# Patient Record
Sex: Female | Born: 1935
Health system: Southern US, Community
[De-identification: ages and names within clinical notes are randomized; demographics above are authoritative.]

## PROBLEM LIST (undated history)

## (undated) DIAGNOSIS — R5381 Other malaise: Secondary | ICD-10-CM

## (undated) DIAGNOSIS — M25562 Pain in left knee: Secondary | ICD-10-CM

## (undated) DIAGNOSIS — N393 Stress incontinence (female) (male): Secondary | ICD-10-CM

## (undated) DIAGNOSIS — I998 Other disorder of circulatory system: Secondary | ICD-10-CM

## (undated) DIAGNOSIS — S91009A Unspecified open wound, unspecified ankle, initial encounter: Secondary | ICD-10-CM

## (undated) DIAGNOSIS — H3530 Unspecified macular degeneration: Secondary | ICD-10-CM

## (undated) DIAGNOSIS — L821 Other seborrheic keratosis: Secondary | ICD-10-CM

## (undated) DIAGNOSIS — R5383 Other fatigue: Secondary | ICD-10-CM

## (undated) DIAGNOSIS — S2249XA Multiple fractures of ribs, unspecified side, initial encounter for closed fracture: Secondary | ICD-10-CM

## (undated) DIAGNOSIS — M25511 Pain in right shoulder: Secondary | ICD-10-CM

## (undated) DIAGNOSIS — S81009A Unspecified open wound, unspecified knee, initial encounter: Secondary | ICD-10-CM

## (undated) DIAGNOSIS — C50919 Malignant neoplasm of unspecified site of unspecified female breast: Secondary | ICD-10-CM

## (undated) DIAGNOSIS — R269 Unspecified abnormalities of gait and mobility: Secondary | ICD-10-CM

## (undated) DIAGNOSIS — S2242XA Multiple fractures of ribs, left side, initial encounter for closed fracture: Secondary | ICD-10-CM

## (undated) DIAGNOSIS — I8390 Asymptomatic varicose veins of unspecified lower extremity: Secondary | ICD-10-CM

## (undated) DIAGNOSIS — S4991XA Unspecified injury of right shoulder and upper arm, initial encounter: Secondary | ICD-10-CM

## (undated) DIAGNOSIS — M25559 Pain in unspecified hip: Secondary | ICD-10-CM

## (undated) DIAGNOSIS — S81809A Unspecified open wound, unspecified lower leg, initial encounter: Secondary | ICD-10-CM

## (undated) DIAGNOSIS — R739 Hyperglycemia, unspecified: Secondary | ICD-10-CM

## (undated) DIAGNOSIS — W19XXXA Unspecified fall, initial encounter: Secondary | ICD-10-CM

## (undated) DIAGNOSIS — D493 Neoplasm of unspecified behavior of breast: Secondary | ICD-10-CM

## (undated) DIAGNOSIS — E039 Hypothyroidism, unspecified: Secondary | ICD-10-CM

## (undated) DIAGNOSIS — E785 Hyperlipidemia, unspecified: Secondary | ICD-10-CM

## (undated) DIAGNOSIS — M2550 Pain in unspecified joint: Secondary | ICD-10-CM

## (undated) DIAGNOSIS — I10 Essential (primary) hypertension: Secondary | ICD-10-CM

## (undated) DIAGNOSIS — B029 Zoster without complications: Secondary | ICD-10-CM

## (undated) DIAGNOSIS — I80299 Phlebitis and thrombophlebitis of other deep vessels of unspecified lower extremity: Secondary | ICD-10-CM

## (undated) DIAGNOSIS — S42209A Unspecified fracture of upper end of unspecified humerus, initial encounter for closed fracture: Secondary | ICD-10-CM

## (undated) DIAGNOSIS — M79606 Pain in leg, unspecified: Secondary | ICD-10-CM

## (undated) HISTORY — DX: Zoster without complications: B02.9

## (undated) HISTORY — DX: Multiple fractures of ribs, left side, initial encounter for closed fracture: S22.42XA

## (undated) HISTORY — DX: Multiple fractures of ribs, unspecified side, initial encounter for closed fracture: S22.49XA

## (undated) HISTORY — DX: Hyperglycemia, unspecified: R73.9

## (undated) HISTORY — DX: Unspecified injury of right shoulder and upper arm, initial encounter: S49.91XA

## (undated) HISTORY — DX: Asymptomatic varicose veins of unspecified lower extremity: I83.90

## (undated) HISTORY — DX: Other seborrheic keratosis: L82.1

## (undated) HISTORY — DX: Unspecified fracture of upper end of unspecified humerus, initial encounter for closed fracture: S42.209A

## (undated) HISTORY — DX: Pain in right shoulder: M25.511

## (undated) HISTORY — DX: Other malaise: R53.81

## (undated) HISTORY — DX: Phlebitis and thrombophlebitis of other deep vessels of unspecified lower extremity: I80.299

## (undated) HISTORY — DX: Other disorder of circulatory system: I99.8

## (undated) HISTORY — DX: Unspecified open wound, unspecified knee, initial encounter: S81.009A

## (undated) HISTORY — DX: Essential (primary) hypertension: I10

## (undated) HISTORY — DX: Pain in leg, unspecified: M79.606

## (undated) HISTORY — DX: Unspecified fall, initial encounter: W19.XXXA

## (undated) HISTORY — DX: Malignant neoplasm of unspecified site of unspecified female breast: C50.919

## (undated) HISTORY — DX: Pain in unspecified joint: M25.50

## (undated) HISTORY — DX: Unspecified abnormalities of gait and mobility: R26.9

## (undated) HISTORY — DX: Hyperlipidemia, unspecified: E78.5

## (undated) HISTORY — PX: OTHER SURGICAL HISTORY: SHX169

## (undated) HISTORY — DX: Unspecified macular degeneration: H35.30

## (undated) HISTORY — DX: Pain in unspecified hip: M25.559

## (undated) HISTORY — DX: Neoplasm of unspecified behavior of breast: D49.3

## (undated) HISTORY — DX: Stress incontinence (female) (male): N39.3

## (undated) HISTORY — DX: Unspecified open wound, unspecified ankle, initial encounter: S91.009A

## (undated) HISTORY — DX: Unspecified open wound, unspecified lower leg, initial encounter: S81.809A

## (undated) HISTORY — DX: Hypothyroidism, unspecified: E03.9

## (undated) HISTORY — DX: Pain in left knee: M25.562

## (undated) HISTORY — DX: Other fatigue: R53.83

---
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
## 1965-06-10 HISTORY — PX: ABDOMINAL HYSTERECTOMY: SHX81

## 1965-06-10 HISTORY — PX: VESICOVAGINAL FISTULA CLOSURE W/ TAH: SUR271

## 1987-06-11 HISTORY — PX: OTHER SURGICAL HISTORY: SHX169

## 1997-06-10 HISTORY — PX: OTHER SURGICAL HISTORY: SHX169

## 1999-05-14 ENCOUNTER — Encounter: Admission: RE | Admit: 1999-05-14 | Discharge: 1999-05-14 | Payer: Self-pay | Admitting: Internal Medicine

## 1999-05-14 ENCOUNTER — Encounter: Payer: Self-pay | Admitting: Internal Medicine

## 2000-04-14 ENCOUNTER — Encounter: Payer: Self-pay | Admitting: Internal Medicine

## 2000-04-14 ENCOUNTER — Ambulatory Visit (HOSPITAL_COMMUNITY): Admission: RE | Admit: 2000-04-14 | Discharge: 2000-04-14 | Payer: Self-pay | Admitting: Internal Medicine

## 2001-04-20 ENCOUNTER — Ambulatory Visit (HOSPITAL_COMMUNITY): Admission: RE | Admit: 2001-04-20 | Discharge: 2001-04-20 | Payer: Self-pay | Admitting: Internal Medicine

## 2001-04-20 ENCOUNTER — Encounter: Payer: Self-pay | Admitting: Internal Medicine

## 2002-03-31 DIAGNOSIS — B029 Zoster without complications: Secondary | ICD-10-CM

## 2002-03-31 HISTORY — DX: Zoster without complications: B02.9

## 2002-04-14 ENCOUNTER — Encounter: Payer: Self-pay | Admitting: Internal Medicine

## 2002-04-14 ENCOUNTER — Encounter: Admission: RE | Admit: 2002-04-14 | Discharge: 2002-04-14 | Payer: Self-pay | Admitting: Internal Medicine

## 2003-05-23 ENCOUNTER — Ambulatory Visit (HOSPITAL_COMMUNITY): Admission: RE | Admit: 2003-05-23 | Discharge: 2003-05-23 | Payer: Self-pay | Admitting: Internal Medicine

## 2004-02-10 DIAGNOSIS — N393 Stress incontinence (female) (male): Secondary | ICD-10-CM

## 2004-02-10 HISTORY — DX: Stress incontinence (female) (male): N39.3

## 2004-04-10 HISTORY — PX: CATARACT EXTRACTION: SUR2

## 2004-05-29 ENCOUNTER — Ambulatory Visit (HOSPITAL_COMMUNITY): Admission: RE | Admit: 2004-05-29 | Discharge: 2004-05-29 | Payer: Self-pay | Admitting: Internal Medicine

## 2005-01-13 DIAGNOSIS — S42209A Unspecified fracture of upper end of unspecified humerus, initial encounter for closed fracture: Secondary | ICD-10-CM

## 2005-01-13 HISTORY — DX: Unspecified fracture of upper end of unspecified humerus, initial encounter for closed fracture: S42.209A

## 2005-05-06 ENCOUNTER — Encounter: Admission: RE | Admit: 2005-05-06 | Discharge: 2005-05-06 | Payer: Self-pay | Admitting: Internal Medicine

## 2005-07-26 ENCOUNTER — Ambulatory Visit (HOSPITAL_COMMUNITY): Admission: RE | Admit: 2005-07-26 | Discharge: 2005-07-26 | Payer: Self-pay | Admitting: Internal Medicine

## 2006-08-05 ENCOUNTER — Ambulatory Visit: Payer: Self-pay | Admitting: Gastroenterology

## 2006-08-13 ENCOUNTER — Ambulatory Visit (HOSPITAL_COMMUNITY): Admission: RE | Admit: 2006-08-13 | Discharge: 2006-08-13 | Payer: Self-pay | Admitting: Internal Medicine

## 2006-09-23 ENCOUNTER — Ambulatory Visit: Payer: Self-pay | Admitting: Gastroenterology

## 2006-10-06 ENCOUNTER — Ambulatory Visit: Payer: Self-pay | Admitting: Gastroenterology

## 2006-10-06 ENCOUNTER — Encounter (INDEPENDENT_AMBULATORY_CARE_PROVIDER_SITE_OTHER): Payer: Self-pay | Admitting: *Deleted

## 2006-11-11 ENCOUNTER — Ambulatory Visit: Payer: Self-pay | Admitting: Gastroenterology

## 2007-09-11 ENCOUNTER — Ambulatory Visit (HOSPITAL_COMMUNITY): Admission: RE | Admit: 2007-09-11 | Discharge: 2007-09-11 | Payer: Self-pay | Admitting: Internal Medicine

## 2007-10-31 DIAGNOSIS — D126 Benign neoplasm of colon, unspecified: Secondary | ICD-10-CM

## 2007-10-31 DIAGNOSIS — R413 Other amnesia: Secondary | ICD-10-CM | POA: Insufficient documentation

## 2007-10-31 DIAGNOSIS — M810 Age-related osteoporosis without current pathological fracture: Secondary | ICD-10-CM | POA: Insufficient documentation

## 2007-10-31 DIAGNOSIS — I10 Essential (primary) hypertension: Secondary | ICD-10-CM | POA: Insufficient documentation

## 2007-10-31 DIAGNOSIS — K5730 Diverticulosis of large intestine without perforation or abscess without bleeding: Secondary | ICD-10-CM | POA: Insufficient documentation

## 2007-10-31 DIAGNOSIS — C50919 Malignant neoplasm of unspecified site of unspecified female breast: Secondary | ICD-10-CM

## 2008-10-10 ENCOUNTER — Ambulatory Visit (HOSPITAL_COMMUNITY): Admission: RE | Admit: 2008-10-10 | Discharge: 2008-10-10 | Payer: Self-pay | Admitting: Internal Medicine

## 2009-03-04 HISTORY — PX: MOLE REMOVAL: SHX2046

## 2009-10-08 DIAGNOSIS — M25511 Pain in right shoulder: Secondary | ICD-10-CM

## 2009-10-08 HISTORY — DX: Pain in right shoulder: M25.511

## 2009-11-27 ENCOUNTER — Ambulatory Visit (HOSPITAL_COMMUNITY): Admission: RE | Admit: 2009-11-27 | Discharge: 2009-11-27 | Payer: Self-pay | Admitting: Internal Medicine

## 2009-12-08 DIAGNOSIS — S4991XA Unspecified injury of right shoulder and upper arm, initial encounter: Secondary | ICD-10-CM

## 2009-12-08 HISTORY — DX: Unspecified injury of right shoulder and upper arm, initial encounter: S49.91XA

## 2010-10-23 NOTE — Assessment & Plan Note (Signed)
 HEALTHCARE                         GASTROENTEROLOGY OFFICE NOTE   NAME:Garrett, Tara HOWALD                      MRN:          [id number]  DATE:11/11/2006                            DOB:          Aug 07, 1922    Tara Garrett was found to have microscopic-lymphocytic colitis at the time  of her colonoscopy.  She has had a remarkable response to Entocort 9 mg  a day and is completely asymptomatic at this time.  She had been out of  Entocort for the last few days and is having no problems.  I have  advised her to stop her Entocort and let us see how she does  symptomatically but there is a good chance she may relapse and need to  be on a lower dose of Entocort.  She has also been taking Align as a  probiotic which she can use as needed.  I told her to avoid NSAIDs and  other anti-inflammatories.  She is on a variety of other medications  listed and reviewed in her chart which I have okayed for her to take at  this time including aspirin 325 mg three times a week.     Vania Rea. Jarold Motto, MD, Caleen Essex, FAGA  Electronically Signed    DRP/MedQ  DD: 11/11/2006  DT: 11/11/2006  Job #: [id number]   cc:   Lenon Curt. Chilton Si, M.D.

## 2010-10-26 NOTE — Assessment & Plan Note (Signed)
[facility]                         GASTROENTEROLOGY OFFICE NOTE   NAME:Garrett, Tara KOZICKI                      MRN:          [id number]  DATE:08/05/2006                            DOB:          10-13-1922    [name] is really having no GI complaints except for an occasional  loose stool every 2 to 3 days.  She is traveling extensively, and had an  upper respiratory infection earlier this month, which resolved on its  own.  She is followed by Dr. Frederik Pear, and had recent evaluation  including CBC and metabolic profile, which were normal.  She denies any  rectal bleeding or upper GI hepatobiliary complaints.  She does have a  past history of colonic polyposis with her last colonoscopy in 2000.  She also has extensive left colon diverticulosis.   She is awake, alert, in no acute distress.  She weighs 125 pounds.  Blood pressure is 120/72.  Pulse is 60 and  regular.  CHEST:  Was clear.  I could not appreciate murmurs, gallops or rubs.  She was in a regular rhythm.  There is hepatosplenomegaly, abdominal  masses, or tenderness.  RECTAL:  Exam was deferred.   ASSESSMENT:  1. Diverticulosis with slight bowel irregularity, probably related to      dysmotility of her sigmoid colon.  2. Need for followup colonoscopy.  3. History of osteoporosis, on Fosamax.  4. Patient takes aspirin therapy for prophylaxis, but has had no      cardiovascular or neurologic issues that I am aware.   RECOMMENDATIONS:  1. Trial of Align probiotics for 2 weeks.  2. High-fiber diet as tolerated with fiber supplements as needed.  3. Outpatient colonoscopy followup off salicylate therapy.     Vania Rea. Jarold Motto, MD, Caleen Essex, FAGA  Electronically Signed    DRP/MedQ  DD: 08/05/2006  DT: 08/05/2006  Job #: [id number]   cc:   Lenon Curt Chilton Si, M.D.

## 2010-11-08 ENCOUNTER — Other Ambulatory Visit: Payer: Self-pay | Admitting: Internal Medicine

## 2010-11-08 DIAGNOSIS — Z1231 Encounter for screening mammogram for malignant neoplasm of breast: Secondary | ICD-10-CM

## 2010-12-24 DIAGNOSIS — S81009A Unspecified open wound, unspecified knee, initial encounter: Secondary | ICD-10-CM

## 2010-12-24 HISTORY — DX: Unspecified open wound, unspecified knee, initial encounter: S81.009A

## 2011-02-28 ENCOUNTER — Ambulatory Visit (HOSPITAL_COMMUNITY)
Admission: RE | Admit: 2011-02-28 | Discharge: 2011-02-28 | Disposition: A | Discharge status: Home / Self Care | Payer: Medicare Other | Source: Ambulatory Visit | Attending: Internal Medicine | Admitting: Internal Medicine

## 2011-02-28 DIAGNOSIS — Z780 Asymptomatic menopausal state: Secondary | ICD-10-CM | POA: Insufficient documentation

## 2011-02-28 DIAGNOSIS — Z13820 Encounter for screening for osteoporosis: Secondary | ICD-10-CM | POA: Insufficient documentation

## 2011-02-28 DIAGNOSIS — Z1231 Encounter for screening mammogram for malignant neoplasm of breast: Secondary | ICD-10-CM

## 2011-09-12 ENCOUNTER — Other Ambulatory Visit: Payer: Self-pay

## 2011-09-12 DIAGNOSIS — I83893 Varicose veins of bilateral lower extremities with other complications: Secondary | ICD-10-CM

## 2011-10-02 ENCOUNTER — Encounter: Payer: Self-pay | Admitting: Vascular Surgery

## 2011-10-09 DIAGNOSIS — W19XXXA Unspecified fall, initial encounter: Secondary | ICD-10-CM

## 2011-10-09 DIAGNOSIS — I998 Other disorder of circulatory system: Secondary | ICD-10-CM

## 2011-10-09 HISTORY — DX: Other disorder of circulatory system: I99.8

## 2011-10-09 HISTORY — DX: Unspecified fall, initial encounter: W19.XXXA

## 2011-10-10 ENCOUNTER — Ambulatory Visit (INDEPENDENT_AMBULATORY_CARE_PROVIDER_SITE_OTHER): Payer: Medicare Other | Admitting: Family Medicine

## 2011-10-10 ENCOUNTER — Encounter: Payer: Self-pay | Admitting: Surgery

## 2011-10-10 ENCOUNTER — Ambulatory Visit: Payer: Medicare Other

## 2011-10-10 VITALS — BP 138/89 | HR 68 | Temp 97.5°F | Resp 20 | Ht <= 58 in | Wt 134.8 lb

## 2011-10-10 DIAGNOSIS — S0083XA Contusion of other part of head, initial encounter: Secondary | ICD-10-CM

## 2011-10-10 DIAGNOSIS — R233 Spontaneous ecchymoses: Secondary | ICD-10-CM

## 2011-10-10 DIAGNOSIS — M25519 Pain in unspecified shoulder: Secondary | ICD-10-CM

## 2011-10-10 DIAGNOSIS — R071 Chest pain on breathing: Secondary | ICD-10-CM

## 2011-10-10 DIAGNOSIS — S0003XA Contusion of scalp, initial encounter: Secondary | ICD-10-CM

## 2011-10-10 DIAGNOSIS — M2550 Pain in unspecified joint: Secondary | ICD-10-CM

## 2011-10-10 DIAGNOSIS — R0789 Other chest pain: Secondary | ICD-10-CM

## 2011-10-10 MED ORDER — TRAMADOL HCL 50 MG PO TABS: 50.0000 mg | ORAL_TABLET | Freq: Three times a day (TID) | ORAL | Status: AC | PRN

## 2011-10-10 NOTE — Patient Instructions (Signed)
Take caution to avoid further falls. Consider using a cane.  Breasts the right shoulder by wearing a sling. If you continue to hurt a lot by next week see orthopedist back. Can continue using the ibuprofen, but if the pain is too bad use tramadol.  2 the blood pressure carefully.

## 2011-10-10 NOTE — Progress Notes (Signed)
Subjective: 78 year old lady who was taking something in the car and caught her foot at the 1/2 inch level difference in the sidewalk and driveway. She fell forward. This happened yesterday about noon. She hit her right forehead and got an immediate goose egg there. She went inside and put ice to it. She had a little difficulty getting up, had to get off of the concrete onto the grass before she can get herself up. She had no loss of consciousness. She went out with friends last night and was able to drive her own vehicle. However this morning she had more shoulder pain in the right shoulder and right lateral chest wall pain. She said her shoulder hurt too much for her to turn the key in ignition or shift gear shift.    She has a history of having had a period unstable right shoulder. Apparently it is completely worn out and the surgeon wants to grab some bone in there so that he can then do a shoulder implanter repair. She has declined having that done. She has had a couple of dislocations of the right shoulder.  Objective: Large hematoma bulge on the right temple. She has ecchymosis of the forehead, around the orbit, and down to the jaw off the right cheek. Neck is supple. Her chest is clear. Right chest wall is tender about the 8 to the 12th rib area to the posterior axillary line. Her right shoulder is very tenderness crepitance of the shoulder she cannot move it without pain. The patient is fully alert and oriented. Eyes PERRLA. Not tender around the orbit itself.  Assessment: Closed head injury Right forehead contusion Right shoulder injury Old DJD right shoulder Ecchymosis right forearm Right chest wall pain  Plan: She is so alert and oriented I do not feel like there is any need to be working up the intracranial risks. Cautioned her about neurologic signs to watch for. Her friend is with her.  Ordered x-ray of shoulder and rib cage.  [facility] reading (PRIMARY) by  Dr. Sofie Rower does not  reveal any fractured ribs. Shoulder x-ray shows some degenerative changes in the shoulder, osteoporosis, but no foot definite fracture.   . She has a sling at home she will use. Suggested she get a cane for her left hand to stabilize her while her right hand is in the sling. If her arm continues to bother her she is to see her orthopedic doctor back. Tramadol for pain.

## 2011-10-14 ENCOUNTER — Ambulatory Visit (INDEPENDENT_AMBULATORY_CARE_PROVIDER_SITE_OTHER): Payer: Medicare Other | Admitting: Surgery

## 2011-10-14 ENCOUNTER — Encounter: Payer: Self-pay | Admitting: Surgery

## 2011-10-14 ENCOUNTER — Other Ambulatory Visit: Payer: Self-pay | Admitting: *Deleted

## 2011-10-14 ENCOUNTER — Ambulatory Visit (INDEPENDENT_AMBULATORY_CARE_PROVIDER_SITE_OTHER): Payer: Medicare Other | Admitting: Vascular Surgery

## 2011-10-14 VITALS — BP 216/66 | HR 59 | Resp 18 | Ht 59.0 in | Wt 133.8 lb

## 2011-10-14 DIAGNOSIS — I83893 Varicose veins of bilateral lower extremities with other complications: Secondary | ICD-10-CM

## 2011-10-14 DIAGNOSIS — M79609 Pain in unspecified limb: Secondary | ICD-10-CM

## 2011-10-14 DIAGNOSIS — I8310 Varicose veins of unspecified lower extremity with inflammation: Secondary | ICD-10-CM | POA: Insufficient documentation

## 2011-10-14 DIAGNOSIS — I824Z9 Acute embolism and thrombosis of unspecified deep veins of unspecified distal lower extremity: Secondary | ICD-10-CM

## 2011-10-14 NOTE — Progress Notes (Signed)
[facility]   Patient name: Tara Garrett MRN: [id number] DOB: December 01, 1922 Sex: female   Referred by: Dr. Chilton Si  Reason for referral:  Chief Complaint  Patient presents with  . New Evaluation    hx of bleeding from VV left calf 3 episodes  latest episode 3 weeks ago    HISTORY OF PRESENT ILLNESS: The patient comes in today for evaluation of bleeding veins in her left leg. She states that her first episode was back in September of 2012. She has had 2 further episodes the most recent of which was 2 weeks ago. She recently had a fall where she suffered severe ecchymosis to the right side of her face. Because of bruising on her face as well as bleeding from her pain in her left leg she is now taking only aspirin once a week. She denies having any problems with claudication. She denies any swelling or leg. Otherwise, she is a very active and healthy 78 year old female.  Past Medical History  Diagnosis Date  . Varicose veins   . Hypothyroidism   . Vitamin d deficiency   . Hyperlipidemia   . Hypertension   . Shoulder pain, right 10/2009    X- rayed at Dr. Luiz Blare.Severe Degenerative Disease. Recommended an artificial shoulder. Saw Dr. Ave Filter .  Marland Kitchen Right shoulder injury 12/2009  . Leg pain   . Hyperglycemia   . Macular degeneration   . Keratosis seborrheica   . Left knee pain   . Osteoporosis   . Neoplasm of breast     right breast cancer  . Fall 10-09-2011    fell outdoors on driveway fell and hit right side of body and face    Past Surgical History  Procedure Date  . Left cataract 1999  . Right mastectomy 1989  . Vesicovaginal fistula closure w/ tah 1967  . Colon polyps 1989, 1992, 1995, 1997  . Cataract extraction 04/2004    Dr. Hazle Quant  . Mole removal 03/04/2009    on back by Dr. Lovenia Kim    History   Social History  . Marital Status: Single    Spouse Name: N/A    Number of Children: N/A  . Years of Education: N/A   Occupational  History  . Not on file.   Social History Main Topics  . Smoking status: Former Smoker    Types: Cigarettes    Quit date: 06/10/1978  . Smokeless tobacco: Not on file  . Alcohol Use: 0.0 oz/week    0-1 drink(s) per week  . Drug Use: Not on file  . Sexually Active:    Other Topics Concern  . Not on file   Social History Narrative  . No narrative on file    Family History  Problem Relation Age of Onset  . Cancer Mother     Allergies as of 10/14/2011 - Review Complete 10/14/2011  Allergen Reaction Noted  . Mobic (meloxicam)  10/10/2011  . Tramadol  10/14/2011    Current Outpatient Prescriptions on File Prior to Visit  Medication Sig Dispense Refill  . aspirin 325 MG EC tablet Take 325 mg by mouth daily. On Tuesdays      . Calcium Carb-Cholecalciferol (CALCIUM 500 +D) 500-400 MG-UNIT TABS Take by mouth daily.      . cholecalciferol (VITAMIN D) 1000 UNITS tablet Take 1,000 Units by mouth daily.      . fish oil-omega-3 fatty acids 1000 MG capsule Take 1 g by mouth daily.      Marland Kitchen  hydrochlorothiazide (HYDRODIURIL) 25 MG tablet Take 25 mg by mouth daily.      Marland Kitchen ibuprofen (ADVIL,MOTRIN) 200 MG tablet Take 200 mg by mouth 4 (four) times daily. Two tablets at breakfast and two tablets after dinner      . metoprolol tartrate (LOPRESSOR) 25 MG tablet Take 25 mg by mouth daily.      . Multiple Vitamin (MULTIVITAMIN) tablet Take 1 tablet by mouth daily.      . Multiple Vitamins-Minerals (PROTEGRA PO) Take by mouth daily. Alternating with I Caps      . traMADol (ULTRAM) 50 MG tablet Take 1 tablet (50 mg total) by mouth every 8 (eight) hours as needed for pain.  20 tablet  0     REVIEW OF SYSTEMS: Positive for varicose veins and bleeding problems, recent fall. All other systems are negative as documented by the patient  PHYSICAL EXAMINATION: General: The patient appears their stated age.  Vital signs are BP 216/66  Pulse 59  Resp 18  Ht 4\' 11"  (1.499 m)  Wt 133 lb 12.8 oz (60.691  kg)  BMI 27.02 kg/m2 HEENT:  No gross abnormalities Pulmonary: Respirations are non-labored Musculoskeletal: There are no major deformities.   Neurologic: No focal weakness or paresthesias are detected, Skin: There are no ulcer or rashes noted. Psychiatric: The patient has normal affect. Cardiovascular: Scattered spider veins throughout both lower extremities. Stigmata of recent bleed and a spider vein on the left posterior calf.  Diagnostic Studies: Reflux was documented today in the venous ultrasound in both greater saphenous veins. The patient was also found to have an acute DVT in a gastroc vein which was nonocclusive    Assessment:  Bleeding vein, lower extremity Plan: #1: With regards to bleeding spider vein, I think the first place to start is to put her in compression stockings. She will be given 20-30 mm thigh-high compression stockings today. She is potentially a candidate for laser vein ablation of the left greater saphenous, given her history of frequent bleeding episodes. She'll also likely require sclerotherapy of the offending vein. I'm going to have her come back in 3 months to have this further evaluated  #2: The patient has an acute DVT within one of her gastrocnemius veins. This was nonocclusive. Because of the patient's recent fall as well as bleeding varicose vein I am reluctant to start her on Coumadin at this time. I am going to bring her back in one week to see if there has been any propagation of her clots. As long as the tibial vessels as well as the femoral popliteal vessels remain patent without thrombus I think it is reasonable to not anticoagulate her for this problem.     Jorge Ny, M.D. [facility] Office: [phone] Pager:  [phone]

## 2011-10-16 ENCOUNTER — Telehealth: Payer: Self-pay | Admitting: Family Medicine

## 2011-10-16 NOTE — Telephone Encounter (Signed)
Spoke with patient and told her 2 rib fx.  She is doing satisfactorily.

## 2011-10-18 ENCOUNTER — Other Ambulatory Visit: Payer: Self-pay | Admitting: *Deleted

## 2011-10-18 DIAGNOSIS — I82409 Acute embolism and thrombosis of unspecified deep veins of unspecified lower extremity: Secondary | ICD-10-CM

## 2011-10-21 ENCOUNTER — Encounter (INDEPENDENT_AMBULATORY_CARE_PROVIDER_SITE_OTHER): Payer: Medicare Other | Admitting: *Deleted

## 2011-10-21 DIAGNOSIS — Z86718 Personal history of other venous thrombosis and embolism: Secondary | ICD-10-CM

## 2011-10-21 DIAGNOSIS — M7989 Other specified soft tissue disorders: Secondary | ICD-10-CM

## 2011-10-21 DIAGNOSIS — I82409 Acute embolism and thrombosis of unspecified deep veins of unspecified lower extremity: Secondary | ICD-10-CM

## 2011-11-25 NOTE — Procedures (Unsigned)
LOWER EXTREMITY VENOUS REFLUX EXAM  INDICATION:  Varicose veins and lower extremity pain.  EXAM:  Using color-flow imaging and pulse Doppler spectral analysis, the bilateral common femoral, femoral, popliteal, posterior tibial, great and small saphenous veins were evaluated.  There is evidence suggesting deep venous insufficiency in the bilateral lower extremities.  The bilateral saphenofemoral junctions are competent.  The bilateral great saphenous veins are not competent with Reflux of >575milliseconds with the caliber as described below.   The bilateral proximal small saphenous veins demonstrate competency.  GSV Diameter (used if found to be incompetent only)                                                   Right     Left Proximal Greater Saphenous Vein                   0.49 cm   0.56 cm Proximal-to-mid-thigh                             0.20 cm   0.38 cm Mid thigh                                         0.28 cm   0.32 cm Mid-distal thigh                                  cm        cm Distal thigh                                      0.35 cm   0.16 cm Knee                                              0.32 cm   0.31 cm  IMPRESSION: 1. Bilateral great saphenous veins are not competent with Reflux of     >548milliseconds. 2. The bilateral great saphenous veins are not tortuous. 3. The deep venous system of the bilateral lower extremities is not     competent with Reflux of >534milliseconds. 4. The bilateral small saphenous veins are competent. 5. Acute non-occluding deep vein thrombosis present involving 1 of 2     left gastrocnemius veins in the proximal calf.      ___________________________________________ V. Charlena Cross, MD  SH/MEDQ  D:  10/14/2011  T:  10/14/2011  Job:  [id number]

## 2011-11-25 NOTE — Procedures (Unsigned)
DUPLEX DEEP VENOUS EXAM - LOWER EXTREMITY  INDICATION:  Follow up left lower extremity DVT in the left gastrocnemius vein.  HISTORY:  Edema:  No Trauma/Surgery: Pain:  No PE: Previous DVT:  Found 10/14/2011 in the left gastrocnemius vein. Anticoagulants:  No Other:  DUPLEX EXAM:               CFV   SFV   PopV  PTV    GSV               R  L  R  L  R  L  R   L  R  L Thrombosis       0     0     0      0     0 Spontaneous      +     +     +      +     + Phasic           +     +     +      +     + Augmentation     +     +     +      +     + Compressible     +     +     +      +     + Competent        +     +     0      +     0  Legend:  + - yes  o - no  p - partial  D - decreased  IMPRESSION:  Acute nonoccluding deep venous thrombosis present in 1 of 2 gastrocnemius veins in the proximal calf, no change since 10/24/2011. All other deep veins appear patent and compressible with no evidence of deep venous thrombosis at this time.   _____________________________ V. Charlena Cross, MD  SS/MEDQ  D:  10/21/2011  T:  10/21/2011  Job:  [id number]

## 2011-11-26 ENCOUNTER — Encounter: Payer: Medicare Other | Admitting: Vascular Surgery

## 2012-01-06 DIAGNOSIS — R5383 Other fatigue: Secondary | ICD-10-CM

## 2012-01-06 DIAGNOSIS — R5381 Other malaise: Secondary | ICD-10-CM | POA: Insufficient documentation

## 2012-01-06 DIAGNOSIS — I80299 Phlebitis and thrombophlebitis of other deep vessels of unspecified lower extremity: Secondary | ICD-10-CM

## 2012-01-06 HISTORY — DX: Other fatigue: R53.83

## 2012-01-06 HISTORY — DX: Phlebitis and thrombophlebitis of other deep vessels of unspecified lower extremity: I80.299

## 2012-01-06 HISTORY — DX: Other malaise: R53.81

## 2012-01-20 ENCOUNTER — Encounter: Payer: Self-pay | Admitting: Vascular Surgery

## 2012-01-21 ENCOUNTER — Ambulatory Visit (INDEPENDENT_AMBULATORY_CARE_PROVIDER_SITE_OTHER): Payer: Medicare Other | Admitting: Vascular Surgery

## 2012-01-21 ENCOUNTER — Encounter: Payer: Self-pay | Admitting: Vascular Surgery

## 2012-01-21 VITALS — BP 192/85 | HR 111 | Resp 18 | Ht <= 58 in | Wt 133.5 lb

## 2012-01-21 DIAGNOSIS — I8310 Varicose veins of unspecified lower extremity with inflammation: Secondary | ICD-10-CM

## 2012-01-21 NOTE — Progress Notes (Signed)
Problems with Activities of Daily Living Secondary to Leg Pain  1. Mrs. Tara Garrett states unable to garden due to leg pain and bleeding episodes.  2. Mrs. Tara Garrett states that she is unable to perform any activities that require prolonged standing (cooking, cleaning) due to leg pain and bleeding episodes.    3. Mrs. Tara Garrett has stopped walking for exercise due to leg pain.  [name], [name]   Failure of  Conservative Therapy:  1. Worn 20-30 mm Hg thigh high compression hose >3 months with no relief of symptoms.  2. Frequently elevates legs-no relief of symptoms  3. Taken Ibuprofen 600 Mg TID with no relief of symptoms.  Patient has today for continued followup of her left leg venous hypertension. She fortunately has had no recent bleeding episodes. She has had 3 significant bleeds in the past year. She continues to have pain in her left calf despite use of graduated compression garments elevation ibuprofen. She still has these areas of telangiectasia which are very superficial and looked to be at risk for ongoing bleeding. I have recommended laser ablation of her left great saphenous vein for reduction of venous hypertension also sclerotherapy of the telangiectasia at the level of the prior bleed. We'll discuss her best long-term option for control of the recurrent bleed. She understands this is an outpatient procedure under local anesthesia and wished to proceed as soon as possible

## 2012-01-24 ENCOUNTER — Other Ambulatory Visit: Payer: Self-pay | Admitting: *Deleted

## 2012-01-24 DIAGNOSIS — I83893 Varicose veins of bilateral lower extremities with other complications: Secondary | ICD-10-CM

## 2012-01-29 ENCOUNTER — Encounter: Payer: Self-pay | Admitting: Vascular Surgery

## 2012-01-30 ENCOUNTER — Encounter: Payer: Self-pay | Admitting: Vascular Surgery

## 2012-01-30 ENCOUNTER — Ambulatory Visit (INDEPENDENT_AMBULATORY_CARE_PROVIDER_SITE_OTHER): Payer: Medicare Other | Admitting: Vascular Surgery

## 2012-01-30 VITALS — BP 183/93 | HR 99 | Resp 20 | Ht <= 58 in | Wt 134.0 lb

## 2012-01-30 DIAGNOSIS — I83893 Varicose veins of bilateral lower extremities with other complications: Secondary | ICD-10-CM | POA: Insufficient documentation

## 2012-01-30 HISTORY — PX: ENDOVENOUS ABLATION SAPHENOUS VEIN W/ LASER: SUR449

## 2012-01-30 NOTE — Progress Notes (Signed)
Laser Ablation Procedure      Date: 01/30/2012    Tara Garrett DOB:June 30, 1922  Consent signed: Yes  Surgeon:T.F. Velmer Broadfoot  Procedure: Laser Ablation: left Greater Saphenous Vein  BP 183/93  Pulse 99  Resp 20  Ht 4\' 10"  (1.473 m)  Wt 134 lb (60.782 kg)  BMI 28.01 kg/m2  Start time: 11:00AM   End time: 12:05PM  Tumescent Anesthesia: 325 cc 0.9% NaCl with 50 cc Lidocaine HCL with 1% Epi and 15 cc 8.4% NaHCO3  Local Anesthesia: 3 cc Lidocaine HCL and NaHCO3 (ratio 2:1)  Continuous Mode: 15 Watts Total Energy 2013 Joules Total Time2:14   Sclerotherapy: 0.3 %Sotradecol. Patient received a total of 2 cc    Patient tolerated procedure well: Yes  [name], [name]  Description of Procedure:  After marking the course of the saphenous vein and the secondary varicosities in the standing position, the patient was placed on the operating table in the supine position, and the left leg was prepped and draped in sterile fashion. Local anesthetic was administered, and under ultrasound guidance the saphenous vein was accessed with a micro needle and guide wire; then the micro puncture sheath was placed. A guide wire was inserted to the saphenofemoral junction, followed by a 5 french sheath.  The position of the sheath and then the laser fiber below the junction was confirmed using the ultrasound and visualization of the aiming beam.  Tumescent anesthesia was administered along the course of the saphenous vein using ultrasound guidance. Protective laser glasses were placed on the patient, and the laser was fired at at 15 watt continuous mode.  For a total of 2013 joules.  A steri strip was applied to the puncture site.    Sclerotherapy was performed to 4 perforator vessels using 2  cc .3% Sotradecol foam via a 30 gauge needle.  ABD pads and thigh high compression stockings were applied.  Ace wrap bandages were applied over the phlebectomy sites and at the top of the saphenofemoral junction.   Blood loss was less than 15 cc.  The patient ambulated out of the operating room having tolerated the procedure well.

## 2012-02-03 ENCOUNTER — Encounter: Payer: Self-pay | Admitting: Vascular Surgery

## 2012-02-04 ENCOUNTER — Telehealth: Payer: Self-pay | Admitting: *Deleted

## 2012-02-04 ENCOUNTER — Ambulatory Visit (INDEPENDENT_AMBULATORY_CARE_PROVIDER_SITE_OTHER): Payer: Medicare Other | Admitting: Vascular Surgery

## 2012-02-04 ENCOUNTER — Encounter: Payer: Self-pay | Admitting: Vascular Surgery

## 2012-02-04 VITALS — BP 190/84 | HR 84 | Resp 18 | Ht <= 58 in | Wt 133.5 lb

## 2012-02-04 DIAGNOSIS — I83893 Varicose veins of bilateral lower extremities with other complications: Secondary | ICD-10-CM

## 2012-02-04 DIAGNOSIS — M79609 Pain in unspecified limb: Secondary | ICD-10-CM

## 2012-02-04 DIAGNOSIS — M7989 Other specified soft tissue disorders: Secondary | ICD-10-CM

## 2012-02-04 NOTE — Progress Notes (Signed)
Patient has today for followup of her laser ablation of left great saphenous vein and sclerotherapy of telangiectasia which had bled at the level of her ankle on the lateral aspect. The procedure was 01/30/2012. She did extremely well following the procedure. She does have slightly more than the usual amount of bruising with her frail skin. She reports that she's had no discomfort. She has been wearing her compression garment as directed and is been active at her usual baseline.  Past Medical History  Diagnosis Date  . Varicose veins   . Hypothyroidism   . Vitamin d deficiency   . Hyperlipidemia   . Hypertension   . Shoulder pain, right 10/2009    X- rayed at Dr. Luiz Blare.Severe Degenerative Disease. Recommended an artificial shoulder. Saw Dr. Ave Filter .  Marland Kitchen Right shoulder injury 12/2009  . Leg pain   . Hyperglycemia   . Macular degeneration   . Keratosis seborrheica   . Left knee pain   . Osteoporosis   . Neoplasm of breast     right breast cancer  . Fall 10-09-2011    fell outdoors on driveway fell and hit right side of body and face    History  Substance Use Topics  . Smoking status: Former Smoker    Types: Cigarettes    Quit date: 06/10/1978  . Smokeless tobacco: Not on file  . Alcohol Use: 0.0 oz/week    0-1 drink(s) per week    Family History  Problem Relation Age of Onset  . Cancer Mother     Allergies  Allergen Reactions  . Mobic (Meloxicam)   . Tramadol     Current outpatient prescriptions:aspirin 325 MG EC tablet, Take 325 mg by mouth daily. On Tuesdays, Disp: , Rfl: ;  Calcium Carb-Cholecalciferol (CALCIUM 500 +D) 500-400 MG-UNIT TABS, Take by mouth daily., Disp: , Rfl: ;  cholecalciferol (VITAMIN D) 1000 UNITS tablet, Take 1,000 Units by mouth daily., Disp: , Rfl: ;  fish oil-omega-3 fatty acids 1000 MG capsule, Take 1 g by mouth daily., Disp: , Rfl:  hydrochlorothiazide (HYDRODIURIL) 25 MG tablet, Take 25 mg by mouth daily., Disp: , Rfl: ;  ibuprofen  (ADVIL,MOTRIN) 200 MG tablet, Take 200 mg by mouth 4 (four) times daily. Two tablets at breakfast and two tablets after dinner, Disp: , Rfl: ;  losartan (COZAAR) 50 MG tablet, Take 50 mg by mouth daily., Disp: , Rfl: ;  metoprolol tartrate (LOPRESSOR) 25 MG tablet, Take 25 mg by mouth daily., Disp: , Rfl:  Multiple Vitamin (MULTIVITAMIN) tablet, Take 1 tablet by mouth daily., Disp: , Rfl: ;  Multiple Vitamins-Minerals (PROTEGRA PO), Take by mouth daily. Alternating with I Caps, Disp: , Rfl:   BP 190/84  Pulse 84  Resp 18  Ht 4\' 10"  (1.473 m)  Wt 133 lb 8 oz (60.555 kg)  BMI 27.90 kg/m2  Body mass index is 27.90 kg/(m^2).       Physical exam 2+ dorsalis pedis pulse on the left does have some bruising on the medial calf extending up towards her groin on her medial thigh. The area of injection of the telangiectasia at her ankle has a thrombosis of the telangiectasia and the areas that have bled specifically.  Venous duplex today reveals closure of her great saphenous vein from the knee to just below the saphenofemoral junction and no evidence of DVT  Impression and plan: Successful laser ablation and sclerotherapy of bleeding telangiectasia. She will wear her compression garment for an additional one week. We will  see her again on an as-needed basis.

## 2012-02-04 NOTE — Telephone Encounter (Signed)
02/04/2012  Time: 9:07 AM   Patient Name: Tara Garrett  Patient of: T.F. Early  Procedure:Laser Ablation left greater saphenous vein and sclerotherapy left leg 01-30-2012  Reached patient at home and checked  Her status  Yes    Comments/Actions Taken: Ms. Donley states no problems with pain or swelling.  Slept she slept very well last night!  Reviewed post procedural instructions and reminded him of post procedure duplex and follow up with Dr. Arbie Cookey on 02-04-2012.  Talena Neira, [name]    @SIGNATURE @

## 2012-02-04 NOTE — Progress Notes (Signed)
Left lower extremity venous post ablation duplex performed @ [facility] 02/04/2012

## 2012-05-11 DIAGNOSIS — M25559 Pain in unspecified hip: Secondary | ICD-10-CM

## 2012-05-11 HISTORY — DX: Pain in unspecified hip: M25.559

## 2012-06-10 DIAGNOSIS — I8390 Asymptomatic varicose veins of unspecified lower extremity: Secondary | ICD-10-CM

## 2012-06-10 HISTORY — DX: Asymptomatic varicose veins of unspecified lower extremity: I83.90

## 2012-12-09 ENCOUNTER — Encounter: Payer: Self-pay | Admitting: Geriatric Medicine

## 2012-12-09 ENCOUNTER — Non-Acute Institutional Stay: Payer: Medicare Other | Admitting: Geriatric Medicine

## 2012-12-09 VITALS — BP 120/74 | HR 88 | Temp 96.7°F | Ht <= 58 in | Wt 132.0 lb

## 2012-12-09 DIAGNOSIS — S91001A Unspecified open wound, right ankle, initial encounter: Secondary | ICD-10-CM

## 2012-12-09 DIAGNOSIS — S81009A Unspecified open wound, unspecified knee, initial encounter: Secondary | ICD-10-CM | POA: Insufficient documentation

## 2012-12-09 DIAGNOSIS — S81001A Unspecified open wound, right knee, initial encounter: Secondary | ICD-10-CM

## 2012-12-09 NOTE — Assessment & Plan Note (Addendum)
Wound right lower anterior leg skin tear and bruising 2 weeks ago now with signs of mild edema and inflammation. There is no sign of gross infection in the wound. Recommend patient elevate leg apply ice. This inflammation will resolve as the wound heals.

## 2012-12-09 NOTE — Progress Notes (Signed)
Patient ID: Tara Garrett, female   DOB: Sep 19, 1922, 79 y.o.   MRN: [id number] [facility] [id number])  Chief Complaint  Patient presents with  . Wound Check    patient fell 2 weeks ago. Wound on right lower leg was healing. Clinic nurse noticed it today the wound open and reddness down to ankle.     HPI: This is a 79 y.o. female resident of [facility],  Independent Living  section.  Evaluation is requested today due to change in leg wound.  Patient fell outdoors approximately 2 weeks ago sustained a large bruise and skin tear to her right lower anterior leg. Clinic nurse has been following this wound with some every other day dressing changes. It appeared to have been healing up for a well however nurse noted today the wound was open, leg red down to her ankle. Patient reports she is taking penicillin 500 mg twice a day for 2 weeks as prescriber her dentist for a gum infection.  Allergies  Allergies  Allergen Reactions  . Mobic (Meloxicam)   . Tramadol    Medications  Reviewed  Review of Systems  DATA OBTAINED: from patient,  GENERAL: Feels well  No fevers, fatigue, change in activity status, appetite, or weight EENT: No mouth pain or sore throat MUSCULOSKELETAL: Hips feel stiff, difficult to bend over  No back pain   No muscle ache, pain, weakness   Gait is steady   NEUROLOGIC: No dizziness, fainting, headache No change in mental status  PSYCHIATRIC: No feelings of anxiety, depression  Sleeps well     Physical Exam Filed Vitals:   12/09/12 1530  BP: 120/74  Pulse: 88  Temp: 96.7 F (35.9 C)  TempSrc: Oral  Height: 4\' 10"  (1.473 m)  Weight: 132 lb (59.875 kg)   Body mass index is 27.6 kg/(m^2).  GENERAL APPEARANCE: No acute distress, appropriately groomed, normal body habitus   Alert, pleasant, conversant. HEAD: Normocephalic, atraumatic EYES: Conjunctiva/lids clear  RESPIRATORY: Breathing is even, unlabored   MUSCULOSKELETAL.   Gait is steady   Right lower anterior leg is reddened and mildly edematous. There is a large area of ecchymosis with central skin tear. The wound is clean. Periwound area is mildly tender. It is not hot to touch. PSYCHIATRIC: Mood and affect appropriate to situation   ASSESSMENT/PLAN  Open wound of knee, leg (except thigh), and ankle, complicated Wound right lower anterior leg skin tear and bruising 2 weeks ago now with signs of mild edema and inflammation. There is no sign of gross infection in the wound. Recommend patient elevate leg apply ice. This inflammation will resolve as the wound heals.   Follow up: Keep scheduled appt w/Dr.Green  Rollen Selders T.Lincoln Ginley, NP-C 12/09/2012

## 2012-12-15 LAB — BASIC METABOLIC PANEL
BUN: 9 mg/dL (ref 4–21)
CREATININE: 0.5 mg/dL (ref 0.5–1.1)
GLUCOSE: 89 mg/dL
POTASSIUM: 4.2 mmol/L (ref 3.4–5.3)
Sodium: 133 mmol/L — AB (ref 137–147)

## 2012-12-15 LAB — HEPATIC FUNCTION PANEL
ALT: 31 U/L (ref 7–35)
AST: 45 U/L — AB (ref 13–35)
Alkaline Phosphatase: 54 U/L (ref 25–125)

## 2012-12-15 LAB — TSH: TSH: 5.75 u[IU]/mL (ref 0.41–5.90)

## 2012-12-15 LAB — LIPID PANEL
Cholesterol: 185 mg/dL (ref 0–200)
HDL: 76 mg/dL — AB (ref 35–70)
LDL CALC: 98 mg/dL
LDl/HDL Ratio: 2.4
Triglycerides: 56 mg/dL (ref 40–160)

## 2012-12-21 ENCOUNTER — Non-Acute Institutional Stay: Payer: Medicare Other | Admitting: Internal Medicine

## 2012-12-21 VITALS — BP 120/62 | HR 96 | Ht <= 58 in | Wt 132.0 lb

## 2012-12-21 DIAGNOSIS — S81801S Unspecified open wound, right lower leg, sequela: Secondary | ICD-10-CM

## 2012-12-21 DIAGNOSIS — IMO0002 Reserved for concepts with insufficient information to code with codable children: Secondary | ICD-10-CM

## 2012-12-21 DIAGNOSIS — S81001S Unspecified open wound, right knee, sequela: Secondary | ICD-10-CM

## 2012-12-21 DIAGNOSIS — R5381 Other malaise: Secondary | ICD-10-CM

## 2012-12-21 DIAGNOSIS — M169 Osteoarthritis of hip, unspecified: Secondary | ICD-10-CM

## 2012-12-21 DIAGNOSIS — M160 Bilateral primary osteoarthritis of hip: Secondary | ICD-10-CM

## 2012-12-21 DIAGNOSIS — I8390 Asymptomatic varicose veins of unspecified lower extremity: Secondary | ICD-10-CM

## 2012-12-21 DIAGNOSIS — I10 Essential (primary) hypertension: Secondary | ICD-10-CM | POA: Insufficient documentation

## 2012-12-21 DIAGNOSIS — E039 Hypothyroidism, unspecified: Secondary | ICD-10-CM

## 2012-12-21 DIAGNOSIS — R5383 Other fatigue: Secondary | ICD-10-CM

## 2012-12-21 NOTE — Progress Notes (Signed)
Patient ID: Tara Garrett, female   DOB: 1922-10-04, 79 y.o.   MRN: [id number] Lab reports 12/15/2012 CMP: Sodium 133, SGOT 45, and remainder is normal  Lipids: TC 185, trig 56, HDL 76, LDL 98  TSH is 5.757

## 2012-12-21 NOTE — Patient Instructions (Signed)
Continue current medication.

## 2012-12-21 NOTE — Progress Notes (Signed)
  Subjective:    Patient ID: Tara Garrett, female    DOB: Sep 10, 1922, 79 y.o.   MRN: [id number]  HPI  HYPERTENSION  Osteoarthritis, hip, bilateral: Hip discomfort continues. Uses handicap permit regularly. She is planning to see ortho, Dr. Luiz Blare.  Open wound of knee, leg (except thigh), and ankle, complicated, right, sequela: Wound right lower leg occurred 3 weeks ago. Slow healing. 2 x 3 inches. Shallow. Was painful last night. Never had infection.  Hypothyroidism: controlled  Other malaise and fatigue: improved  Varicose vein: stable. Hx of vein surgery  Current Outpatient Prescriptions on File Prior to Visit  Medication Sig Dispense Refill  . aspirin 325 MG EC tablet Take 325 mg by mouth daily. On Tuesdays      . Calcium Carb-Cholecalciferol (CALCIUM 500 +D) 500-400 MG-UNIT TABS Take by mouth daily.      . cholecalciferol (VITAMIN D) 1000 UNITS tablet Take 1,000 Units by mouth daily.      . fish oil-omega-3 fatty acids 1000 MG capsule Take 1 g by mouth daily.      . hydrochlorothiazide (HYDRODIURIL) 25 MG tablet Take 25 mg by mouth daily.      Marland Kitchen ibuprofen (ADVIL,MOTRIN) 200 MG tablet Take 200 mg by mouth 4 (four) times daily. Two tablets at breakfast and two tablets after dinner      . losartan (COZAAR) 50 MG tablet Take 50 mg by mouth daily.      . Multiple Vitamin (MULTIVITAMIN) tablet Take 1 tablet by mouth daily.      . Multiple Vitamins-Minerals (PROTEGRA PO) Take by mouth daily. Alternating with I Caps            Review of Systems DATA OBTAINED: from patient,   GENERAL: Feels well  No fevers, fatigue, change in activity status, appetite, or weight EENT: No mouth pain or sore throat MUSCULOSKELETAL: Hips feel stiff, difficult to bend over  No back pain   No muscle ache, pain, weakness   Gait is steady   NEUROLOGIC: No dizziness, fainting, headache No change in mental status   PSYCHIATRIC: No feelings of anxiety, depression  Sleeps well     Objective:   Physical  Exam GENERAL APPEARANCE: No acute distress, appropriately groomed, normal body habitus   Alert, pleasant, conversant. HEAD: Normocephalic, atraumatic EYES: Conjunctiva/lids clear   RESPIRATORY: Breathing is even, unlabored . Says she has a chronic dry cough. No fever or chest discomfort. MUSCULOSKELETAL.  Gait is steady. Right lower anterior leg is reddened. Wound is healing PSYCHIATRIC: Mood and affect appropriate to situation      Assessment & Plan:  Open wound of knee, leg (except thigh), and ankle, complicated, right, sequela: healing satisfactorily  HYPERTENSION: controlled  Osteoarthritis, hip, bilateral: see ortho  Hypothyroidism: controlled  Other malaise and fatigue: improved  Varicose vein: stable

## 2013-01-06 ENCOUNTER — Encounter: Payer: Self-pay | Admitting: Internal Medicine

## 2013-01-18 ENCOUNTER — Encounter: Payer: Self-pay | Admitting: Internal Medicine

## 2013-04-19 ENCOUNTER — Encounter: Payer: Self-pay | Admitting: Internal Medicine

## 2013-05-17 ENCOUNTER — Other Ambulatory Visit: Payer: Self-pay | Admitting: *Deleted

## 2013-05-17 MED ORDER — HYDROCHLOROTHIAZIDE 25 MG PO TABS: ORAL_TABLET | ORAL | Status: DC

## 2013-08-24 ENCOUNTER — Emergency Department (HOSPITAL_COMMUNITY): Payer: Medicare Other

## 2013-08-24 ENCOUNTER — Emergency Department (HOSPITAL_COMMUNITY)
Admission: EM | Admit: 2013-08-24 | Discharge: 2013-08-24 | Disposition: A | Discharge status: Home / Self Care | Payer: Medicare Other | Attending: Emergency Medicine | Admitting: Emergency Medicine

## 2013-08-24 ENCOUNTER — Encounter (HOSPITAL_COMMUNITY): Payer: Self-pay | Admitting: Emergency Medicine

## 2013-08-24 DIAGNOSIS — Z8742 Personal history of other diseases of the female genital tract: Secondary | ICD-10-CM | POA: Insufficient documentation

## 2013-08-24 DIAGNOSIS — W010XXA Fall on same level from slipping, tripping and stumbling without subsequent striking against object, initial encounter: Secondary | ICD-10-CM | POA: Insufficient documentation

## 2013-08-24 DIAGNOSIS — S2249XA Multiple fractures of ribs, unspecified side, initial encounter for closed fracture: Secondary | ICD-10-CM | POA: Insufficient documentation

## 2013-08-24 DIAGNOSIS — E559 Vitamin D deficiency, unspecified: Secondary | ICD-10-CM | POA: Insufficient documentation

## 2013-08-24 DIAGNOSIS — Z87891 Personal history of nicotine dependence: Secondary | ICD-10-CM | POA: Insufficient documentation

## 2013-08-24 DIAGNOSIS — Z8619 Personal history of other infectious and parasitic diseases: Secondary | ICD-10-CM | POA: Insufficient documentation

## 2013-08-24 DIAGNOSIS — E785 Hyperlipidemia, unspecified: Secondary | ICD-10-CM | POA: Insufficient documentation

## 2013-08-24 DIAGNOSIS — Y9389 Activity, other specified: Secondary | ICD-10-CM | POA: Insufficient documentation

## 2013-08-24 DIAGNOSIS — Z7982 Long term (current) use of aspirin: Secondary | ICD-10-CM | POA: Insufficient documentation

## 2013-08-24 DIAGNOSIS — Z86718 Personal history of other venous thrombosis and embolism: Secondary | ICD-10-CM | POA: Insufficient documentation

## 2013-08-24 DIAGNOSIS — S2242XA Multiple fractures of ribs, left side, initial encounter for closed fracture: Secondary | ICD-10-CM

## 2013-08-24 DIAGNOSIS — S2239XA Fracture of one rib, unspecified side, initial encounter for closed fracture: Secondary | ICD-10-CM

## 2013-08-24 DIAGNOSIS — Z8669 Personal history of other diseases of the nervous system and sense organs: Secondary | ICD-10-CM | POA: Insufficient documentation

## 2013-08-24 DIAGNOSIS — Z791 Long term (current) use of non-steroidal anti-inflammatories (NSAID): Secondary | ICD-10-CM | POA: Insufficient documentation

## 2013-08-24 DIAGNOSIS — I10 Essential (primary) hypertension: Secondary | ICD-10-CM | POA: Insufficient documentation

## 2013-08-24 DIAGNOSIS — Z79899 Other long term (current) drug therapy: Secondary | ICD-10-CM | POA: Insufficient documentation

## 2013-08-24 DIAGNOSIS — Z853 Personal history of malignant neoplasm of breast: Secondary | ICD-10-CM | POA: Insufficient documentation

## 2013-08-24 DIAGNOSIS — Y9210 Unspecified residential institution as the place of occurrence of the external cause: Secondary | ICD-10-CM | POA: Insufficient documentation

## 2013-08-24 DIAGNOSIS — Z8739 Personal history of other diseases of the musculoskeletal system and connective tissue: Secondary | ICD-10-CM | POA: Insufficient documentation

## 2013-08-24 HISTORY — DX: Multiple fractures of ribs, left side, initial encounter for closed fracture: S22.42XA

## 2013-08-24 MED ORDER — HYDROCODONE-ACETAMINOPHEN 5-325 MG PO TABS: 1.0000 | ORAL_TABLET | ORAL | Status: DC | PRN

## 2013-08-24 NOTE — Discharge Instructions (Signed)
Tara Garrett will need to be transferred to the rehab portion at [facility] Rib Fracture A rib fracture is a break or crack in one of the bones of the ribs. The ribs are a group of long, curved bones that wrap around your chest and attach to your spine. They protect your lungs and other organs in the chest cavity. A broken or cracked rib is often painful, but most do not cause other problems. Most rib fractures heal on their own over time. However, rib fractures can be more serious if multiple ribs are broken or if broken ribs move out of place and push against other structures. CAUSES   A direct blow to the chest. For example, this could happen during contact sports, a car accident, or a fall against a hard object.  Repetitive movements with high force, such as pitching a baseball or having severe coughing spells. SYMPTOMS   Pain when you breathe in or cough.  Pain when someone presses on the injured area. DIAGNOSIS  Your caregiver will perform a physical exam. Various imaging tests may be ordered to confirm the diagnosis and to look for related injuries. These tests may include a chest X-ray, computed tomography (CT), magnetic resonance imaging (MRI), or a bone scan. TREATMENT  Rib fractures usually heal on their own in 1 3 months. The longer healing period is often associated with a continued cough or other aggravating activities. During the healing period, pain control is very important. Medication is usually given to control pain. Hospitalization or surgery may be needed for more severe injuries, such as those in which multiple ribs are broken or the ribs have moved out of place.  HOME CARE INSTRUCTIONS   Avoid strenuous activity and any activities or movements that cause pain. Be careful during activities and avoid bumping the injured rib.  Gradually increase activity as directed by your caregiver.  Only take over-the-counter or prescription medications as directed by your caregiver. Do  not take other medications without asking your caregiver first.  Apply ice to the injured area for the first 1 2 days after you have been treated or as directed by your caregiver. Applying ice helps to reduce inflammation and pain.  Put ice in a plastic bag.  Place a towel between your skin and the bag.   Leave the ice on for 15 20 minutes at a time, every 2 hours while you are awake.  Perform deep breathing as directed by your caregiver. This will help prevent pneumonia, which is a common complication of a broken rib. Your caregiver may instruct you to:  Take deep breaths several times a day.  Try to cough several times a day, holding a pillow against the injured area.  Use a device called an incentive spirometer to practice deep breathing several times a day.  Drink enough fluids to keep your urine clear or pale yellow. This will help you avoid constipation.   Do not wear a rib belt or binder. These restrict breathing, which can lead to pneumonia.  SEEK IMMEDIATE MEDICAL CARE IF:   You have a fever.   You have difficulty breathing or shortness of breath.   You develop a continual cough, or you cough up thick or bloody sputum.  You feel sick to your stomach (nausea), throw up (vomit), or have abdominal pain.   You have worsening pain not controlled with medications.  MAKE SURE YOU:  Understand these instructions.  Will watch your condition.  Will get help right away  if you are not doing well or get worse. Document Released: 05/27/2005 Document Revised: 01/27/2013 Document Reviewed: 07/29/2012 [facility] Patient Information 2014 [employer], [place]. Incentive Spirometer An incentive spirometer is a tool that can help keep your lungs clear and active. This tool measures how well you are filling your lungs with each breath. Taking long deep breaths may help reverse or decrease the chance of developing breathing (pulmonary) problems (especially infection) following:  Surgery  of the chest or abdomen.  Surgery if you have a history of smoking or a lung problem.  A long period of time when you are unable to move or be active. BEFORE THE PROCEDURE   If the spirometer includes an indictor to show your best effort, your nurse or respiratory therapist will set it to a desired goal.  If possible, sit up straight or lean slightly forward. Try not to slouch.  Hold the incentive spirometer in an upright position. INSTRUCTIONS FOR USE  1. Sit on the edge of your bed if possible, or sit up as far as you can in bed or on a chair. 2. Hold the incentive spirometer in an upright position. 3. Breathe out normally. 4. Place the mouthpiece in your mouth and seal your lips tightly around it. 5. Breathe in slowly and as deeply as possible, raising the piston or the ball toward the top of the column. 6. Hold your breath for 3-5 seconds or for as long as possible. Allow the piston or ball to fall to the bottom of the column. 7. Remove the mouthpiece from your mouth and breathe out normally. 8. Rest for a few seconds and repeat Steps 1 through 7 at least 10 times every 1-2 hours when you are awake. Take your time and take a few normal breaths between deep breaths. 9. The spirometer may include an indicator to show your best effort. Use the indicator as a goal to work toward during each repetition. 10. After each set of 10 deep breaths, practice coughing to be sure your lungs are clear. If you have an incision (the cut made at the time of surgery), support your incision when coughing by placing a pillow or rolled up towels firmly against it. Once you are able to get out of bed, walk around indoors and cough well. You may stop using the incentive spirometer when instructed by your caregiver.  RISKS AND COMPLICATIONS  Breathing too quickly may cause dizziness. At an extreme, this could cause you to pass out. Take your time so you do not get dizzy or light-headed.  If you are in pain,  you may need to take or ask for pain medication before doing incentive spirometry. It is harder to take a deep breath if you are having pain. AFTER USE  Rest and breathe slowly and easily.  It can be helpful to keep track of a log of your progress. Your caregiver can provide you with a simple table to help with this. If you are using the spirometer at home, follow these instructions: SEEK MEDICAL CARE IF:   You are having difficultly using the spirometer.  You have trouble using the spirometer as often as instructed.  Your pain medication is not giving enough relief while using the spirometer.  You develop fever of 100.5 F (38.1 C) or higher. SEEK IMMEDIATE MEDICAL CARE IF:   You cough up bloody sputum that had not been present before.  You develop fever of 102 F (38.9 C) or greater.  You develop worsening pain  at or near the incision site. MAKE SURE YOU:   Understand these instructions.  Will watch your condition.  Will get help right away if you are not doing well or get worse. Document Released: 10/07/2006 Document Revised: 08/19/2011 Document Reviewed: 12/08/2006 [facility] Patient Information 2014 [place], [place].

## 2013-08-24 NOTE — ED Notes (Signed)
[facility] called  

## 2013-08-24 NOTE — ED Notes (Addendum)
Per EMS, pt from [facility]. Pt fell last night at 2115. Pt states she had 2 glasses of wine and tripped in her room. Pt complains of 5/10 left rib pain and has bruise on left arm. Pt was found on the floor by retirement home staff this morning. Pt denies LOC or being on blood thinners.

## 2013-08-24 NOTE — ED Notes (Signed)
Called [facility]. Left message.

## 2013-08-24 NOTE — ED Provider Notes (Signed)
CSN: [id number]     Arrival date & time 08/24/13  1127 History   First MD Initiated Contact with Patient 08/24/13 1142     Chief Complaint  Patient presents with  . Fall     (Consider location/radiation/quality/duration/timing/severity/associated sxs/prior Treatment) Patient is a 80 y.o. female presenting with fall. The history is provided by the patient and a friend.  Fall   patient here complaining of left-sided rib pain after falling last night when she was walking. Denies any loss of consciousness. No head or neck pain currently. Denies abdominal pain. No hip pain. Pain characterized as sharp and worse with movement. Denies any shortness of breath or chest pain. Pain localized to her left posterior eighth and ninth rib. No treatment used prior to arrival. She does not take any blood thinners.  Past Medical History  Diagnosis Date  . Varicose veins   . Hypothyroidism   . Vitamin D deficiency   . Hyperlipidemia   . Hypertension   . Shoulder pain, right 10/2009    X- rayed at Dr. Berenice Primas.Severe Degenerative Disease. Recommended an artificial shoulder. Saw Dr. Tamera Punt .  Marland Kitchen Right shoulder injury 12/2009  . Leg pain   . Hyperglycemia   . Macular degeneration   . Keratosis seborrheica   . Left knee pain   . Osteoporosis   . Neoplasm of breast     right breast cancer  . Fall 10-09-2011    fell outdoors on driveway fell and hit right side of body and face  . Pain in joint, pelvic region and thigh 05/11/2012  . Pain in joint, site unspecified 02/03/39  . Phlebitis and thrombophlebitis of other deep vessels of lower extremities 01/06/2012  . Other malaise and fatigue 01/06/2012  . Closed fracture of two ribs 10/21/2011  . Other specified circulatory system disorders 10/09/2011  . Open wound of knee, leg (except thigh), and ankle, without mention of complication [id number]  . Closed fracture of unspecified part of upper end of humerus 01/13/2005  . Female stress incontinence 02/10/2004  .  Herpes zoster without mention of complication [id number]  . Malignant neoplasm of breast (female), unspecified site   . Varicose vein 2014   Past Surgical History  Procedure Laterality Date  . Left cataract  1999  . Right mastectomy  1989  . Vesicovaginal fistula closure w/ tah  1967  . Colon polyps  1989, 1992, 1995, 1997  . Cataract extraction  04/2004    Dr. Bing Plume  . Mole removal  03/04/2009    on back by Dr. Derrel Nip  . Endovenous ablation saphenous vein w/ laser  01-30-2012    left greater saphenous vein and sclerotherapy left leg  . Abdominal hysterectomy  1967   Family History  Problem Relation Age of Onset  . Cancer Mother    History  Substance Use Topics  . Smoking status: Former Smoker    Types: Cigarettes    Quit date: 06/10/1978  . Smokeless tobacco: Never Used  . Alcohol Use: 0.0 oz/week    0-1 drink(s) per week   OB History   Grav Para Term Preterm Abortions TAB SAB Ect Mult Living                 Review of Systems  All other systems reviewed and are negative.      Allergies  Mobic and Tramadol  Home Medications   Current Outpatient Rx  Name  Route  Sig  Dispense  Refill  . aspirin 325 MG EC  tablet   Oral   Take 325 mg by mouth daily. On Tuesdays         . Calcium Carb-Cholecalciferol (CALCIUM 500 +D) 500-400 MG-UNIT TABS   Oral   Take by mouth daily.         . cholecalciferol (VITAMIN D) 1000 UNITS tablet   Oral   Take 1,000 Units by mouth daily.         . fish oil-omega-3 fatty acids 1000 MG capsule   Oral   Take 1 g by mouth daily.         . hydrochlorothiazide (HYDRODIURIL) 25 MG tablet      Take one tablet by mouth once daily to control blood pressure   90 tablet   0   . ibuprofen (ADVIL,MOTRIN) 200 MG tablet   Oral   Take 200 mg by mouth 4 (four) times daily. Two tablets at breakfast and two tablets after dinner         . losartan (COZAAR) 50 MG tablet   Oral   Take 50 mg by mouth daily.         .  Multiple Vitamin (MULTIVITAMIN) tablet   Oral   Take 1 tablet by mouth daily.         . Multiple Vitamins-Minerals (ICAPS) CAPS   Oral   Take by mouth. Take one daily alt with Protegra         . Multiple Vitamins-Minerals (PROTEGRA PO)   Oral   Take by mouth daily. Alternating with I Caps          BP 174/74  Pulse 91  Temp(Src) 97.7 F (36.5 C) (Oral)  Resp 20  SpO2 95% Physical Exam  Nursing note and vitals reviewed. Constitutional: She is oriented to person, place, and time. She appears well-developed and well-nourished.  Non-toxic appearance. No distress.  HENT:  Head: Normocephalic and atraumatic.  Eyes: Conjunctivae, EOM and lids are normal. Pupils are equal, round, and reactive to light.  Neck: Normal range of motion. Neck supple. No tracheal deviation present. No mass present.  Cardiovascular: Normal rate, regular rhythm and normal heart sounds.  Exam reveals no gallop.   No murmur heard. Pulmonary/Chest: Effort normal and breath sounds normal. No stridor. No respiratory distress. She has no decreased breath sounds. She has no wheezes. She has no rhonchi. She has no rales.    Abdominal: Soft. Normal appearance and bowel sounds are normal. She exhibits no distension. There is no tenderness. There is no rigidity, no rebound, no guarding and no CVA tenderness.  Musculoskeletal: Normal range of motion. She exhibits no edema and no tenderness.  Full range of motion at bilateral hips. No shortening or deformities noted. No pain at her knees or ankles bilaterally.  Neurological: She is alert and oriented to person, place, and time. She has normal strength. No cranial nerve deficit or sensory deficit. GCS eye subscore is 4. GCS verbal subscore is 5. GCS motor subscore is 6.  Skin: Skin is warm and dry. No abrasion and no rash noted.  Psychiatric: She has a normal mood and affect. Her speech is normal and behavior is normal.    ED Course  Procedures (including critical  care time) Labs Review Labs Reviewed - No data to display Imaging Review Dg Ribs Unilateral W/chest Left  08/24/2013   CLINICAL DATA:  Recent fall with left lower posterior rib pain, history of right breast carcinoma with mastectomy  EXAM: LEFT RIBS AND CHEST - 3+ VIEW  COMPARISON:  Chest x-ray of 10/10/2011  FINDINGS: No active infiltrate or effusion is seen. Cardiomegaly is stable. Marked abnormality of the right shoulder is noted with abnormality of the right humeral head and glenoid fossa.  Left rib detail films show acute fractures of the anterior left seventh and eighth ribs.  IMPRESSION: 1. Acute fractures of the left anterior seventh and eighth ribs. 2. Cardiomegaly.  No active lung disease.   Electronically Signed   By: Ivar Drape M.D.   On: 08/24/2013 13:03     EKG Interpretation None      MDM   Final diagnoses:  None    Patient given spirometer to go home with instructions.    Leota Jacobsen, MD 08/24/13 1329

## 2013-08-25 ENCOUNTER — Non-Acute Institutional Stay (SKILLED_NURSING_FACILITY): Payer: Medicare Other | Admitting: Geriatric Medicine

## 2013-08-25 ENCOUNTER — Encounter: Payer: Self-pay | Admitting: Geriatric Medicine

## 2013-08-25 DIAGNOSIS — R269 Unspecified abnormalities of gait and mobility: Secondary | ICD-10-CM | POA: Insufficient documentation

## 2013-08-25 DIAGNOSIS — S2242XA Multiple fractures of ribs, left side, initial encounter for closed fracture: Secondary | ICD-10-CM

## 2013-08-25 DIAGNOSIS — S2249XA Multiple fractures of ribs, unspecified side, initial encounter for closed fracture: Secondary | ICD-10-CM

## 2013-08-25 DIAGNOSIS — I10 Essential (primary) hypertension: Secondary | ICD-10-CM

## 2013-08-25 HISTORY — DX: Unspecified abnormalities of gait and mobility: R26.9

## 2013-08-25 NOTE — Assessment & Plan Note (Signed)
Left anterior rib fractures re: fall 08/23/2013. Pain management satisfactory, is beginning to move around, is using IS

## 2013-08-25 NOTE — Assessment & Plan Note (Addendum)
Patient with poor balance for some time, now s/p fall w/rib fractures. May be related to spinal stenosis, MRI 04/2013 not available for review.  Her current supplemental insurance does not cover [facility] therapy. Patient is willing to pay for 1 visit to establish safe ambulation with assistive device

## 2013-08-25 NOTE — Assessment & Plan Note (Signed)
BP stable, continue current medication/ monitoring

## 2013-08-25 NOTE — Progress Notes (Signed)
Patient ID: Tara Garrett, female   DOB: 01/18/1923, 80 y.o.   MRN: [id number]   [facility] [phone])  Code Status: DNR  Contact Information   Name Relation Home Work [place] [phone]  [phone]      Chief Complaint  Patient presents with  . Hospitalization Follow-up    Rib frcatures    HPI: This is a 80 y.o. female resident of [place],  Independent Living  section.  Patient reports she fell in her bedroom around 9pm Monday evening, was unable to get up or reach th emergency pull cord. The next morning Security personnel /nurse at [employer] entered her apartment when she didn't check in. Patient was found on the floor, awake/ alert, c/o pain to her left side, unable to get up. EMS transported her to [facility]. ED evaluation included x-ray of left ribs/chest, rib fractures identified. She was prescribed pain medication and d/c to the Skilled Rehab section at [facility] late yesterday afternoon.  Today, nurse reports patient is able to ambulate to BR with assistance, tolerating  Scheduled pain medication w/o adverse effect, no prn dosing required. Patient reports she was having difficulty with ambulation prior to fall; felt balance was poor. PT has been delayed due to insurance issue.    Allergies  Allergen Reactions  . Mobic [Meloxicam]     Blood pressure goes up  . Tramadol     Blood pressure goes up       Medication List       This list is accurate as of: 08/25/13  2:30 PM.  Always use your most recent med list.               aspirin 325 MG EC tablet  - Take 325 mg by mouth once a week. On Tuesdays  -      CALCIUM 500 +D 500-400 MG-UNIT Tabs  Generic drug:  Calcium Carb-Cholecalciferol  Take 1 tablet by mouth daily.     cholecalciferol 1000 UNITS tablet  Commonly known as:  VITAMIN D  Take 1,000 Units by mouth daily.     fish oil-omega-3 fatty acids 1000 MG capsule  Take 1 g by mouth daily.     GLUCOSAMINE CHONDR COMPLEX PO  Take 1 tablet by mouth daily.     hydrochlorothiazide 25 MG tablet  Commonly known as:  HYDRODIURIL  Take 25 mg by mouth daily.     HYDROcodone-acetaminophen 5-325 MG per tablet  Commonly known as:  NORCO/VICODIN  Take 1 tablet by mouth as directed. Take 1 tablet twice daily. MAy have additional 1 every 4 hours as needed to reduce pain     ibuprofen 200 MG tablet  Commonly known as:  ADVIL,MOTRIN  Take 400 mg by mouth 2 (two) times daily as needed (Pain).     losartan 50 MG tablet  Commonly known as:  COZAAR  Take 50 mg by mouth daily.     multivitamin tablet  Take 1 tablet by mouth daily.     PROTEGRA PO  Take 1 tablet by mouth every other day. Alternating with I Caps     ICAPS Caps  Take 1 capsule by mouth every other day. Take one daily alt with Protegra         DATA REVIEWED  Radiologic Exams 08/24/2013 LEFT RIBS AND CHEST - 3+ VIEW  COMPARISON:  Chest x-ray of 10/10/2011   FINDINGS: No active infiltrate or effusion is seen. Cardiomegaly is stable. Marked  abnormality of the right shoulder is noted with abnormality of the right humeral head and glenoid fossa.   Left rib detail films show acute fractures of the anterior left seventh and eighth ribs.     Cardiovascular Exams:   Laboratory Studies: No results found for this basename: WBC, HGB, HCT, MCV, PLT   Lab Results  Component Value Date   NA 133* 12/15/2012   K 4.2 12/15/2012   GLU 89 12/15/2012   BUN 9 12/15/2012   CREATININE 0.5 12/15/2012        Past Medical History  Diagnosis Date  . Varicose veins   . Hypothyroidism   . Vitamin D deficiency   . Hyperlipidemia   . Hypertension   . Shoulder pain, right 10/2009    X- rayed at Dr. Berenice Primas.Severe Degenerative Disease. Recommended an artificial shoulder. Saw Dr. Tamera Punt .  Marland Kitchen Right shoulder injury 12/2009  . Leg pain   . Hyperglycemia   . Macular degeneration   . Keratosis seborrheica   . Left knee pain   . Osteoporosis    . Neoplasm of breast     right breast cancer  . Fall 10-09-2011    fell outdoors on driveway fell and hit right side of body and face  . Pain in joint, pelvic region and thigh 05/11/2012  . Pain in joint, site unspecified 02/03/39  . Phlebitis and thrombophlebitis of other deep vessels of lower extremities 01/06/2012  . Other malaise and fatigue 01/06/2012  . Closed fracture of two ribs 10/21/2011  . Other specified circulatory system disorders 10/09/2011  . Open wound of knee, leg (except thigh), and ankle, without mention of complication 0/81/4481  . Closed fracture of unspecified part of upper end of humerus 01/13/2005  . Female stress incontinence 02/10/2004  . Herpes zoster without mention of complication 85/63/1497  . Malignant neoplasm of breast (female), unspecified site   . Varicose vein 2014   Past Surgical History  Procedure Laterality Date  . Left cataract  1999  . Right mastectomy  1989  . Vesicovaginal fistula closure w/ tah  1967  . Colon polyps  1989, 1992, 1995, 1997  . Cataract extraction  04/2004    Dr. Bing Plume  . Mole removal  03/04/2009    on back by Dr. Derrel Nip  . Endovenous ablation saphenous vein w/ laser  01-30-2012    left greater saphenous vein and sclerotherapy left leg  . Abdominal hysterectomy  1967   Family Status  Relation Status Death Age  . Mother Deceased   . Father Deceased    History   Social History Narrative  . No narrative on file     REVIEW OF SYSTEMS  DATA OBTAINED: from patient, nurse, medical record GENERAL: Feels well   No recent fever, fatigue, change in appetite or weight SKIN: No itch, rash or open wounds EYES: No eye pain, dryness or itching  No change in vision EARS: No earache, tinnitus, change in hearing NOSE: No congestion, drainage or bleeding MOUTH/THROAT: No mouth or tooth pain  No sore throat   No difficulty chewing or swallowing RESPIRATORY: No cough, wheezing, SOB CARDIAC: No chest pain, palpitations  No  edema. GI: No abdominal pain  No nausea, vomiting,constipation  No heartburn or reflux  Recent loose stools GU: No dysuria, frequency or urgency  No change in urine volume or character. Incontinence, wears pad  MUSCULOSKELETAL: Has been experiencing left hip pain, had MRI "minor stenosis". No knee pain, swelling or stiffness  No back  pain   Gait is unsteady   NEUROLOGIC: No dizziness, fainting, headache. No numbness or tingling in legs. Has numbness/tingling in hands  No change in mental status.  PSYCHIATRIC: No feelings of anxiety, depression  Sleeps well.  No behavior issue.    PHYSICAL EXAM Filed Vitals:   08/25/13 1119  BP: 161/83  Pulse: 84  Temp: 96.7 F (35.9 C)  Resp: 18  Weight: 124 lb 6.4 oz (56.427 kg)   Body mass index is 26.01 kg/(m^2).  GENERAL APPEARANCE: No acute distress, appropriately groomed, normal body habitus. Alert, pleasant, conversant. SKIN: No diaphoresis, rash, unusual lesions, wounds HEAD: Normocephalic, atraumatic EYES: Conjunctiva/lids clear. Pupils round, reactive.   EARS: External exam WNL. Poor hearing,chronic. NOSE: No deformity or discharge. MOUTH/THROAT: Lips w/o lesions. Oral mucosa, tongue moist, w/o lesion. Oropharynx w/o redness or lesions.  NECK: Supple, full ROM. No thyroid tenderness, enlargement or nodule LYMPHATICS: No head, neck or supraclavicular adenopathy RESPIRATORY: Breathing is even, unlabored. Lung sounds are clear and full. Mild left rib discomfort with deep breath. CARDIOVASCULAR: Heart RRR. No murmur or extra heart sounds  ARTERIAL: No carotidruit.   EDEMA: No peripheral edema. GASTROINTESTINAL: Abdomen is soft, non-tender, not distended w/ normal bowel sounds.  MUSCULOSKELETAL: Moves all extremities with full ROM, strength and tone. Back is without kyphosis, scoliosis or spinal process tenderness. Gait is unsteady NEUROLOGIC: Oriented to time, place, person. Cranial nerves 2-12 grossly intact, speech clear, no tremor.  Patella, brachial DTR 2+. PSYCHIATRIC: Mood and affect appropriate to situation   ASSESSMENT/PLAN  Closed fracture of two ribs Left anterior rib fractures re: fall 08/23/2013. Pain management satisfactory, is beginning to move around, is using IS  Gait disorder Patient with poor balance for some time, now s/p fall w/rib fractures. May be related to spinal stenosis, MRI 04/2013 not available for review.  Her current supplemental insurance does not cover [place] therapy. Patient is willing to pay for 1 visit to establish safe ambulation with assistive device  Hypertension BP stable, continue current medication/ monitoring    Family/ staff Communication:      Goals of care:   [place]   Labs/tests ordered:    Follow up: As needed  Mardene Celeste, NP-C [facility] [phone]  08/25/2013

## 2013-08-30 ENCOUNTER — Non-Acute Institutional Stay (SKILLED_NURSING_FACILITY): Payer: Medicare Other | Admitting: Internal Medicine

## 2013-08-30 DIAGNOSIS — S2249XA Multiple fractures of ribs, unspecified side, initial encounter for closed fracture: Secondary | ICD-10-CM

## 2013-08-30 DIAGNOSIS — I10 Essential (primary) hypertension: Secondary | ICD-10-CM

## 2013-08-30 DIAGNOSIS — R269 Unspecified abnormalities of gait and mobility: Secondary | ICD-10-CM

## 2013-08-31 ENCOUNTER — Encounter: Payer: Self-pay | Admitting: Geriatric Medicine

## 2013-08-31 ENCOUNTER — Non-Acute Institutional Stay (SKILLED_NURSING_FACILITY): Payer: Medicare Other | Admitting: Geriatric Medicine

## 2013-08-31 DIAGNOSIS — S2249XA Multiple fractures of ribs, unspecified side, initial encounter for closed fracture: Secondary | ICD-10-CM

## 2013-08-31 DIAGNOSIS — I10 Essential (primary) hypertension: Secondary | ICD-10-CM

## 2013-08-31 DIAGNOSIS — R269 Unspecified abnormalities of gait and mobility: Secondary | ICD-10-CM

## 2013-08-31 NOTE — Assessment & Plan Note (Signed)
Recent BP range 135-181/82-90, patient insists BP is OK when measured "by hand, not the machine". Will ask nurse to measure BP with manual cuff next 2 days.

## 2013-08-31 NOTE — Assessment & Plan Note (Signed)
Patient is ambulating with walker, advanced to independent ambulation today by PT. Berg score remains in high fall risk range w/o walker. She will con ntinue working with PT, goal is return to [place] home without walker. Intends to stay in Rehab section "until I can take care of myself"

## 2013-08-31 NOTE — Assessment & Plan Note (Signed)
Less pain with breathing and general movement. Continue hydrocodone BID for now.

## 2013-08-31 NOTE — Progress Notes (Signed)
Patient ID: FATIMA FEDIE, female   DOB: [birth year], 80 y.o.   MRN: [id number]   [facility] SN[facility] [phone])  Code Status: DNR      Contact Information   Name Relation Home Work [place] [phone]  [phone]      Chief Complaint  Patient presents with  . rib fractures    HPI: This is a 80 y.o. female resident of [place] section currently residing in [place] section due to rib fractures.   Last visit: Closed fracture of two ribs Left anterior rib fractures re: fall 08/23/2013. Pain management satisfactory, is beginning to move around, is using IS  Gait disorder Patient with poor balance for some time, now s/p fall w/rib fractures. May be related to spinal stenosis, MRI 04/2013 not available for review.  Her current supplemental insurance does not cover [place] therapy. Patient is willing to pay for 1 visit to establish safe ambulation with assistive device  Hypertension BP stable, continue current medication/ monitoring   Since last visit patient has made progress with ambulation. PT has fitted her with a walker, most recent Berg score is 45, just released to walk independently today. She has agreed to continue paying for PT sessions to improve balance. Pain has been well managed with BID dosing of hydrocodone.  Review of facility record shows elevated BP readings last several days. No report of headache, CP, dizziness, SOB. Patient tells me her BP readings are normal if a manual cuff is used.   Allergies  Allergen Reactions  . Mobic [Meloxicam]     Blood pressure goes up  . Tramadol     Blood pressure goes up     MEDICATION- Reviewed  DATA REVIEWED  Radiologic Exams 08/24/2013 LEFT RIBS AND CHEST - 3+ VIEW  COMPARISON:  Chest x-ray of 10/10/2011   FINDINGS: No active infiltrate or effusion is seen. Cardiomegaly is stable. Marked abnormality of the right shoulder is noted with abnormality  of the right humeral head and glenoid fossa.   Left rib detail films show acute fractures of the anterior left seventh and eighth ribs.     Cardiovascular Exams:   Laboratory Studies: No results found for this basename: WBC,  HGB,  HCT,  MCV,  PLT   Lab Results  Component Value Date   NA 133* 12/15/2012   K 4.2 12/15/2012   GLU 89 12/15/2012   BUN 9 12/15/2012   CREATININE 0.5 12/15/2012     REVIEW OF SYSTEMS  DATA OBTAINED: from patient, nurse, medical record GENERAL: Feels well   No recent fever, fatigue, change in appetite or weight SKIN: No itch, rash or open wounds EYES: No eye pain, dryness or itching  No change in vision EARS: No earache, tinnitus, change in hearing NOSE: No congestion, drainage or bleeding MOUTH/THROAT: No mouth or tooth pain  No sore throat   No difficulty chewing or swallowing RESPIRATORY: No cough, wheezing, SOB CARDIAC: No chest pain, palpitations  No edema. GI: No abdominal pain  No nausea, vomiting,constipation  No heartburn or reflux   GU: No dysuria, frequency or urgency  No change in urine volume or character. Incontinence, wears pad  MUSCULOSKELETAL: Minimal left rib discomfort, notices most often when turning over in bed. Has been experiencing left hip pain, had MRI "minor stenosis". No knee pain, swelling or stiffness  No back pain   Gait is unsteady   NEUROLOGIC: No dizziness, fainting, headache. No numbness or tingling in  legs. Has numbness/tingling in hands  No change in mental status.  PSYCHIATRIC: No feelings of anxiety, depression  Sleeps well.  No behavior issue.    PHYSICAL EXAM Filed Vitals:   08/31/13 1353  BP: 181/90  Pulse: 89   There is no weight on file to calculate BMI.  GENERAL APPEARANCE: No acute distress, appropriately groomed, normal body habitus. Alert, pleasant, conversant. SKIN: No diaphoresis, rash, unusual lesions, wounds HEAD: Normocephalic, atraumatic EYES: Conjunctiva/lids clear. Pupils round, reactive.   EARS:  External exam WNL. Poor hearing,chronic. NOSE: No deformity or discharge. RESPIRATORY: Breathing is even, unlabored. Lung sounds are clear and full. No left rib discomfort with deep breath. CARDIOVASCULAR: Heart RRR. No murmur or extra heart sounds GASTROINTESTINAL: Abdomen is soft, non-tender, not distended w/ normal bowel sounds.  MUSCULOSKELETAL: Moves all extremities with full ROM, strength and tone.  NEUROLOGIC: Oriented to time, place, person. Speech clear, no tremor.  PSYCHIATRIC: Mood and affect appropriate to situation   ASSESSMENT/PLAN  Hypertension Recent BP range 135-181/82-90, patient insists BP is OK when measured "by hand, not the machine". Will ask nurse to measure BP with manual cuff next 2 days.  Closed fracture of two ribs Less pain with breathing and general movement. Continue hydrocodone BID for now.  Gait disorder Patient is ambulating with walker, advanced to independent ambulation today by PT. Berg score remains in high fall risk range w/o walker. She will con ntinue working with PT, goal is return to [place] home without walker. Intends to stay in Rehab section "until I can take care of myself"    Family/ staff Communication:      Goals of care:   [place]   Labs/tests ordered:    Follow up: As needed  Mardene Celeste, NP-C [facility] [phone]  08/31/2013

## 2013-09-03 ENCOUNTER — Encounter: Payer: Self-pay | Admitting: Internal Medicine

## 2013-09-03 NOTE — Progress Notes (Signed)
Patient ID: Tara Garrett, female   DOB: 08-09-1922, 80 y.o.   MRN: [id number]    Location:  Funk: SNF (31)  PCP: Estill Dooms, MD  Code Status: LIVING WILL; [place]  Extended Emergency Contact Information Primary Emergency Contact: [name],B J Address: [address]          [place] [address] [place] Phone: [phone] Mobile Phone: [phone] Relation: Friend  Allergies  Allergen Reactions  . Mobic [Meloxicam]     Blood pressure goes up  . Tramadol     Blood pressure goes up     Chief Complaint  Patient presents with  . Chest Pain    New admit to SNF/ rehab follwing fracture of ribs    HPI:  Patient was seen in the emergency room 08/24/13. She was found have fractures of the left sixth and seventh ribs. Because of discomfort and a unsteady gait, she is admitted to the rehabilitation unit at [facility] on 08/24/13.  Since admission she has done very well. Pain is under much better control. She denies dyspnea or cough. She was able to walk about 50 yards today with the assessment of physical therapy. She does not feel quite ready to return to her independent apartment yet. She feels that she will be ready soon.  Hypertension is under good control.  Other problems include hypothyroidism, osteoporosis, and varicose veins. None of these should slow down her return to her apartment.    Past Medical History  Diagnosis Date  . Varicose veins   . Hypothyroidism   . Vitamin D deficiency   . Hyperlipidemia   . Hypertension   . Shoulder pain, right 10/2009    X- rayed at Dr. Berenice Primas.Severe Degenerative Disease. Recommended an artificial shoulder. Saw Dr. Tamera Punt .  Marland Kitchen Right shoulder injury 12/2009  . Leg pain   . Hyperglycemia   . Macular degeneration   . Keratosis seborrheica   . Left knee pain   . Osteoporosis   . Neoplasm of breast     right breast cancer  . Fall 10-09-2011    fell outdoors  on driveway fell and hit right side of body and face  . Pain in joint, pelvic region and thigh 05/11/2012  . Pain in joint, site unspecified 02/03/39  . Phlebitis and thrombophlebitis of other deep vessels of lower extremities 01/06/2012  . Other malaise and fatigue 01/06/2012  . Closed fracture of two ribs 10/21/2011, 08/24/2013  . Other specified circulatory system disorders 10/09/2011  . Open wound of knee, leg (except thigh), and ankle, without mention of complication 10/04/621  . Closed fracture of unspecified part of upper end of humerus 01/13/2005  . Female stress incontinence 02/10/2004  . Herpes zoster without mention of complication 76/28/3151  . Malignant neoplasm of breast (female), unspecified site   . Varicose vein 2014  . Multiple fractures of ribs of left side 08/24/2013  . Gait disorder 08/25/2013    Poor balance     Past Surgical History  Procedure Laterality Date  . Left cataract  1999  . Right mastectomy  1989  . Vesicovaginal fistula closure w/ tah  1967  . Colon polyps  1989, 1992, 1995, 1997  . Cataract extraction  04/2004    Dr. Bing Plume  . Mole removal  03/04/2009    on back by Dr. Derrel Nip  . Endovenous ablation saphenous vein w/ laser  01-30-2012    left greater saphenous vein and  sclerotherapy left leg  . Abdominal hysterectomy  1967    CONSULTANTS Ophth: [facility]: [facility]  PAST PROCEDURES 1998-Bone Density 2003-Bone Density 2006-Chest x-ray 04/2005-Bone Density: osteoporosis 11/07 CXR: normal 2007-Mammogram. 07-31-2006 X-Ray right foot-nondisplaced fracture proximal phalanx of 5th toe-Dr. Berenice Primas 10-06-2006 Colonoscopy-Diverticulosis, colon polyps, lymphocytic colitis-Dr. Sharlett Iles 09-11-2007 Mammogram negative 09-11-2007 Bone Density Osteoporotic 10/10/2008 [facility]  Social History: History   Social History  . Marital Status: Single    Spouse Name: N/A    Number of Children:  N/A  . Years of Education: N/A   Social History Main Topics  . Smoking status: Former Smoker    Types: Cigarettes    Quit date: 06/10/1978  . Smokeless tobacco: Never Used  . Alcohol Use: 0.0 oz/week    0-1 drink(s) per week  . Drug Use: No  . Sexual Activity: None   Other Topics Concern  . None   Social History Narrative   Patient is Single.Occupation   Lives in single level home, Independent Living section at [facility] since [place], drinks  Alcohol     Patient has Advanced planning documents:  DNR, HCPOA                Family History Family Status  Relation Status Death Age  . Mother Deceased   . Father Deceased    Family History  Problem Relation Age of Onset  . Cancer Mother      Medications: Patient's Medications  New Prescriptions   No medications on file  Previous Medications   ASPIRIN 325 MG EC TABLET    Take 325 mg by mouth once a week. On Tuesdays    CALCIUM CARB-CHOLECALCIFEROL (CALCIUM 500 +D) 500-400 MG-UNIT TABS    Take 1 tablet by mouth daily.    CHOLECALCIFEROL (VITAMIN D) 1000 UNITS TABLET    Take 1,000 Units by mouth daily.   FISH OIL-OMEGA-3 FATTY ACIDS 1000 MG CAPSULE    Take 1 g by mouth daily.   GLUCOSAMINE-CHONDROITIN (GLUCOSAMINE CHONDR COMPLEX PO)    Take 1 tablet by mouth daily.   HYDROCHLOROTHIAZIDE (HYDRODIURIL) 25 MG TABLET    Take 25 mg by mouth daily.   HYDROCODONE-ACETAMINOPHEN (NORCO/VICODIN) 5-325 MG PER TABLET    Take 1 tablet by mouth as directed. Take 1 tablet twice daily. MAy have additional 1 every 4 hours as needed to reduce pain   IBUPROFEN (ADVIL,MOTRIN) 200 MG TABLET    Take 400 mg by mouth 2 (two) times daily as needed (Pain).    LOSARTAN (COZAAR) 50 MG TABLET    Take 50 mg by mouth daily.   MULTIPLE VITAMIN (MULTIVITAMIN) TABLET    Take 1 tablet by mouth daily.   MULTIPLE VITAMINS-MINERALS (ICAPS) CAPS    Take 1 capsule by mouth every other day. Take one daily alt with Protegra     MULTIPLE VITAMINS-MINERALS (PROTEGRA PO)    Take 1 tablet by mouth every other day. Alternating with I Caps  Modified Medications   No medications on file  Discontinued Medications   No medications on file    Immunization History  Administered Date(s) Administered  . Influenza Whole 03/10/2012     Review of Systems  Constitutional: Negative for fever, fatigue and unexpected weight change.  HENT: Positive for hearing loss. Negative for congestion, ear pain, sore throat and trouble swallowing.   Eyes: Negative.   Respiratory: Negative for apnea, cough, choking, chest tightness, shortness of breath  and wheezing.   Cardiovascular: Positive for chest pain (left rib pain) and leg swelling (varicose veins). Negative for palpitations.  Gastrointestinal: Positive for constipation. Negative for nausea, abdominal pain and abdominal distention.  Endocrine: Negative.   Genitourinary: Negative.   Musculoskeletal: Positive for gait problem.       Recent fracture 2 ribs on the left side.  Skin: Negative.   Allergic/Immunologic: Negative.   Neurological: Positive for weakness. Negative for dizziness, tremors, facial asymmetry and speech difficulty.  Hematological: Negative.   Psychiatric/Behavioral: Negative.       Filed Vitals:   09/03/13 0952  BP: 120/62  Pulse: 70  Temp: 98.6 F (37 C)  Resp: 14  Weight: 124 lb (56.246 kg)   Physical Exam  Constitutional: She is oriented to person, place, and time. She appears well-developed and well-nourished. No distress.  HENT:  Right Ear: External ear normal.  Left Ear: External ear normal.  Nose: Nose normal.  Mouth/Throat: Oropharynx is clear and moist. No oropharyngeal exudate.  Hearing deficit  Eyes: Conjunctivae and EOM are normal. Pupils are equal, round, and reactive to light.  Neck: No JVD present. No tracheal deviation present. No thyromegaly present.  Cardiovascular: Normal rate, regular rhythm, normal heart sounds and intact  distal pulses.  Exam reveals no gallop and no friction rub.   No murmur heard. Respiratory: No respiratory distress. She has no wheezes. She has rales (bilateral). Tenderness: left ribs to palpation.  GI: She exhibits no distension and no mass. There is no tenderness.  Musculoskeletal: Normal range of motion. She exhibits edema. She exhibits no tenderness.  Unstable gait. Using walker. Crepitance and limited range of motion at the left shoulder.  Lymphadenopathy:    She has no cervical adenopathy.  Neurological: She is alert and oriented to person, place, and time. She has normal reflexes. No cranial nerve deficit. Coordination normal.  Skin: No rash noted. No erythema. No pallor.  Psychiatric: She has a normal mood and affect. Her behavior is normal. Judgment and thought content normal.       Labs reviewed: Nursing Home on 08/25/2013  Component Date Value Ref Range Status  . Glucose 12/15/2012 89   Final  . BUN 12/15/2012 9  4 - 21 mg/dL Final  . Creatinine 12/15/2012 0.5  0.5 - 1.1 mg/dL Final  . Potassium 12/15/2012 4.2  3.4 - 5.3 mmol/L Final  . Sodium 12/15/2012 133* 137 - 147 mmol/L Final  . LDl/HDL Ratio 12/15/2012 2.4   Final  . Triglycerides 12/15/2012 56  40 - 160 mg/dL Final  . Cholesterol 12/15/2012 185  0 - 200 mg/dL Final  . HDL 12/15/2012 76* 35 - 70 mg/dL Final  . LDL Cholesterol 12/15/2012 98   Final  . Alkaline Phosphatase 12/15/2012 54  25 - 125 U/L Final  . ALT 12/15/2012 31  7 - 35 U/L Final  . AST 12/15/2012 45* 13 - 35 U/L Final  . TSH 12/15/2012 5.75  0.41 - 5.90 uIU/mL Final     Assessment/Plan 1. Closed fracture of two ribs Pain is better. Breathing well.  2. Gait disorder Chronic. Needs to use walker regularly  3. Hypertension Controlled   Patient appears to be doing well at this time. She is recovering from her fall. Strength is improving. She should be able to return to her apartment within the next few days.

## 2013-10-18 ENCOUNTER — Non-Acute Institutional Stay: Payer: Medicare Other | Admitting: Internal Medicine

## 2013-10-18 ENCOUNTER — Encounter: Payer: Self-pay | Admitting: Internal Medicine

## 2013-10-18 VITALS — BP 130/72 | HR 84 | Wt 124.0 lb

## 2013-10-18 DIAGNOSIS — S2249XA Multiple fractures of ribs, unspecified side, initial encounter for closed fracture: Secondary | ICD-10-CM

## 2013-10-18 DIAGNOSIS — I10 Essential (primary) hypertension: Secondary | ICD-10-CM

## 2013-10-18 NOTE — Progress Notes (Signed)
Patient ID: Tara Garrett, female   DOB: 11-24-1922, 80 y.o.   MRN: [id number]    Location:  [facility]   Place of Service: OFFICE    Allergies  Allergen Reactions  . Mobic [Meloxicam]     Blood pressure goes up  . Tramadol     Blood pressure goes up     Chief Complaint  Patient presents with  . Rehab follow-up    left rib fracture from 08/23/13 fall    HPI:  Closed fracture of two ribs: pain free now  Hypertension: controlled. Had been elevated in Rehab    Medications: Patient's Medications  New Prescriptions   No medications on file  Previous Medications   ASPIRIN 325 MG EC TABLET    Take 325 mg by mouth once a week. On Tuesdays    CALCIUM CARB-CHOLECALCIFEROL (CALCIUM 500 +D) 500-400 MG-UNIT TABS    Take 1 tablet by mouth daily.    CHOLECALCIFEROL (VITAMIN D) 1000 UNITS TABLET    Take 1,000 Units by mouth daily.   FISH OIL-OMEGA-3 FATTY ACIDS 1000 MG CAPSULE    Take 1 g by mouth daily.   GLUCOSAMINE-CHONDROITIN (GLUCOSAMINE CHONDR COMPLEX PO)    Take 1 tablet by mouth daily.   HYDROCHLOROTHIAZIDE (HYDRODIURIL) 25 MG TABLET    Take 25 mg by mouth daily.   HYDROCODONE-ACETAMINOPHEN (NORCO/VICODIN) 5-325 MG PER TABLET    Take 1 tablet by mouth as directed. Take 1 tablet twice daily. MAy have additional 1 every 4 hours as needed to reduce pain   IBUPROFEN (ADVIL,MOTRIN) 200 MG TABLET    Take 400 mg by mouth 2 (two) times daily as needed (Pain).    LOSARTAN (COZAAR) 50 MG TABLET    Take 50 mg by mouth daily.   MULTIPLE VITAMIN (MULTIVITAMIN) TABLET    Take 1 tablet by mouth daily.   MULTIPLE VITAMINS-MINERALS (ICAPS) CAPS    Take 1 capsule by mouth every other day. Take one daily alt with Protegra   MULTIPLE VITAMINS-MINERALS (PROTEGRA PO)    Take 1 tablet by mouth every other day. Alternating with I Caps  Modified Medications   No medications on file  Discontinued Medications   No medications on file     Review of Systems  Constitutional: Negative for fever, fatigue  and unexpected weight change.  HENT: Positive for hearing loss. Negative for congestion, ear pain, sore throat and trouble swallowing.   Eyes: Negative.   Respiratory: Negative for apnea, cough, choking, chest tightness, shortness of breath and wheezing.   Cardiovascular: Positive for leg swelling (varicose veins). Negative for chest pain and palpitations.  Gastrointestinal: Positive for constipation. Negative for nausea, abdominal pain and abdominal distention.  Endocrine: Negative.   Genitourinary: Negative.   Musculoskeletal: Positive for gait problem.       Recent fracture 2 ribs on the left side.  Skin: Negative.   Allergic/Immunologic: Negative.   Neurological: Positive for weakness. Negative for dizziness, tremors, facial asymmetry and speech difficulty.  Hematological: Negative.   Psychiatric/Behavioral: Negative.     Filed Vitals:   10/18/13 1430  BP: 130/72  Pulse: 84  Weight: 124 lb (56.246 kg)   Physical Exam  Constitutional: She is oriented to person, place, and time. She appears well-developed and well-nourished. No distress.  HENT:  Hearing loss  Eyes: Conjunctivae and EOM are normal. Pupils are equal, round, and reactive to light.  Neck: No JVD present. No tracheal deviation present. No thyromegaly present.  Cardiovascular: Normal rate, regular rhythm, normal heart  sounds and intact distal pulses.  Exam reveals no gallop and no friction rub.   No murmur heard. Pulmonary/Chest: No respiratory distress. She has no wheezes. She has no rales. She exhibits no tenderness.  Abdominal: She exhibits no distension and no mass. There is no tenderness.  Musculoskeletal: Normal range of motion. She exhibits edema. She exhibits no tenderness.  Lymphadenopathy:    She has no cervical adenopathy.  Neurological: She is alert and oriented to person, place, and time. She has normal reflexes. No cranial nerve deficit. Coordination normal.  Skin: No rash noted. No erythema. No pallor.   Psychiatric: She has a normal mood and affect. Her behavior is normal. Judgment and thought content normal.     Labs reviewed: Nursing Home on 08/25/2013  Component Date Value Ref Range Status  . Glucose 12/15/2012 89   Final  . BUN 12/15/2012 9  4 - 21 mg/dL Final  . Creatinine 12/15/2012 0.5  0.5 - 1.1 mg/dL Final  . Potassium 12/15/2012 4.2  3.4 - 5.3 mmol/L Final  . Sodium 12/15/2012 133* 137 - 147 mmol/L Final  . LDl/HDL Ratio 12/15/2012 2.4   Final  . Triglycerides 12/15/2012 56  40 - 160 mg/dL Final  . Cholesterol 12/15/2012 185  0 - 200 mg/dL Final  . HDL 12/15/2012 76* 35 - 70 mg/dL Final  . LDL Cholesterol 12/15/2012 98   Final  . Alkaline Phosphatase 12/15/2012 54  25 - 125 U/L Final  . ALT 12/15/2012 31  7 - 35 U/L Final  . AST 12/15/2012 45* 13 - 35 U/L Final  . TSH 12/15/2012 5.75  0.41 - 5.90 uIU/mL Final      Assessment/Plan   1. Closed fracture of two ribs resolved  2. Hypertension controlled

## 2013-10-22 ENCOUNTER — Other Ambulatory Visit: Payer: Self-pay | Admitting: Geriatric Medicine

## 2013-10-22 MED ORDER — HYDROCHLOROTHIAZIDE 25 MG PO TABS: 25.0000 mg | ORAL_TABLET | Freq: Every day | ORAL | Status: DC

## 2013-10-22 MED ORDER — LOSARTAN POTASSIUM 50 MG PO TABS: 50.0000 mg | ORAL_TABLET | Freq: Every day | ORAL | Status: DC

## 2014-01-27 LAB — BASIC METABOLIC PANEL
BUN: 10 mg/dL (ref 4–21)
Creatinine: 0.4 mg/dL — AB (ref 0.5–1.1)
GLUCOSE: 96 mg/dL
Potassium: 4.3 mmol/L (ref 3.4–5.3)
Sodium: 123 mmol/L — AB (ref 137–147)

## 2014-01-27 LAB — HEMOGLOBIN A1C: Hgb A1c MFr Bld: 5.1 % (ref 4.0–6.0)

## 2014-01-27 LAB — HEPATIC FUNCTION PANEL
ALT: 17 U/L (ref 7–35)
AST: 25 U/L (ref 13–35)
Alkaline Phosphatase: 50 U/L (ref 25–125)
Bilirubin, Total: 0.7 mg/dL

## 2014-01-27 LAB — LIPID PANEL
CHOLESTEROL: 178 mg/dL (ref 0–200)
HDL: 93 mg/dL — AB (ref 35–70)
LDL CALC: 74 mg/dL
Triglycerides: 41 mg/dL (ref 40–160)

## 2014-01-27 LAB — TSH: TSH: 3.76 u[IU]/mL (ref 0.41–5.90)

## 2014-01-31 ENCOUNTER — Encounter: Payer: Medicare Other | Admitting: Internal Medicine

## 2014-01-31 ENCOUNTER — Encounter: Payer: Self-pay | Admitting: Internal Medicine

## 2014-01-31 VITALS — BP 122/76 | HR 74 | Wt 121.0 lb

## 2014-01-31 DIAGNOSIS — R5383 Other fatigue: Secondary | ICD-10-CM

## 2014-01-31 DIAGNOSIS — R269 Unspecified abnormalities of gait and mobility: Secondary | ICD-10-CM

## 2014-01-31 DIAGNOSIS — I10 Essential (primary) hypertension: Secondary | ICD-10-CM

## 2014-01-31 DIAGNOSIS — E039 Hypothyroidism, unspecified: Secondary | ICD-10-CM

## 2014-01-31 DIAGNOSIS — R5381 Other malaise: Secondary | ICD-10-CM

## 2014-01-31 DIAGNOSIS — I83893 Varicose veins of bilateral lower extremities with other complications: Secondary | ICD-10-CM

## 2014-02-24 ENCOUNTER — Encounter: Payer: Self-pay | Admitting: Internal Medicine

## 2014-04-11 ENCOUNTER — Encounter: Payer: Self-pay | Admitting: Internal Medicine

## 2014-04-11 ENCOUNTER — Non-Acute Institutional Stay: Payer: Medicare Other | Admitting: Internal Medicine

## 2014-04-11 VITALS — BP 118/70 | HR 90 | Temp 97.7°F | Resp 10 | Ht <= 58 in | Wt 121.0 lb

## 2014-04-11 DIAGNOSIS — E039 Hypothyroidism, unspecified: Secondary | ICD-10-CM

## 2014-04-11 DIAGNOSIS — M160 Bilateral primary osteoarthritis of hip: Secondary | ICD-10-CM

## 2014-04-11 DIAGNOSIS — R269 Unspecified abnormalities of gait and mobility: Secondary | ICD-10-CM

## 2014-04-11 DIAGNOSIS — I10 Essential (primary) hypertension: Secondary | ICD-10-CM

## 2014-04-11 NOTE — Progress Notes (Signed)
Patient ID: Tara Garrett, female   DOB: 06-Mar-1923, 80 y.o.   MRN: [id number]    [facility]    Place of Service: [facility] ([facility] OFFICE   Allergies  Allergen Reactions  . Mobic [Meloxicam]     Blood pressure goes up  . Tramadol     Blood pressure goes up     Chief Complaint  Patient presents with  . Medical Management of Chronic Issues    3 month follow-up on B/P, thyroid and fatigue   . Immunizations    Refused flu vaccine     HPI:  Gait disorder: some hip pains. Using 4 wheel walker  Essential hypertension: controlled  Hypothyroidism, unspecified hypothyroidism type: normal lab late August 2015.  Primary osteoarthritis of both hips: using Advil occassional    Medications: Patient's Medications  New Prescriptions   No medications on file  Previous Medications   ASPIRIN 325 MG EC TABLET    Take 325 mg by mouth once a week. On Tuesdays    CALCIUM CARB-CHOLECALCIFEROL (CALCIUM 500 +D) 500-400 MG-UNIT TABS    Take 1 tablet by mouth daily.    CHOLECALCIFEROL (VITAMIN D) 1000 UNITS TABLET    Take 1,000 Units by mouth daily.   FISH OIL-OMEGA-3 FATTY ACIDS 1000 MG CAPSULE    Take 1 g by mouth daily.   HYDROCHLOROTHIAZIDE (HYDRODIURIL) 25 MG TABLET    Take 1 tablet (25 mg total) by mouth daily.   IBUPROFEN (ADVIL,MOTRIN) 200 MG TABLET    Take 400 mg by mouth 2 (two) times daily as needed (Pain).    LOSARTAN (COZAAR) 50 MG TABLET    Take 1 tablet (50 mg total) by mouth daily.   MULTIPLE VITAMIN (MULTIVITAMIN) TABLET    Take 1 tablet by mouth daily.   MULTIPLE VITAMINS-MINERALS (ICAPS) CAPS    Take 1 capsule by mouth every other day. Take one daily alt with Protegra   MULTIPLE VITAMINS-MINERALS (PROTEGRA PO)    Take 1 tablet by mouth every other day. Alternating with I Caps  Modified Medications   No medications on file  Discontinued Medications   No medications on file     Review of Systems  Constitutional: Negative.  Negative for  fever, fatigue and unexpected weight change.  HENT: Positive for hearing loss. Negative for congestion, ear pain, sore throat and trouble swallowing.   Eyes: Negative.   Respiratory: Negative for apnea, cough, choking, chest tightness, shortness of breath and wheezing.   Cardiovascular: Positive for leg swelling (varicose veins). Negative for chest pain and palpitations.  Gastrointestinal: Positive for constipation. Negative for nausea, abdominal pain and abdominal distention.  Endocrine: Negative.   Genitourinary: Negative.   Musculoskeletal: Positive for arthralgias and gait problem.       Healed fracture 2 ribs on the left side.  Skin: Negative.   Allergic/Immunologic: Negative.   Neurological: Negative for dizziness, tremors, facial asymmetry and speech difficulty.  Hematological: Negative.   Psychiatric/Behavioral: Negative.     Filed Vitals:   04/11/14 1347  BP: 118/70  Pulse: 90  Temp: 97.7 F (36.5 C)  TempSrc: Oral  Resp: 10  Height: 4\' 10"  (1.473 m)  Weight: 121 lb (54.885 kg)  SpO2: 93%   Body mass index is 25.3 kg/(m^2).  Physical Exam  Constitutional: She is oriented to person, place, and time. She appears well-developed and well-nourished. No distress.  HENT:  Hearing loss  Eyes: Conjunctivae and EOM are normal. Pupils are equal, round, and reactive to light.  Neck: No JVD present. No tracheal deviation present. No thyromegaly present.  Cardiovascular: Normal rate, regular rhythm, normal heart sounds and intact distal pulses.  Exam reveals no gallop and no friction rub.   No murmur heard. Pulmonary/Chest: No respiratory distress. She has no wheezes. She has no rales. She exhibits no tenderness.  Abdominal: She exhibits no distension and no mass. There is no tenderness.  Musculoskeletal: Normal range of motion. She exhibits edema. She exhibits no tenderness.  Lymphadenopathy:    She has no cervical adenopathy.  Neurological: She is alert and oriented to  person, place, and time. She has normal reflexes. No cranial nerve deficit. Coordination normal.  Skin: No rash noted. No erythema. No pallor.  Psychiatric: She has a normal mood and affect. Her behavior is normal. Judgment and thought content normal.     Labs reviewed: Nursing Home on 01/31/2014  Component Date Value Ref Range Status  . Glucose 01/27/2014 96   Final  . BUN 01/27/2014 10  4 - 21 mg/dL Final  . Creatinine 01/27/2014 0.4* 0.5 - 1.1 mg/dL Final  . Potassium 01/27/2014 4.3  3.4 - 5.3 mmol/L Final  . Sodium 01/27/2014 123* 137 - 147 mmol/L Final  . Triglycerides 01/27/2014 41  40 - 160 mg/dL Final  . Cholesterol 01/27/2014 178  0 - 200 mg/dL Final  . HDL 01/27/2014 93* 35 - 70 mg/dL Final  . LDL Cholesterol 01/27/2014 74   Final  . Alkaline Phosphatase 01/27/2014 50  25 - 125 U/L Final  . ALT 01/27/2014 17  7 - 35 U/L Final  . AST 01/27/2014 25  13 - 35 U/L Final  . Bilirubin, Total 01/27/2014 0.7   Final  . Hgb A1c MFr Bld 01/27/2014 5.1  4.0 - 6.0 % Final  . TSH 01/27/2014 3.76  0.41 - 5.90 uIU/mL Final     Assessment/Plan  1. Gait disorder Continue use of walker  2. Essential hypertension -CMP  3. Hypothyroidism, unspecified hypothyroidism type -TSH  4. Primary osteoarthritis of both hips continue Advil prn

## 2014-06-21 ENCOUNTER — Encounter: Payer: Self-pay | Admitting: Internal Medicine

## 2014-06-21 ENCOUNTER — Non-Acute Institutional Stay: Payer: Medicare Other | Admitting: Internal Medicine

## 2014-06-21 VITALS — BP 112/72 | HR 53 | Temp 96.9°F | Wt 117.0 lb

## 2014-06-21 DIAGNOSIS — I872 Venous insufficiency (chronic) (peripheral): Secondary | ICD-10-CM

## 2014-06-21 DIAGNOSIS — I83893 Varicose veins of bilateral lower extremities with other complications: Secondary | ICD-10-CM

## 2014-06-21 DIAGNOSIS — I5021 Acute systolic (congestive) heart failure: Secondary | ICD-10-CM

## 2014-06-21 NOTE — Progress Notes (Signed)
Patient ID: Tara Garrett, female   DOB: September 26, 1922, 81 y.o.   MRN: [id number]   Location:  [facility]  Code Status:   Allergies  Allergen Reactions  . Mobic [Meloxicam]     Blood pressure goes up  . Tramadol     Blood pressure goes up     Chief Complaint  Patient presents with  . Leg Swelling    bilateral lower legs since last week.      HPI: Patient is a 81 y.o. white female seen in the office today for medical mgt of chronic diseases.  She also c/o bilateral leg pain.  Last Tuesday, she noticed that her legs were swollen.  Left is much more swollen that the right.  No change in breathing.  Thinks she may be running a temperature at night, but doesn't want to use her thermometer to check.  Has been sleeping 9-10 hrs.  Is out gardening if it is warm out.  She fell 08/23/13 and fractured 2 ribs.  She went to rehab for 3 weeks.  Had been walking with a cane at that point.  Now using rollator walker.  Gets tired when walking just within the house.  Does go to fitness class and walks down cement driveway.  Uses chair for support and 2 lb and 4 lb weights at the class (has them at home, but doesn't use there, only at the class).  No more hip pain.    Says she has lost 10 lbs in the past several months.  Doesn't use much salt.    Upon review of records, she has previously had LLE venous duplex done after she had her saphenous vein procedure and it was satisfactory.    Review of Systems:  Review of Systems  Constitutional: Positive for weight loss. Negative for fever and chills.  HENT: Negative for congestion.   Eyes: Negative for blurred vision.  Respiratory: Negative for shortness of breath.   Cardiovascular: Negative for chest pain.  Gastrointestinal: Negative for abdominal pain and constipation.  Genitourinary: Negative for dysuria.  Musculoskeletal: Negative for joint pain and falls.       Using rollator walker to ambulate;  No longer uses cane  Skin: Negative for rash.         Dark skin discoloration of legs  Neurological: Positive for weakness. Negative for dizziness, loss of consciousness and headaches.  Endo/Heme/Allergies: Bruises/bleeds easily.  Psychiatric/Behavioral: Positive for memory loss.       Had difficulty remembering date (1915)     Past Medical History  Diagnosis Date  . Varicose veins   . Hypothyroidism   . Vitamin D deficiency   . Hyperlipidemia   . Hypertension   . Shoulder pain, right 10/2009    X- rayed at Dr. Berenice Primas.Severe Degenerative Disease. Recommended an artificial shoulder. Saw Dr. Tamera Punt .  Marland Kitchen Right shoulder injury 12/2009  . Leg pain   . Hyperglycemia   . Macular degeneration   . Keratosis seborrheica   . Left knee pain   . Osteoporosis   . Neoplasm of breast     right breast cancer  . Fall 10-09-2011    fell outdoors on driveway fell and hit right side of body and face  . Pain in joint, pelvic region and thigh 05/11/2012  . Pain in joint, site unspecified 02/03/39  . Phlebitis and thrombophlebitis of other deep vessels of lower extremities 01/06/2012  . Other malaise and fatigue 01/06/2012  . Closed fracture of two ribs 10/21/2011,  08/24/2013  . Other specified circulatory system disorders 10/09/2011  . Open wound of knee, leg (except thigh), and ankle, without mention of complication 02/02/36  . Closed fracture of unspecified part of upper end of humerus 01/13/2005  . Female stress incontinence 02/10/2004  . Herpes zoster without mention of complication 04/88/8916  . Malignant neoplasm of breast (female), unspecified site   . Varicose vein 2014  . Multiple fractures of ribs of left side 08/24/2013  . Gait disorder 08/25/2013    Poor balance     Past Surgical History  Procedure Laterality Date  . Left cataract  1999  . Right mastectomy  1989  . Vesicovaginal fistula closure w/ tah  1967  . Colon polyps  1989, 1992, 1995, 1997  . Cataract extraction  04/2004    Dr. Bing Plume  . Mole removal  03/04/2009    on back by  Dr. Derrel Nip  . Endovenous ablation saphenous vein w/ laser  01-30-2012    left greater saphenous vein and sclerotherapy left leg  . Abdominal hysterectomy  1967    Social History:   reports that she quit smoking about 36 years ago. Her smoking use included Cigarettes. She smoked 0.00 packs per day. She has never used smokeless tobacco. She reports that she drinks alcohol. She reports that she does not use illicit drugs.  Family History  Problem Relation Age of Onset  . Cancer Mother     Medications: Patient's Medications  New Prescriptions   No medications on file  Previous Medications   ASPIRIN 325 MG EC TABLET    Take 325 mg by mouth once a week. On Tuesdays    CALCIUM CARB-CHOLECALCIFEROL (CALCIUM 500 +D) 500-400 MG-UNIT TABS    Take 1 tablet by mouth daily.    CHOLECALCIFEROL (VITAMIN D) 1000 UNITS TABLET    Take 1,000 Units by mouth daily.   FISH OIL-OMEGA-3 FATTY ACIDS 1000 MG CAPSULE    Take 1 g by mouth daily.   HYDROCHLOROTHIAZIDE (HYDRODIURIL) 25 MG TABLET    Take 1 tablet (25 mg total) by mouth daily.   IBUPROFEN (ADVIL,MOTRIN) 200 MG TABLET    Take 400 mg by mouth 2 (two) times daily as needed (Pain).    LOSARTAN (COZAAR) 50 MG TABLET    Take 1 tablet (50 mg total) by mouth daily.   MULTIPLE VITAMIN (MULTIVITAMIN) TABLET    Take 1 tablet by mouth daily.   MULTIPLE VITAMINS-MINERALS (ICAPS) CAPS    Take 1 capsule by mouth every other day. Take one daily alt with Protegra   MULTIPLE VITAMINS-MINERALS (PROTEGRA PO)    Take 1 tablet by mouth every other day. Alternating with I Caps  Modified Medications   No medications on file  Discontinued Medications   No medications on file     Physical Exam: Filed Vitals:   06/21/14 1416  BP: 112/72  Pulse: 53  Temp: 96.9 F (36.1 C)  TempSrc: Oral  Weight: 117 lb (53.071 kg)  SpO2: 96%  Physical Exam  Constitutional: She appears well-developed and well-nourished. No distress.  Cardiovascular: Intact distal pulses.     irreg irreg; left 2+ pitting edema, right 1+ pitting edema distal lower leg  Pulmonary/Chest: Effort normal and breath sounds normal. She has no rales.  Abdominal: Soft. Bowel sounds are normal.  Musculoskeletal: Normal range of motion.  Walks with rollator walker  Neurological: She is alert.  Oriented to person and place, not precise date  Skin: Skin is warm and dry.  Brownish discoloration  of anterior shin regions bilaterally  Psychiatric: She has a normal mood and affect.    Labs reviewed: Basic Metabolic Panel:  Recent Labs  01/27/14  NA 123*  K 4.3  BUN 10  CREATININE 0.4*  TSH 3.76   Liver Function Tests:  Recent Labs  01/27/14  AST 25  ALT 17  ALKPHOS 50   No results for input(s): LIPASE, AMYLASE in the last 8760 hours. No results for input(s): AMMONIA in the last 8760 hours. CBC: No results for input(s): WBC, NEUTROABS, HGB, HCT, MCV, PLT in the last 8760 hours. Lipid Panel:  Recent Labs  01/27/14  CHOL 178  HDL 93*  LDLCALC 74  TRIG 41   Lab Results  Component Value Date   HGBA1C 5.1 01/27/2014    Past Procedures: Prior venous duplex reviewed No EKG or echo on chart EKG done today:  Sinus tachy at 106; PACs; suggests old anteroseptal MI  Assessment/Plan 1. Varicose veins of lower extremity with edema, bilateral -appears to be primary cause of her edema, but want to r/o cardiac cause  2. Acute systolic heart failure -echo ordered (none on record) to assess cardiac function due to new onset bilateral LE pitting edema  3. Venous insufficiency (chronic) (peripheral) -advised to elevate legs at rest -may need compression hose  Labs/tests ordered:  EKG; echo Next appt:  Brindley Madarang L. Kwame Ryland, D.O. [facility] [address], [place] [address] Cell Phone (Mon-Fri 8am-5pm):  [phone] On Call:  [phone] & follow prompts after 5pm & weekends Office Phone:  [phone] Office Fax:   [phone]

## 2014-07-13 ENCOUNTER — Encounter: Payer: Self-pay | Admitting: Nurse Practitioner

## 2014-07-13 ENCOUNTER — Non-Acute Institutional Stay: Payer: Medicare Other | Admitting: Nurse Practitioner

## 2014-07-13 VITALS — BP 118/68 | HR 80 | Temp 97.5°F | Wt 119.0 lb

## 2014-07-13 DIAGNOSIS — L03119 Cellulitis of unspecified part of limb: Secondary | ICD-10-CM

## 2014-07-13 DIAGNOSIS — M549 Dorsalgia, unspecified: Secondary | ICD-10-CM

## 2014-07-13 DIAGNOSIS — I5021 Acute systolic (congestive) heart failure: Secondary | ICD-10-CM

## 2014-07-13 DIAGNOSIS — I10 Essential (primary) hypertension: Secondary | ICD-10-CM

## 2014-07-13 MED ORDER — DOXYCYCLINE HYCLATE 100 MG PO TABS: 100.0000 mg | ORAL_TABLET | Freq: Two times a day (BID) | ORAL | Status: DC

## 2014-07-13 MED ORDER — ACETAMINOPHEN 500 MG PO TABS: 500.0000 mg | ORAL_TABLET | Freq: Four times a day (QID) | ORAL | Status: DC | PRN

## 2014-07-13 MED ORDER — LOSARTAN POTASSIUM 50 MG PO TABS: 50.0000 mg | ORAL_TABLET | Freq: Every day | ORAL | Status: DC

## 2014-07-13 NOTE — Progress Notes (Signed)
Patient ID: Tara Garrett, female   DOB: March 24, 1923, 81 y.o.   MRN: [id number]    Nursing Home Location:  [facility] (12)  PCP: REED, TIFFANY, DO  Allergies  Allergen Reactions  . Mobic [Meloxicam]     Blood pressure goes up  . Tramadol     Blood pressure goes up     Chief Complaint  Patient presents with  . Leg Swelling    right leg has fluid draining  . Back Pain    mid back    HPI:  Patient is a 81 y.o. female seen today at [facility] who presents today with chief complaint of leg swelling. Swelling started 4-5 days ago and is worse in the left leg. She reports that the legs are tender to touch.  She is still able to walk with her walker without any problems.  Nursing reports that her legs have been weeping fluid and she wrapped them twice yesterday which helped.  She has not taken anything for the swelling or pain but reports that she is taking her hydrocholorothiazide daily. However, the bottle has not been filled since last year and clinic nurse did home visit and does not believe she is taking medication regularly. Patient also complains of dull, intermittent back pain. She currently takes ibuprofen daily for the pain.   Pt is a very poor historian as well. Pt denies chest pains , shortness of breath however clinic nurse reported yesterday pt was with increased shortness of breath during visit.  Review of Systems:  Review of Systems  Constitutional: Negative for fever, chills and fatigue.  Respiratory: Negative for cough, shortness of breath and wheezing.   Cardiovascular: Positive for leg swelling. Negative for chest pain and palpitations.  Musculoskeletal: Positive for back pain. Negative for joint swelling and gait problem.  Skin: Negative for color change, pallor, rash and wound.  Neurological: Negative for weakness, light-headedness and headaches.    Past Medical History  Diagnosis Date  .  Varicose veins   . Hypothyroidism   . Vitamin D deficiency   . Hyperlipidemia   . Hypertension   . Shoulder pain, right 10/2009    X- rayed at Dr. Berenice Primas.Severe Degenerative Disease. Recommended an artificial shoulder. Saw Dr. Tamera Punt .  Marland Kitchen Right shoulder injury 12/2009  . Leg pain   . Hyperglycemia   . Macular degeneration   . Keratosis seborrheica   . Left knee pain   . Osteoporosis   . Neoplasm of breast     right breast cancer  . Fall 10-09-2011    fell outdoors on driveway fell and hit right side of body and face  . Pain in joint, pelvic region and thigh 05/11/2012  . Pain in joint, site unspecified 02/03/39  . Phlebitis and thrombophlebitis of other deep vessels of lower extremities 01/06/2012  . Other malaise and fatigue 01/06/2012  . Closed fracture of two ribs 10/21/2011, 08/24/2013  . Other specified circulatory system disorders 10/09/2011  . Open wound of knee, leg (except thigh), and ankle, without mention of complication 0/53/9767  . Closed fracture of unspecified part of upper end of humerus 01/13/2005  . Female stress incontinence 02/10/2004  . Herpes zoster without mention of complication 34/19/3790  . Malignant neoplasm of breast (female), unspecified site   . Varicose vein 2014  . Multiple fractures of ribs of left side 08/24/2013  . Gait disorder 08/25/2013    Poor balance    Past  Surgical History  Procedure Laterality Date  . Left cataract  1999  . Right mastectomy  1989  . Vesicovaginal fistula closure w/ tah  1967  . Colon polyps  1989, 1992, 1995, 1997  . Cataract extraction  04/2004    Dr. Bing Plume  . Mole removal  03/04/2009    on back by Dr. Derrel Nip  . Endovenous ablation saphenous vein w/ laser  01-30-2012    left greater saphenous vein and sclerotherapy left leg  . Abdominal hysterectomy  1967   Social History:   reports that she quit smoking about 36 years ago. Her smoking use included Cigarettes. She has never used smokeless tobacco. She reports that she  drinks alcohol. She reports that she does not use illicit drugs.  Family History  Problem Relation Age of Onset  . Cancer Mother     Medications: Patient's Medications  New Prescriptions   No medications on file  Previous Medications   ASPIRIN 325 MG EC TABLET    Take 325 mg by mouth once a week. On Tuesdays    CALCIUM CARB-CHOLECALCIFEROL (CALCIUM 500 +D) 500-400 MG-UNIT TABS    Take 1 tablet by mouth daily.    CHOLECALCIFEROL (VITAMIN D) 1000 UNITS TABLET    Take 1,000 Units by mouth daily.   FISH OIL-OMEGA-3 FATTY ACIDS 1000 MG CAPSULE    Take 1 g by mouth daily.   HYDROCHLOROTHIAZIDE (HYDRODIURIL) 25 MG TABLET    Take 1 tablet (25 mg total) by mouth daily.   IBUPROFEN (ADVIL,MOTRIN) 200 MG TABLET    Take 400 mg by mouth 2 (two) times daily as needed (Pain).    LOSARTAN (COZAAR) 50 MG TABLET    Take 1 tablet (50 mg total) by mouth daily.   MULTIPLE VITAMIN (MULTIVITAMIN) TABLET    Take 1 tablet by mouth daily.   MULTIPLE VITAMINS-MINERALS (ICAPS) CAPS    Take 1 capsule by mouth every other day. Take one daily alt with Protegra   MULTIPLE VITAMINS-MINERALS (PROTEGRA PO)    Take 1 tablet by mouth every other day. Alternating with I Caps  Modified Medications   No medications on file  Discontinued Medications   No medications on file     Physical Exam: Filed Vitals:   07/13/14 1011  BP: 118/68  Pulse: 80  Temp: 97.5 F (36.4 C)  TempSrc: Oral  Weight: 119 lb (53.978 kg)  SpO2: 90%    Physical Exam  Constitutional: She appears well-developed and well-nourished.  HENT:  Head: Normocephalic and atraumatic.  Cardiovascular: Normal rate, regular rhythm and normal heart sounds.   Pulmonary/Chest: Effort normal. She has decreased breath sounds (on right side).  Musculoskeletal: She exhibits edema.  Bilaterally legs swollen with oozing at multiple sites. Left lower leg is worse than right-- pt with increased swelling, heat and warmth.   Neurological: She is alert.  Skin:  There is erythema (to bilaterally LE).    Labs reviewed: Basic Metabolic Panel:  Recent Labs  01/27/14  NA 123*  K 4.3  BUN 10  CREATININE 0.4*   Liver Function Tests:  Recent Labs  01/27/14  AST 25  ALT 17  ALKPHOS 50   No results for input(s): LIPASE, AMYLASE in the last 8760 hours. No results for input(s): AMMONIA in the last 8760 hours. CBC: No results for input(s): WBC, NEUTROABS, HGB, HCT, MCV, PLT in the last 8760 hours. TSH:  Recent Labs  01/27/14  TSH 3.76   A1C: Lab Results  Component Value Date  HGBA1C 5.1 01/27/2014   Lipid Panel:  Recent Labs  01/27/14  CHOL 178  HDL 93*  LDLCALC 74  TRIG 41     Assessment/Plan    1. Cellulitis of lower extremity, unspecified laterality -worsening edema, drainage with warmth. Will treat for cellulitis and have clinic nurse wrap LE twice  -to take HCTZ as prescribed.  - doxycycline (VIBRA-TABS) 100 MG tablet; Take 1 tablet (100 mg total) by mouth 2 (two) times daily.  Dispense: 20 tablet; Refill: 0 -also encouraged proper nutrition, will get CMP at this time  2. Essential hypertension -blood pressure stable - losartan (COZAAR) 50 MG tablet; Take 1 tablet (50 mg total) by mouth daily.  Dispense: 30 tablet; Refill: 11  3. Back pain, unspecified location -to stop advil due to age and worsening edema) may use acetaminophen (TYLENOL) 500 MG tablet; Take 1 tablet (500 mg total) by mouth every 6 (six) hours as needed.  Dispense: 30 tablet; Refill: 0  4. Acute systolic heart failure -pt was seen by Dr Mariea Clonts last month and echo ordered. Pt currently not taking diuretic appropriately, will worsening shortness of breath will get chest xray at this time.

## 2014-07-14 ENCOUNTER — Other Ambulatory Visit (HOSPITAL_COMMUNITY): Payer: Self-pay | Admitting: Internal Medicine

## 2014-07-14 ENCOUNTER — Ambulatory Visit (HOSPITAL_COMMUNITY): Payer: Medicare Other | Attending: Cardiology | Admitting: Radiology

## 2014-07-14 DIAGNOSIS — I872 Venous insufficiency (chronic) (peripheral): Secondary | ICD-10-CM

## 2014-07-14 DIAGNOSIS — I5021 Acute systolic (congestive) heart failure: Secondary | ICD-10-CM

## 2014-07-14 NOTE — Progress Notes (Signed)
Echocardiogram performed.  

## 2014-07-15 ENCOUNTER — Telehealth: Payer: Self-pay

## 2014-07-15 DIAGNOSIS — R931 Abnormal findings on diagnostic imaging of heart and coronary circulation: Secondary | ICD-10-CM

## 2014-07-15 MED ORDER — FUROSEMIDE 40 MG PO TABS: ORAL_TABLET | ORAL | Status: DC

## 2014-07-15 MED ORDER — POTASSIUM CHLORIDE CRYS ER 20 MEQ PO TBCR: EXTENDED_RELEASE_TABLET | ORAL | Status: DC

## 2014-07-15 NOTE — Telephone Encounter (Signed)
Spoke with patient about 2D Echo, fax Lasix 40mg  and kcl 64meg to [facility] stop HCTZ, patient wrote this information down.  Put in  Order for referral to cardiologist. Patient wants appt around 11:00. Print copy for patient.

## 2014-07-15 NOTE — Telephone Encounter (Signed)
-----   Message from [name], DO sent at 07/15/2014  1:43 PM EST ----- Tara Garrett has advanced congestive heart failure--the heart is not pumping the way it should and also not relaxing as it should.  I would like her to stop hctz and start lasix 40mg  daily and kcl 21meq daily.  I would also like her to see a cardiologist for additional management.

## 2014-07-19 ENCOUNTER — Encounter: Payer: Self-pay | Admitting: Internal Medicine

## 2014-07-20 ENCOUNTER — Non-Acute Institutional Stay: Payer: Medicare Other | Admitting: Nurse Practitioner

## 2014-07-20 ENCOUNTER — Other Ambulatory Visit: Payer: Self-pay

## 2014-07-20 ENCOUNTER — Encounter: Payer: Self-pay | Admitting: Nurse Practitioner

## 2014-07-20 VITALS — BP 132/82 | HR 64 | Temp 97.6°F | Wt 118.0 lb

## 2014-07-20 DIAGNOSIS — I5021 Acute systolic (congestive) heart failure: Secondary | ICD-10-CM

## 2014-07-20 DIAGNOSIS — I10 Essential (primary) hypertension: Secondary | ICD-10-CM

## 2014-07-20 DIAGNOSIS — L03119 Cellulitis of unspecified part of limb: Secondary | ICD-10-CM

## 2014-07-20 DIAGNOSIS — M549 Dorsalgia, unspecified: Secondary | ICD-10-CM

## 2014-07-20 NOTE — Progress Notes (Signed)
Patient ID: Tara Garrett, female   DOB: 12-Jul-1922, 81 y.o.   MRN: [id number]    Nursing Home Location:  [facility] (12)  PCP: REED, TIFFANY, DO  Allergies  Allergen Reactions  . Mobic [Meloxicam]     Blood pressure goes up  . Tramadol     Blood pressure goes up     Chief Complaint  Patient presents with  . Medical Management of Chronic Issues    one week follow-up on right leg. Here with BJ [name] and Rudene Christians -friends and POA  . Congestive Heart Failure    07/14/14 from 2D Echo  . Back Pain    mid-back for weeks  . Shortness of Breath    last night     HPI:  Patient is a 81 y.o. female seen today at [facility] who presents today to follow up cellulitis. She was prescribed doxycycline and is unable to recall if she is taking it but legs are doing better. Not as red, less drainage. Using TED hose.   She was told to stop HCTZ and start taking the furosemide.  Her HPOA is present and is concerned that she is not taking her medicine correctly. Pt reports she is not taking her medication because she is also not eating. Does not have anything in her refrigerator. Memory decline is also evident.  Echo was done last week and revealed an EF of 15-20% with diffuse hypokinesis and grade 2 diastolic function.      Review of Systems:  Review of Systems  Constitutional: Negative for fever, chills and fatigue.  HENT: Negative for congestion, rhinorrhea and sore throat.   Respiratory: Positive for shortness of breath. Negative for cough and wheezing.   Cardiovascular: Positive for leg swelling. Negative for chest pain and palpitations.  Musculoskeletal: Negative for joint swelling and gait problem.  Skin: Negative for color change, pallor, rash and wound.  Neurological: Negative for weakness, light-headedness and headaches.    Past Medical History  Diagnosis Date  . Varicose veins   . Hypothyroidism   .  Vitamin D deficiency   . Hyperlipidemia   . Hypertension   . Shoulder pain, right 10/2009    X- rayed at Dr. Berenice Primas.Severe Degenerative Disease. Recommended an artificial shoulder. Saw Dr. Tamera Punt .  Marland Kitchen Right shoulder injury 12/2009  . Leg pain   . Hyperglycemia   . Macular degeneration   . Keratosis seborrheica   . Left knee pain   . Osteoporosis   . Neoplasm of breast     right breast cancer  . Fall 10-09-2011    fell outdoors on driveway fell and hit right side of body and face  . Pain in joint, pelvic region and thigh 05/11/2012  . Pain in joint, site unspecified 02/03/39  . Phlebitis and thrombophlebitis of other deep vessels of lower extremities 01/06/2012  . Other malaise and fatigue 01/06/2012  . Closed fracture of two ribs 10/21/2011, 08/24/2013  . Other specified circulatory system disorders 10/09/2011  . Open wound of knee, leg (except thigh), and ankle, without mention of complication 09/26/6220  . Closed fracture of unspecified part of upper end of humerus 01/13/2005  . Female stress incontinence 02/10/2004  . Herpes zoster without mention of complication 97/98/9211  . Malignant neoplasm of breast (female), unspecified site   . Varicose vein 2014  . Multiple fractures of ribs of left side 08/24/2013  . Gait disorder 08/25/2013    Poor  balance    Past Surgical History  Procedure Laterality Date  . Left cataract  1999  . Right mastectomy  1989  . Vesicovaginal fistula closure w/ tah  1967  . Colon polyps  1989, 1992, 1995, 1997  . Cataract extraction  04/2004    Dr. Bing Plume  . Mole removal  03/04/2009    on back by Dr. Derrel Nip  . Endovenous ablation saphenous vein w/ laser  01-30-2012    left greater saphenous vein and sclerotherapy left leg  . Abdominal hysterectomy  1967   Social History:   reports that she quit smoking about 36 years ago. Her smoking use included Cigarettes. She has never used smokeless tobacco. She reports that she drinks alcohol. She reports that she  does not use illicit drugs.  Family History  Problem Relation Age of Onset  . Cancer Mother     Medications: Patient's Medications  New Prescriptions   No medications on file  Previous Medications   ACETAMINOPHEN (TYLENOL) 500 MG TABLET    Take 1 tablet (500 mg total) by mouth every 6 (six) hours as needed.   ASPIRIN 325 MG EC TABLET    Take 325 mg by mouth once a week. On Tuesdays    CALCIUM CARB-CHOLECALCIFEROL (CALCIUM 500 +D) 500-400 MG-UNIT TABS    Take 1 tablet by mouth daily.    CHOLECALCIFEROL (VITAMIN D) 1000 UNITS TABLET    Take 1,000 Units by mouth daily.   DOXYCYCLINE (VIBRA-TABS) 100 MG TABLET    Take 1 tablet (100 mg total) by mouth 2 (two) times daily.   FISH OIL-OMEGA-3 FATTY ACIDS 1000 MG CAPSULE    Take 1 g by mouth daily.   FUROSEMIDE (LASIX) 40 MG TABLET    Take one tablet in morning for fluid   LOSARTAN (COZAAR) 50 MG TABLET    Take 1 tablet (50 mg total) by mouth daily.   MULTIPLE VITAMINS-MINERALS (ICAPS) CAPS    Take 1 capsule by mouth every other day. Take one daily alt with Protegra   POTASSIUM CHLORIDE SA (K-DUR,KLOR-CON) 20 MEQ TABLET    Take one tablet in morning for potassium  Modified Medications   No medications on file  Discontinued Medications   No medications on file     Physical Exam: Filed Vitals:   07/20/14 1044  BP: 132/82  Pulse: 64  Temp: 97.6 F (36.4 C)  TempSrc: Oral  Weight: 118 lb (53.524 kg)  SpO2: 99%    Physical Exam  Constitutional: She appears well-developed and well-nourished.  HENT:  Head: Normocephalic and atraumatic.  Cardiovascular: Normal rate, regular rhythm and normal heart sounds.   Pulmonary/Chest: Effort normal. She has decreased breath sounds (on right side).  Musculoskeletal: She exhibits edema.  Bilateral legs have +2 pitting edema present at tibia and bilateral ankles.  Skin is thin, dry and flaky.    Neurological: She is alert.  Skin: No rash noted. No erythema.    Labs reviewed: Basic Metabolic  Panel:  Recent Labs  01/27/14  NA 123*  K 4.3  BUN 10  CREATININE 0.4*   Liver Function Tests:  Recent Labs  01/27/14  AST 25  ALT 17  ALKPHOS 50   No results for input(s): LIPASE, AMYLASE in the last 8760 hours. No results for input(s): AMMONIA in the last 8760 hours. CBC: No results for input(s): WBC, NEUTROABS, HGB, HCT, MCV, PLT in the last 8760 hours. TSH:  Recent Labs  01/27/14  TSH 3.76   A1C: Lab Results  Component Value Date   HGBA1C 5.1 01/27/2014   Lipid Panel:  Recent Labs  01/27/14  CHOL 178  HDL 93*  LDLCALC 74  TRIG 41     Assessment/Plan   1. Cellulitis of lower extremity, unspecified laterality Improved, family is going to follow up and look at pill bottle to see if she is taking doxycycline.    2. Essential hypertension Stable on meds.  Continue same.    3. Back pain, unspecified location Continue using tylenol as needed.    4. Acute systolic heart failure Will admit patient to rehab.  Pt has not started the furosemide, will start this on admission to rehab and record daily weights.  Patient does report shortness of breath, denies cough. Will get BMP today and repeat in a week  She has an appointment with Dr. Mare Ferrari with cardiology in March.  Discussed with patient her desires for end of life.  She wishes to be a do not resuscitate.  This order will be added to her chart.   Marland Kitchen

## 2014-07-21 ENCOUNTER — Encounter: Payer: Self-pay | Admitting: Cardiology

## 2014-07-21 LAB — BASIC METABOLIC PANEL
BUN: 19 mg/dL (ref 4–21)
Creatinine: 0.9 mg/dL (ref 0.5–1.1)
Glucose: 88 mg/dL
Potassium: 3 mmol/L — AB (ref 3.4–5.3)
Sodium: 131 mmol/L — AB (ref 137–147)

## 2014-07-25 ENCOUNTER — Encounter: Payer: Self-pay | Admitting: Internal Medicine

## 2014-07-26 ENCOUNTER — Non-Acute Institutional Stay (SKILLED_NURSING_FACILITY): Payer: Medicare Other | Admitting: Internal Medicine

## 2014-07-26 ENCOUNTER — Encounter: Payer: Self-pay | Admitting: Cardiology

## 2014-07-26 DIAGNOSIS — I10 Essential (primary) hypertension: Secondary | ICD-10-CM

## 2014-07-26 DIAGNOSIS — E039 Hypothyroidism, unspecified: Secondary | ICD-10-CM

## 2014-07-26 DIAGNOSIS — M545 Low back pain, unspecified: Secondary | ICD-10-CM | POA: Insufficient documentation

## 2014-07-26 DIAGNOSIS — M810 Age-related osteoporosis without current pathological fracture: Secondary | ICD-10-CM

## 2014-07-26 DIAGNOSIS — R413 Other amnesia: Secondary | ICD-10-CM

## 2014-07-26 DIAGNOSIS — R269 Unspecified abnormalities of gait and mobility: Secondary | ICD-10-CM

## 2014-07-26 DIAGNOSIS — I5043 Acute on chronic combined systolic (congestive) and diastolic (congestive) heart failure: Secondary | ICD-10-CM | POA: Insufficient documentation

## 2014-07-26 DIAGNOSIS — I8310 Varicose veins of unspecified lower extremity with inflammation: Secondary | ICD-10-CM

## 2014-07-26 LAB — CBC AND DIFFERENTIAL
HEMATOCRIT: 40 % (ref 36–46)
Hemoglobin: 13.2 g/dL (ref 12.0–16.0)
Platelets: 205 10*3/uL (ref 150–399)
WBC: 7.7 10*3/mL

## 2014-07-26 LAB — BASIC METABOLIC PANEL
BUN: 21 mg/dL (ref 4–21)
Creatinine: 1.3 mg/dL — AB (ref 0.5–1.1)
Glucose: 98 mg/dL
POTASSIUM: 3.7 mmol/L (ref 3.4–5.3)
Sodium: 135 mmol/L — AB (ref 137–147)

## 2014-07-26 NOTE — Progress Notes (Signed)
Patient ID: Tara Garrett, female   DOB: Dec 28, 1922, 81 y.o.   MRN: [id number]  Provider:  Rexene Edison. Mariea Clonts, D.O., C.M.D.  Location:  [facility]  PCP: Hollace Kinnier, DO  Code Status: DNR; has living will and hcpoa  Allergies  Allergen Reactions  . Mobic [Meloxicam]     Blood pressure goes up  . Tramadol     Blood pressure goes up     Chief Complaint  Patient presents with  . New Admit To SNF    new admission to rehab due to acute chf, weakness, inability to care for self     HPI: 81 y.o. female with h/o htn, chronic venous stasis dermatitis, hyperglycemia, hypothyroidism, senile osteoporosis, vitamin D deficiency, stress incontinence, prior right-sided breast cancer, prior rib fractures and chronic low back pain was admitted to rehab due to inability to care for herself in her home environment due to weakness in the context of acute systolic and diastolic chf.  She's had increased edema of her legs over the past month.  This has gotten acutely worse in the past week, with weeping of ulcers on her legs.  She was treated with doxycycline for cellulitis which helped with redness of the legs.  She is using ted hose.  Her HCTZ was stopped and she was started on lasix.  Her HCPOA and friend is concerned that she has not taken these meds correctly at home and is not eating well--she had nothing in her fridge.  Her memory has declined significantly.  Echo I ordered showed severe systolic dysfunction with EF 15-20% with diffuse hypokinesis and grade 2 diastolic dysfunction.  Apparently, she was not taking the lasix I ordered at home.  She has an appt with cardiology in March 08/09/14 at 11am due to her echo results.    When seen, she admitted to feeling better than she had been at home.  She is eating more and better here. She does not think her edema has gotten any better and her weight has actually gone up 1.5lbs.  She denies shortness of breath, but has not been very active.  She is noted  to be tachycardic much of the time, but in sinus.    ROS: Review of Systems  Constitutional: Positive for malaise/fatigue. Negative for fever, chills and weight loss.  HENT: Negative for congestion.   Respiratory: Negative for shortness of breath.   Cardiovascular: Positive for leg swelling. Negative for chest pain.  Gastrointestinal: Negative for abdominal pain and constipation.  Genitourinary: Negative for dysuria, urgency and frequency.  Musculoskeletal: Negative for falls.  Skin:       Blisters of legs from severe edema and fluid sequestration  Neurological: Positive for weakness. Negative for dizziness.  Psychiatric/Behavioral: Positive for memory loss.     Past Medical History  Diagnosis Date  . Varicose veins   . Hypothyroidism   . Vitamin D deficiency   . Hyperlipidemia   . Hypertension   . Shoulder pain, right 10/2009    X- rayed at Dr. Berenice Primas.Severe Degenerative Disease. Recommended an artificial shoulder. Saw Dr. Tamera Punt .  Marland Kitchen Right shoulder injury 12/2009  . Leg pain   . Hyperglycemia   . Macular degeneration   . Keratosis seborrheica   . Left knee pain   . Osteoporosis   . Neoplasm of breast     right breast cancer  . Fall 10-09-2011    fell outdoors on driveway fell and hit right side of body and face  .  Pain in joint, pelvic region and thigh 05/11/2012  . Pain in joint, site unspecified 02/03/39  . Phlebitis and thrombophlebitis of other deep vessels of lower extremities 01/06/2012  . Other malaise and fatigue 01/06/2012  . Closed fracture of two ribs 10/21/2011, 08/24/2013  . Other specified circulatory system disorders 10/09/2011  . Open wound of knee, leg (except thigh), and ankle, without mention of complication 5/91/6384  . Closed fracture of unspecified part of upper end of humerus 01/13/2005  . Female stress incontinence 02/10/2004  . Herpes zoster without mention of complication 66/59/9357  . Malignant neoplasm of breast (female), unspecified site   . Varicose  vein 2014  . Multiple fractures of ribs of left side 08/24/2013  . Gait disorder 08/25/2013    Poor balance    Past Surgical History  Procedure Laterality Date  . Left cataract  1999  . Right mastectomy  1989  . Vesicovaginal fistula closure w/ tah  1967  . Colon polyps  1989, 1992, 1995, 1997  . Cataract extraction  04/2004    Dr. Bing Plume  . Mole removal  03/04/2009    on back by Dr. Derrel Nip  . Endovenous ablation saphenous vein w/ laser  01-30-2012    left greater saphenous vein and sclerotherapy left leg  . Abdominal hysterectomy  1967   Social History:   reports that she quit smoking about 36 years ago. Her smoking use included Cigarettes. She has never used smokeless tobacco. She reports that she drinks alcohol. She reports that she does not use illicit drugs.  Family History  Problem Relation Age of Onset  . Cancer Mother     Medications: Patient's Medications  New Prescriptions   No medications on file  Previous Medications   ACETAMINOPHEN (TYLENOL) 500 MG TABLET    Take 1 tablet (500 mg total) by mouth every 6 (six) hours as needed.   ASPIRIN 325 MG EC TABLET    Take 325 mg by mouth once a week. On Tuesdays    CALCIUM CARB-CHOLECALCIFEROL (CALCIUM 500 +D) 500-400 MG-UNIT TABS    Take 1 tablet by mouth daily.    CHOLECALCIFEROL (VITAMIN D) 1000 UNITS TABLET    Take 2,000 Units by mouth daily.   FISH OIL-OMEGA-3 FATTY ACIDS 1000 MG CAPSULE    Take 1 g by mouth daily.   FUROSEMIDE (LASIX) 40 MG TABLET    Take one tablet in morning for fluid   LOSARTAN (COZAAR) 50 MG TABLET    Take 1 tablet (50 mg total) by mouth daily.   MULTIPLE VITAMINS-MINERALS (ICAPS) CAPS    Take 1 capsule by mouth every other day. Take one daily alt with Protegra   POTASSIUM CHLORIDE SA (K-DUR,KLOR-CON) 20 MEQ TABLET    Take one tablet in morning for potassium  Modified Medications   No medications on file  Discontinued Medications   No medications on file     Physical Exam: Filed Vitals:     07/26/14 1032  BP: 112/68  Pulse: 110  Temp: 96.4 F (35.8 C)  Resp: 16  Height: 4\' 9"  (1.448 m)  Weight: 120 lb 6.4 oz (54.613 kg)  SpO2: 95%  Physical Exam  Constitutional: She is oriented to person, place, and time. She appears well-developed and well-nourished. No distress.  Seated in her recliner chair in her room in her PJs and robe; legs are wrapped with kerlix where ulcers have been draining  Cardiovascular: Regular rhythm, normal heart sounds and intact distal pulses.   tachycardic  Pulmonary/Chest:  Effort normal. She has rales.  Abdominal: Soft. Bowel sounds are normal. She exhibits no distension and no mass. There is no tenderness.  Musculoskeletal: Normal range of motion.  Neurological: She is alert and oriented to person, place, and time.  Skin:  Superficial ulcers of bilateral LEs with weeping;  No erythema or foul odor, clear drainage  Psychiatric: She has a normal mood and affect.     Labs reviewed: Basic Metabolic Panel:  Recent Labs  07/21/14 07/26/14 07/28/14  NA 131* 135* 132*  K 3.0* 3.7 3.8  BUN 19 21 20   CREATININE 0.9 1.3* 0.8   Liver Function Tests:  Recent Labs  01/27/14  AST 25  ALT 17  ALKPHOS 50   Imaging and Procedures: Echo 07/14/14 reviewed EKG 06/22/14 with HR 106, sinus tachy, PACs, anteroseptal MI of undetermined age  Assessment/Plan 1. Varicose veins of lower extremities with inflammation, unspecified laterality -has baseline chronic venous insufficiency now exacerbated by acute on chronic systolic and diastolic chf which is actually new within the past month -cont to elevate feet at rest and use teds as tolerates during the day -cont dressing to open ulcers with nonadherent means of securing, monitor for infection  2. Essential hypertension -bp on low side with losartan -tachycardic, but regular -due to the tachy and elevated cr, will hold losartan, and begin coreg 3.125mg  po bid given #3  3. Acute on chronic combined  systolic and diastolic heart failure -cause of this decompensation not clear, but EF 40% with diastolic dysfunction, as well, on echo -weight going the wrong direction and edema not improved much (only from elevating feet, drainage of ulcers persists) -is already receiving lasix 40mg  daily with potassium 29meq daily -bmp results to be called to me after holding losartan for 2 days -may be a good entresto candidate if bp tolerates and after washout of losartan if cardiology agrees  4. Hypothyroidism, unspecified hypothyroidism type -TSH was normal in august 2015 and she is not on synthroid  5. Senile osteoporosis -cont ca with D and additional D (probably needs 2000 units not 1000)  6. Memory loss -this and her acute chf were interfering with her ability to take care of herself, was having difficulty managing meds at home -No flowsheet data found. for MMSE, but should have one here while in rehab  7. Gait disorder -here for therapy with PT, OT  8. Midline low back pain without sciatica -using heating pad with benefit and tylenol  Functional status:  Currently requiring adl assist except with eating meals  Family/ staff Communication: discussed with her nurse  Labs/tests ordered:  Bmp 2/18 to check renal function--would give lasix and kcl bid (breakfast and lunch) for 3 days if can tolerate and weight still not coming down

## 2014-07-27 ENCOUNTER — Telehealth: Payer: Self-pay

## 2014-07-27 NOTE — Telephone Encounter (Signed)
-----   Message from Lauree Chandler, NP sent at 07/16/2014 10:45 AM EST ----- Pt did not get chest xray can we call her and see if she can go get this done before her appt next week.  ----- Message -----    From: SYSTEM    Sent: 07/14/2014  12:04 AM      To: Lauree Chandler, NP

## 2014-07-27 NOTE — Telephone Encounter (Signed)
Patient was seen in clinic by [facility] on 07/20/14, patient didn't go get the chest x-ray the same day she had her 2D Echo, because she didn't have it written down, she remembers me talking to her about it, just didn't go. Was admitted to Rehab. From clinic.

## 2014-07-28 ENCOUNTER — Encounter: Payer: Self-pay | Admitting: Adult Health

## 2014-07-28 ENCOUNTER — Non-Acute Institutional Stay (SKILLED_NURSING_FACILITY): Payer: Medicare Other | Admitting: Adult Health

## 2014-07-28 DIAGNOSIS — I5043 Acute on chronic combined systolic (congestive) and diastolic (congestive) heart failure: Secondary | ICD-10-CM | POA: Diagnosis not present

## 2014-07-28 DIAGNOSIS — G4700 Insomnia, unspecified: Secondary | ICD-10-CM | POA: Diagnosis not present

## 2014-07-28 DIAGNOSIS — I10 Essential (primary) hypertension: Secondary | ICD-10-CM | POA: Diagnosis not present

## 2014-07-28 DIAGNOSIS — E871 Hypo-osmolality and hyponatremia: Secondary | ICD-10-CM | POA: Diagnosis not present

## 2014-07-28 LAB — BASIC METABOLIC PANEL
BUN: 20 mg/dL (ref 4–21)
Creatinine: 0.8 mg/dL (ref 0.5–1.1)
Glucose: 94 mg/dL
Potassium: 3.8 mmol/L (ref 3.4–5.3)
Sodium: 132 mmol/L — AB (ref 137–147)

## 2014-07-28 NOTE — Progress Notes (Signed)
Patient ID: Tara Garrett, female   DOB: 1923-05-22, 81 y.o.   MRN: [id number]   Location:  [facility]  Code Status: DNR; has living will and hcpoa  Allergies  Allergen Reactions  . Mobic [Meloxicam]     Blood pressure goes up  . Tramadol     Blood pressure goes up     Chief Complaint  Patient presents with  . Acute Visit    increased weight    HPI: 81 y.o. female with h/o htn, chronic venous stasis dermatitis, hyperglycemia, hypothyroidism, senile osteoporosis, vitamin D deficiency, stress incontinence, prior right-sided breast cancer, prior rib fractures and chronic low back pain was admitted to rehab due to inability to care for herself in her home environment due to weakness in the context of acute systolic and diastolic chf. Echo (02/10/89) showed severe systolic dysfunction with EF 15-20% with diffuse hypokinesis and grade 2 diastolic dysfunction.   Update: I was asked to see her today due to a 4 lb weight gain in 2 days. She denies increase swelling, increased SOB, DOE, PND, CP, dizziness, syncope, etc. She reports that prior to coming to rehab that she was not eating well and since her appetite has improved. The weights on the scales have been variable since her arrival. In addition, on 07/26/14 losartan was held due a Cr rise to 1.3 on 07/26/14 from 0.4 on 01/27/14.  As of 07/28/2014 her Cr has decreased to 0.78. Losartan remains on hold.  She was placed on Coreg as well on 07/26/14 for an elevated HR. Her HR has ranged 84-88 over the past 48 hrs. Her BP has ranged 114-138/62-70.   Review of Systems  Constitutional: Negative for fever, chills, weight loss, malaise/fatigue and diaphoresis.  Respiratory: Negative for cough, sputum production, shortness of breath and wheezing.        DOE  Cardiovascular: Positive for leg swelling. Negative for chest pain, palpitations, orthopnea, claudication and PND.  Gastrointestinal: Negative for heartburn, nausea, vomiting, abdominal  pain and constipation.  Genitourinary: Negative for dysuria and frequency.  Musculoskeletal: Positive for back pain. Negative for joint pain and neck pain.  Skin: Negative for itching and rash.  Neurological: Negative for dizziness, tremors, seizures and loss of consciousness.     Past Medical History  Diagnosis Date  . Varicose veins   . Hypothyroidism   . Vitamin D deficiency   . Hyperlipidemia   . Hypertension   . Shoulder pain, right 10/2009    X- rayed at Dr. Berenice Primas.Severe Degenerative Disease. Recommended an artificial shoulder. Saw Dr. Tamera Punt .  Marland Kitchen Right shoulder injury 12/2009  . Leg pain   . Hyperglycemia   . Macular degeneration   . Keratosis seborrheica   . Left knee pain   . Osteoporosis   . Neoplasm of breast     right breast cancer  . Fall 10-09-2011    fell outdoors on driveway fell and hit right side of body and face  . Pain in joint, pelvic region and thigh 05/11/2012  . Pain in joint, site unspecified 02/03/39  . Phlebitis and thrombophlebitis of other deep vessels of lower extremities 01/06/2012  . Other malaise and fatigue 01/06/2012  . Closed fracture of two ribs 10/21/2011, 08/24/2013  . Other specified circulatory system disorders 10/09/2011  . Open wound of knee, leg (except thigh), and ankle, without mention of complication 3/00/9233  . Closed fracture of unspecified part of upper end of humerus 01/13/2005  . Female stress incontinence 02/10/2004  .  Herpes zoster without mention of complication 94/49/6759  . Malignant neoplasm of breast (female), unspecified site   . Varicose vein 2014  . Multiple fractures of ribs of left side 08/24/2013  . Gait disorder 08/25/2013    Poor balance    Past Surgical History  Procedure Laterality Date  . Left cataract  1999  . Right mastectomy  1989  . Vesicovaginal fistula closure w/ tah  1967  . Colon polyps  1989, 1992, 1995, 1997  . Cataract extraction  04/2004    Dr. Bing Plume  . Mole removal  03/04/2009    on back by Dr.  Derrel Nip  . Endovenous ablation saphenous vein w/ laser  01-30-2012    left greater saphenous vein and sclerotherapy left leg  . Abdominal hysterectomy  1967   Social History:   reports that she quit smoking about 36 years ago. Her smoking use included Cigarettes. She has never used smokeless tobacco. She reports that she drinks alcohol. She reports that she does not use illicit drugs.  Family History  Problem Relation Age of Onset  . Cancer Mother     Medications: Patient's Medications  New Prescriptions   No medications on file  Previous Medications   ACETAMINOPHEN (TYLENOL) 500 MG TABLET    Take 1 tablet (500 mg total) by mouth every 6 (six) hours as needed.   ASPIRIN 325 MG EC TABLET    Take 325 mg by mouth once a week. On Tuesdays    CALCIUM CARB-CHOLECALCIFEROL (CALCIUM 500 +D) 500-400 MG-UNIT TABS    Take 1 tablet by mouth daily.    CHOLECALCIFEROL (VITAMIN D) 1000 UNITS TABLET    Take 2,000 Units by mouth daily.   FISH OIL-OMEGA-3 FATTY ACIDS 1000 MG CAPSULE    Take 1 g by mouth daily.   FUROSEMIDE (LASIX) 40 MG TABLET    Take one tablet in morning for fluid   LOSARTAN (COZAAR) 50 MG TABLET    Take 1 tablet (50 mg total) by mouth daily.   MULTIPLE VITAMINS-MINERALS (ICAPS) CAPS    Take 1 capsule by mouth every other day. Take one daily alt with Protegra   POTASSIUM CHLORIDE SA (K-DUR,KLOR-CON) 20 MEQ TABLET    Take one tablet in morning for potassium  Modified Medications   No medications on file  Discontinued Medications   No medications on file     Physical Exam: Filed Vitals:   07/28/14 1625  BP: 114/70  Pulse: 88  Temp: 96.8 F (36 C)  Resp: 18  Weight: 122 lb 12.8 oz (55.702 kg)  SpO2: 95%  Physical Exam  Constitutional: She is oriented to person, place, and time. No distress.  HENT:  Head: Normocephalic and atraumatic.  Neck: No JVD present. No tracheal deviation present.  Cardiovascular: Normal rate, regular rhythm and normal heart sounds.     Bilateral lower ext edema +2, with fluid filled blisters and dry flaky skin  Pulmonary/Chest: Effort normal and breath sounds normal. No respiratory distress. She has no wheezes.  Abdominal: Soft. Bowel sounds are normal. She exhibits no distension.  Lymphadenopathy:    She has no cervical adenopathy.  Neurological: She is alert and oriented to person, place, and time.  Skin: Skin is warm and dry. She is not diaphoretic.     Labs reviewed: Basic Metabolic Panel:  Recent Labs  01/27/14 07/28/14  NA 123* 132*  K 4.3 3.8  BUN 10 20  CREATININE 0.4* 0.8   Liver Function Tests:  Recent Labs  01/27/14  AST 25  ALT 17  ALKPHOS 50  07/21/14: Albumin 3.1  Imaging and Procedures: Echo 07/14/14 reviewed EKG 06/22/14 with HR 106, sinus tachy, PACs, anteroseptal MI of undetermined age  Assessment/Plan  1. Acute on chronic combined systolic and diastolic heart failure Continue Coreg and current dose of Lasix. Her weight has increased but she also reports that she is eating more and that her appetite has significantly improved. She does not have any other signs worrisome for CHF. She is to follow up with cardiology on 08/09/14.  Continue to monitor weights at this time and if they continue to trend upward will increase lasix  2. Essential hypertension Controlled. Continue to hold losartan, which caused worsening kidney function. BP/HR controlled at present with BB and lasix.   3. Hyponatremia/Hyperkalemia Improved, recheck in 2 weeks  4. Insomnia Melatonin 5mg  p.o.qhs prn sleep  5. Hypoalbuminemia Albumin 3.1 with overall decreased appetite but improving. Will add boost prn, dietary to follow  Cindi Carbon, [place] [phone]

## 2014-07-30 ENCOUNTER — Encounter: Payer: Self-pay | Admitting: Internal Medicine

## 2014-08-01 ENCOUNTER — Non-Acute Institutional Stay (SKILLED_NURSING_FACILITY): Payer: Medicare Other | Admitting: Adult Health

## 2014-08-01 DIAGNOSIS — M545 Low back pain, unspecified: Secondary | ICD-10-CM

## 2014-08-01 DIAGNOSIS — I5043 Acute on chronic combined systolic (congestive) and diastolic (congestive) heart failure: Secondary | ICD-10-CM

## 2014-08-01 NOTE — Progress Notes (Signed)
Patient ID: Tara Garrett, female   DOB: 06-19-1922, 81 y.o.   MRN: [id number]   Location:  [facility]  Code Status: DNR; has living will and hcpoa  Allergies  Allergen Reactions  . Mobic [Meloxicam]     Blood pressure goes up  . Tramadol     Blood pressure goes up     Chief Complaint  Patient presents with  . Acute Visit    weight gain    HPI: 81 y.o. female with h/o htn, chronic venous stasis dermatitis, hyperglycemia, hypothyroidism, senile osteoporosis, vitamin D deficiency, stress incontinence, prior right-sided breast cancer, prior rib fractures and chronic low back pain was admitted to rehab due to inability to care for herself in her home environment due to weakness in the context of acute systolic and diastolic chf. Echo (09/14/94) showed severe systolic dysfunction with EF 15-20% with diffuse hypokinesis and grade 2 diastolic dysfunction.   Update: I was asked to see her today due to a 7 lb weight gain in 1 week. She reports DOE that has not changed. She has not noticed a difference in her leg swelling, which is chronic. She denies CP. She does report an increased appetite since her arrival, but not enough to account for this much gain. The weights have steadily trended upward since her arrival.   In addition, on 07/26/14 losartan was held due a Cr rise to 1.3 on 07/26/14 from 0.4 on 01/27/14.  As of 08/01/2014 her Cr has decreased to 0.78. Losartan remains on hold.  She was placed on Coreg as well on 07/26/14 for an elevated HR. Pulse is 80 today.  She also reports low back pain that is getting worse without radiculopathy. She denies change in bowel/bladder habits. She reports that heat and Tylenol seem to help. She reports a hx of OP.  Review of Systems  Constitutional: Negative for fever, chills, weight loss, malaise/fatigue and diaphoresis.  Respiratory: Negative for cough, sputum production and shortness of breath.        DOE  Cardiovascular: Positive for leg  swelling. Negative for chest pain, palpitations, orthopnea, claudication and PND.  Gastrointestinal: Negative for heartburn, nausea, vomiting, abdominal pain and constipation.  Genitourinary: Negative for dysuria.  Musculoskeletal: Positive for back pain.  Skin: Negative for itching and rash.  Neurological: Negative for dizziness, tingling, tremors and weakness.     Past Medical History  Diagnosis Date  . Varicose veins   . Hypothyroidism   . Vitamin D deficiency   . Hyperlipidemia   . Hypertension   . Shoulder pain, right 10/2009    X- rayed at Dr. Berenice Primas.Severe Degenerative Disease. Recommended an artificial shoulder. Saw Dr. Tamera Punt .  Marland Kitchen Right shoulder injury 12/2009  . Leg pain   . Hyperglycemia   . Macular degeneration   . Keratosis seborrheica   . Left knee pain   . Osteoporosis   . Neoplasm of breast     right breast cancer  . Fall 10-09-2011    fell outdoors on driveway fell and hit right side of body and face  . Pain in joint, pelvic region and thigh 05/11/2012  . Pain in joint, site unspecified 02/03/39  . Phlebitis and thrombophlebitis of other deep vessels of lower extremities 01/06/2012  . Other malaise and fatigue 01/06/2012  . Closed fracture of two ribs 10/21/2011, 08/24/2013  . Other specified circulatory system disorders 10/09/2011  . Open wound of knee, leg (except thigh), and ankle, without mention of complication 2/95/2841  .  Closed fracture of unspecified part of upper end of humerus 01/13/2005  . Female stress incontinence 02/10/2004  . Herpes zoster without mention of complication 33/54/5625  . Malignant neoplasm of breast (female), unspecified site   . Varicose vein 2014  . Multiple fractures of ribs of left side 08/24/2013  . Gait disorder 08/25/2013    Poor balance    Past Surgical History  Procedure Laterality Date  . Left cataract  1999  . Right mastectomy  1989  . Vesicovaginal fistula closure w/ tah  1967  . Colon polyps  1989, 1992, 1995, 1997  .  Cataract extraction  04/2004    Dr. Bing Plume  . Mole removal  03/04/2009    on back by Dr. Derrel Nip  . Endovenous ablation saphenous vein w/ laser  01-30-2012    left greater saphenous vein and sclerotherapy left leg  . Abdominal hysterectomy  1967   Social History:   reports that she quit smoking about 36 years ago. Her smoking use included Cigarettes. She has never used smokeless tobacco. She reports that she drinks alcohol. She reports that she does not use illicit drugs.  Family History  Problem Relation Age of Onset  . Cancer Mother     Medications: Patient's Medications  New Prescriptions   No medications on file  Previous Medications   ACETAMINOPHEN (TYLENOL) 500 MG TABLET    Take 1 tablet (500 mg total) by mouth every 6 (six) hours as needed.   ASPIRIN 325 MG EC TABLET    Take 325 mg by mouth once a week. On Tuesdays    CALCIUM CARB-CHOLECALCIFEROL (CALCIUM 500 +D) 500-400 MG-UNIT TABS    Take 1 tablet by mouth daily.    CHOLECALCIFEROL (VITAMIN D) 1000 UNITS TABLET    Take 2,000 Units by mouth daily.   FISH OIL-OMEGA-3 FATTY ACIDS 1000 MG CAPSULE    Take 1 g by mouth daily.   FUROSEMIDE (LASIX) 40 MG TABLET    Take one tablet in morning for fluid   LOSARTAN (COZAAR) 50 MG TABLET    Take 1 tablet (50 mg total) by mouth daily.   MULTIPLE VITAMINS-MINERALS (ICAPS) CAPS    Take 1 capsule by mouth every other day. Take one daily alt with Protegra   POTASSIUM CHLORIDE SA (K-DUR,KLOR-CON) 20 MEQ TABLET    Take one tablet in morning for potassium  Modified Medications   No medications on file  Discontinued Medications   No medications on file     Physical Exam: Filed Vitals:   08/01/14 1419  BP: 124/85  Pulse: 80  Temp: 96.8 F (36 C)  Resp: 22  Weight: 125 lb 6.4 oz (56.881 kg)  SpO2: 92%  Physical Exam  Constitutional: She is oriented to person, place, and time. No distress.  HENT:  Head: Normocephalic and atraumatic.  Neck: No JVD present. No tracheal deviation  present.  Cardiovascular: Normal rate, regular rhythm and normal heart sounds.   Bilateral lower ext edema +2, with fluid filled blisters and dry flaky skin. R leg swelling slightly worse than right. No pain, tenderness, erythema or warmth  Pulmonary/Chest: Effort normal and breath sounds normal. No respiratory distress. She has no wheezes.  Abdominal: Soft. Bowel sounds are normal. She exhibits no distension.  Lymphadenopathy:    She has no cervical adenopathy.  Neurological: She is alert and oriented to person, place, and time.  Skin: Skin is warm and dry. She is not diaphoretic.     Labs reviewed: Basic Metabolic Panel:  Recent Labs  07/21/14 07/26/14 07/28/14  NA 131* 135* 132*  K 3.0* 3.7 3.8  BUN 19 21 20   CREATININE 0.9 1.3* 0.8   Liver Function Tests:  Recent Labs  01/27/14  AST 25  ALT 17  ALKPHOS 50  07/21/14: Albumin 3.1  Imaging and Procedures: Echo 07/14/14 reviewed EKG 06/22/14 with HR 106, sinus tachy, PACs, anteroseptal MI of undetermined age  Assessment/Plan  1. Acute on chronic combined systolic and diastolic heart failure Increase lasix to 80 mg daily for two days. Continue Kdur, check BMP on 08/04/14.    2. Midline low back pain without sciatica Check xray for compression fx, hx of OP. Continue heating pad. Continue Tylenol for pain.  Cindi Carbon, ANP [facility] [phone]

## 2014-08-03 ENCOUNTER — Non-Acute Institutional Stay (SKILLED_NURSING_FACILITY): Payer: Medicare Other | Admitting: Internal Medicine

## 2014-08-03 DIAGNOSIS — I5043 Acute on chronic combined systolic (congestive) and diastolic (congestive) heart failure: Secondary | ICD-10-CM

## 2014-08-03 DIAGNOSIS — I872 Venous insufficiency (chronic) (peripheral): Secondary | ICD-10-CM

## 2014-08-03 NOTE — Progress Notes (Signed)
Patient ID: Tara Garrett, female   DOB: 1923-04-01, 81 y.o.   MRN: [id number]  Location:  [facility] Provider:  Rexene Edison. Mariea Clonts, D.O., C.M.D.  Code Status:  DNR Advanced Directive information Does patient have an advance directive?: Yes, Type of Advance Directive: [place];Living will;Out of facility DNR (pink MOST or yellow form), Pre-existing out of facility DNR order (yellow form or pink MOST form): Yellow form placed in chart (order not valid for inpatient use), Does patient want to make changes to advanced directive?: No - Patient declined  Chief Complaint  Patient presents with  . Acute Visit    chf treatment    HPI:  81yo white female here for short term rehab due to new onset chf, severe edema with leaking ulcers, recent weakness and inability to care for herself in [place] was seen to f/u on her weights since lasix was doubled yesterday.  Weight has dropped from 127.4 to 125.6 lbs after it had been climbing (pt also has been eating quite well now).  She now denies dyspnea, but is not the best historian at times.  She's had some improvement in the edema of her legs.  She still has an open ulcer on the left and a large one that is ready to rupture on her right leg.  They remain tender, but are not warm.    Review of Systems:  Review of Systems  Constitutional: Positive for weight loss and malaise/fatigue. Negative for fever and chills.  HENT: Negative for congestion.   Eyes: Negative for blurred vision.  Respiratory: Negative for cough and shortness of breath.   Cardiovascular: Positive for leg swelling. Negative for chest pain and palpitations.       Improved also but still has ulcers  Gastrointestinal: Negative for constipation.       Appetite improved  Genitourinary: Negative for dysuria.  Musculoskeletal: Negative for falls.  Neurological: Positive for weakness. Negative for dizziness.  Psychiatric/Behavioral: Positive for memory loss.     Medications: Patient's Medications  New Prescriptions   No medications on file  Previous Medications   ACETAMINOPHEN (TYLENOL) 500 MG TABLET    Take 1 tablet (500 mg total) by mouth every 6 (six) hours as needed.   ASPIRIN 325 MG EC TABLET    Take 325 mg by mouth once a week. On Tuesdays    CALCIUM CARB-CHOLECALCIFEROL (CALCIUM 500 +D) 500-400 MG-UNIT TABS    Take 1 tablet by mouth daily.    CHOLECALCIFEROL (VITAMIN D) 1000 UNITS TABLET    Take 2,000 Units by mouth daily.   FISH OIL-OMEGA-3 FATTY ACIDS 1000 MG CAPSULE    Take 1 g by mouth daily.   FUROSEMIDE (LASIX) 40 MG TABLET    Take one tablet in morning for fluid   LOSARTAN (COZAAR) 50 MG TABLET    Take 1 tablet (50 mg total) by mouth daily.   MULTIPLE VITAMINS-MINERALS (ICAPS) CAPS    Take 1 capsule by mouth every other day. Take one daily alt with Protegra   POTASSIUM CHLORIDE SA (K-DUR,KLOR-CON) 20 MEQ TABLET    Take one tablet in morning for potassium  Modified Medications   No medications on file  Discontinued Medications   No medications on file    Physical Exam: Filed Vitals:   08/03/14 1105  BP: 121/79  Pulse: 74  Temp: 96.4 F (35.8 C)  Resp: 18  Height: 4\' 9"  (1.448 m)  Weight: 125 lb 9.6 oz (56.972 kg)  SpO2: 96%  Physical Exam  Constitutional: She appears well-developed and well-nourished. No distress.  White female, was taking a nap when I came in to see her  Neck: No JVD present.  Cardiovascular: Normal rate, regular rhythm, normal heart sounds and intact distal pulses.   Pulmonary/Chest: Effort normal and breath sounds normal. She has no rales.  Abdominal: Soft. Bowel sounds are normal. She exhibits no distension and no mass. There is no tenderness.  Skin: Skin is warm and dry. There is erythema.  Bilateral lower legs with erythema, dry scaly skin, some wrinkling of skin now as edema decreased; left leg with open blister/ulcer present with clear drainage (minor) on dressing; right leg with large  blister/ulcer still unruptured; mild tenderness of lower legs; has long thick toenails   Labs reviewed: Basic Metabolic Panel:  Recent Labs  07/21/14 07/26/14 07/28/14  NA 131* 135* 132*  K 3.0* 3.7 3.8  BUN 19 21 20   CREATININE 0.9 1.3* 0.8    Liver Function Tests:  Recent Labs  01/27/14  AST 25  ALT 17  ALKPHOS 50    CBC:  Recent Labs  07/26/14  WBC 7.7  HGB 13.2  HCT 40  PLT 205    Assessment/Plan 1. Acute on chronic combined systolic and diastolic heart failure -weight loss of 1.8 lbs with lasix 80mg  yesterday and will get this again today, then back to 40mg  -cont daily weights, NAS diet, elevation of legs, extra potassium -losartan remains on hold due to acute on chronic renal failure noted at admission -I added coreg last time I saw her due to elevated pulse and chf findings from echo -?entresto candidate -keep appt with cardiology next week -I will reassess her Tuesday--weights, breathing, edema -cont nonadherent dressings to blistered areas on legs  2. Venous insufficiency (chronic) (peripheral) -cont elevating feet - when blisters resolve, resume compression hose in daytime -cont nas diet -needs podiatry to trim toenails  Family/ staff Communication: discussed with her nurse At least 25 minutes were spent including time spent with resident and coordinating care with nursing staff  Goals of care: here at this time for short term rehab with goal to return to her independent living [place], but not receiving true rehab (not in network for her insurance, but getting walks and some restorative treatments)  Labs/tests ordered:  Has bmp tomorrow

## 2014-08-04 ENCOUNTER — Encounter: Payer: Self-pay | Admitting: Cardiology

## 2014-08-06 ENCOUNTER — Encounter: Payer: Self-pay | Admitting: Internal Medicine

## 2014-08-06 MED ORDER — CARVEDILOL 3.125 MG PO TABS: 3.1250 mg | ORAL_TABLET | Freq: Two times a day (BID) | ORAL | Status: AC

## 2014-08-08 ENCOUNTER — Telehealth: Payer: Self-pay | Admitting: Cardiology

## 2014-08-08 NOTE — Telephone Encounter (Signed)
Spoke with [place] and she just wanted to make sure  Dr. Mare Ferrari aware that patient has had an increased 10 pounds since first part of the month. Patient has appointment tomorrow

## 2014-08-08 NOTE — Telephone Encounter (Signed)
[place], is a Rn from [employer], called to speak w/ Rn- she knows that pt's medical records have been faxed already, but wanted to include her recent weight gain and other problems with her condition before NP appt on 3/1. Please call back and discuss.

## 2014-08-09 ENCOUNTER — Encounter: Payer: Self-pay | Admitting: Cardiology

## 2014-08-09 ENCOUNTER — Ambulatory Visit (INDEPENDENT_AMBULATORY_CARE_PROVIDER_SITE_OTHER): Payer: Medicare Other | Admitting: Cardiology

## 2014-08-09 VITALS — BP 102/66 | HR 81 | Ht <= 58 in | Wt 133.0 lb

## 2014-08-09 DIAGNOSIS — R600 Localized edema: Secondary | ICD-10-CM

## 2014-08-09 DIAGNOSIS — I5043 Acute on chronic combined systolic (congestive) and diastolic (congestive) heart failure: Secondary | ICD-10-CM

## 2014-08-09 DIAGNOSIS — I10 Essential (primary) hypertension: Secondary | ICD-10-CM

## 2014-08-09 DIAGNOSIS — R609 Edema, unspecified: Secondary | ICD-10-CM

## 2014-08-09 LAB — BASIC METABOLIC PANEL
BUN: 24 mg/dL — ABNORMAL HIGH (ref 6–23)
CALCIUM: 9.2 mg/dL (ref 8.4–10.5)
CO2: 31 meq/L (ref 19–32)
CREATININE: 0.96 mg/dL (ref 0.40–1.20)
Chloride: 102 mEq/L (ref 96–112)
GFR: 58.04 mL/min — AB (ref >60.00)
Glucose, Bld: 107 mg/dL — ABNORMAL HIGH (ref 70–99)
Potassium: 3.6 mEq/L (ref 3.5–5.1)
Sodium: 138 mEq/L (ref 135–145)

## 2014-08-09 NOTE — Progress Notes (Signed)
Quick Note:  Please report to patient. The recent labs are stable. Continue medication as discussed office visit today. ______

## 2014-08-09 NOTE — Progress Notes (Signed)
Tara Garrett Date of Birth:  1922-10-30 [place] [address] [place], [place]  [address] [phone]        Fax   [phone]   History of Present Illness: This pleasant 81 year old resident of [facility] is seen by me for the first time today.  She is being seen at the request of Hollace Kinnier DO for worsening congestive heart failure.  She had an echocardiogram on 07/14/14 which showed the following: - Left ventricle: The cavity size was mildly dilated. Wall thickness was normal. The estimated ejection fraction was in the range of 15% to 20%. Severe diffuse hypokinesis. Features are consistent with a pseudonormal left ventricular filling pattern, with concomitant abnormal relaxation and increased filling pressure (grade 2 diastolic dysfunction). Cannot exclude apical thrombus. - Mitral valve: Calcified annulus. Mildly thickened leaflets . There was mild to moderate regurgitation directed centrally. - Left atrium: The atrium was moderately to severely dilated. - Right ventricle: Systolic function was moderately reduced. - Pulmonary arteries: Systolic pressure was moderately increased. PA peak pressure: 61 mm Hg (S). - Pericardium, extracardiac: A trivial pericardial effusion was identified. Her most recent blood work on 07/28/14 showed creatinine of 0.8 and a BUN of 20 and a blood sugar of 94.  Potassium is 3.8 The patient does not have any past history of documented ischemic heart disease.  In March 2015 patient had an episode at her [place] [facility] [employer] in which she apparently fell or passed out and remained on her floor all night until someone discovered her the next day.  She was taken to the emergency room and was checked for rib fractures.  There was no indication at that time of cardiomegaly or any cardiac issues.  She spent several weeks in the nursing facility [facility] and then she was able to return to her [place]. She was in her  usual state of health until approximately 3 weeks ago when she began having increased peripheral edema.  Her weight has gone up about 10 pounds over the past several weeks.  She apparently had a portable chest x-ray done at [facility] which showed cardiomegaly.  We do not have a copy of that report.  That led to an echocardiogram which is noted above in which showed severe left ventricular systolic and diastolic dysfunction with ejection fraction of 15-20%.  There was evidence of elevated pulmonary artery pressure.  She has been treated with Lasix.  She was on losartan which was held starting on February 16 after her serum creatinine went to 1.3.  Her creatinine has subsequently returned to normal.  She was begun on carvedilol 3.125 mg twice a day on February 16.  She has been on furosemide 40 mg daily. Her lower extremities have shown severe dependent edema with blistering.  They are being treated with Ace wraps by the nursing staff. The patient denies any chest pain or angina.  Her echocardiogram showed diffuse hypokinesis without regional wall motion abnormalities, suggesting a nonischemic cardiomyopathy. Current Outpatient Prescriptions  Medication Sig Dispense Refill  . acetaminophen (TYLENOL) 500 MG tablet Take 1 tablet (500 mg total) by mouth every 6 (six) hours as needed. 30 tablet 0  . aspirin 325 MG EC tablet Take 325 mg by mouth once a week. On Tuesdays     . Calcium Carb-Cholecalciferol (CALCIUM 500 +D) 500-400 MG-UNIT TABS Take 1 tablet by mouth daily.     . carvedilol (COREG) 3.125 MG tablet Take 1 tablet (3.125 mg total) by mouth  2 (two) times daily with a meal. 60 tablet 3  . cholecalciferol (VITAMIN D) 1000 UNITS tablet Take 2,000 Units by mouth daily.    . fish oil-omega-3 fatty acids 1000 MG capsule Take 1 g by mouth daily.    . furosemide (LASIX) 80 MG tablet Take 80 mg by mouth daily.    Marland Kitchen losartan (COZAAR) 25 MG tablet Take 25 mg by mouth daily.    . Multiple Vitamins-Minerals  (ICAPS) CAPS Take 1 capsule by mouth every other day. Take one daily alt with Protegra    . potassium chloride SA (K-DUR,KLOR-CON) 20 MEQ tablet Take one tablet in morning for potassium 30 tablet 3   No current facility-administered medications for this visit.    Allergies  Allergen Reactions  . Mobic [Meloxicam]     Blood pressure goes up  . Tramadol     Blood pressure goes up     Patient Active Problem List   Diagnosis Date Noted  . Acute on chronic combined systolic and diastolic heart failure 03/47/4259  . Low back pain 07/26/2014  . Gait disorder 08/25/2013  . Closed fracture of two ribs   . Osteoarthritis, hip, bilateral 12/21/2012  . Hypothyroidism   . Hypertension   . Varicose veins of lower extremities with other complications 56/38/7564  . Other malaise and fatigue 01/06/2012  . Venous stasis dermatitis 10/14/2011  . ADENOCARCINOMA, BREAST 10/31/2007  . COLONIC POLYPS 10/31/2007  . DIVERTICULOSIS, COLON 10/31/2007  . Senile osteoporosis 10/31/2007  . Memory loss 10/31/2007    History  Smoking status  . Former Smoker  . Types: Cigarettes  . Quit date: 06/10/1978  Smokeless tobacco  . Never Used    History  Alcohol Use  . 0.0 oz/week  . 0-1 drink(s) per week    Family History  Problem Relation Age of Onset  . Cancer Mother     Review of Systems: Constitutional: no fever chills diaphoresis or fatigue or change in weight.  Head and neck: no hearing loss, no epistaxis, no photophobia or visual disturbance. Respiratory: No cough, shortness of breath or wheezing. Cardiovascular: Positive for dyspnea and severe peripheral edema. Gastrointestinal: No abdominal distention, no abdominal pain, no change in bowel habits hematochezia or melena. Genitourinary: No dysuria, no frequency, no urgency, no nocturia. Musculoskeletal:No arthralgias, no back pain, no gait disturbance or myalgias. Neurological: No dizziness, no headaches, no numbness, no seizures, no  syncope, no weakness, no tremors. Hematologic: No lymphadenopathy, no easy bruising. Psychiatric: No confusion, no hallucinations, no sleep disturbance.   Wt Readings from Last 3 Encounters:  08/09/14 133 lb (60.328 kg)  08/03/14 125 lb 9.6 oz (56.972 kg)  08/01/14 125 lb 6.4 oz (56.881 kg)    Physical Exam: Filed Vitals:   08/09/14 1111  BP: 102/66  Pulse: 81  The patient appears to be in no distress.  She arrived by wheelchair with her power of attorney and her contingent power of attorney accompanied her  Head and neck exam reveals that the pupils are equal and reactive.  The extraocular movements are full.  There is no scleral icterus.  Mouth and pharynx are benign.  No lymphadenopathy.  No carotid bruits.  The jugular venous pressure is elevated.  Thyroid is not enlarged or tender.  Chest reveals diminished breath sounds at right base.  No wheezing is appreciated  Heart reveals no abnormal lift or heave.  First and second heart sounds are normal.  There is no murmur  rub or click.  There is  an S3 gallop  The abdomen is soft and nontender.  Bowel sounds are normoactive.  There is no hepatosplenomegaly or mass.  There are no abdominal bruits.  Extremities reveal significant bilateral edema present.  Both legs are wrapped.  They were not unwrapped to examine.  Neurologic exam is normal strength and no lateralizing weakness.  No sensory deficits.  Integument reveals no rash.  Legs are not examined.  She has a history of a stasis dermatitis rash on her legs  EKG shows normal sinus rhythm with left axis deviation and nonspecific ST-T wave changes.  Possible old anteroseptal myocardial infarction.  Assessment / Plan: 1.  Acute on chronic combined systolic and diastolic left ventricular dysfunction. 2.  Severe peripheral edema secondary to #1  Recommendations: We will obtain another chest x-ray which for logistical reasons we will do as a portable at [facility].  The family did not  think that it was feasible for her to go to one of the outpatient facilities here in town.  We will get a baseline BMET today and increase her furosemide to 80 mg daily.  We'll restart her losartan and a lower dose of 25 mg daily.  We cannot pressure medication and he harder at this point because of relatively low blood pressure today.  Her strategy will be to gradually ramp up the dose of her carvedilol and her losartan to help with severe left ventricular systolic dysfunction.  She is to continue to try to keep her legs elevated to help with dependent edema.  I've asked her to let me check her again in several weeks for a follow-up office visit and basal metabolic panel.  We will need to keep a close watch on her renal function. Many thanks for the opportunity to see this pleasant woman with you.

## 2014-08-09 NOTE — Patient Instructions (Addendum)
Will obtain labs today and call you with the results (bmet)  INCREASE YOUR LASIX TO 80 MG DAILY  RESTART LOSARTAN AT 25 MG DAILY   Your physician recommends that you schedule a follow-up appointment in: 3 WEEKS ON 08/31/14 AT 11:30

## 2014-08-10 DIAGNOSIS — J810 Acute pulmonary edema: Secondary | ICD-10-CM | POA: Diagnosis not present

## 2014-08-11 ENCOUNTER — Telehealth: Payer: Self-pay | Admitting: *Deleted

## 2014-08-11 NOTE — Telephone Encounter (Signed)
-----   Message from [name], MD sent at 08/09/2014  5:21 PM EST ----- Please report to patient.  The recent labs are stable. Continue medication as discussed office visit today.

## 2014-08-11 NOTE — Telephone Encounter (Signed)
Left message to call back   Dr. Mare Ferrari reviewed Chest xray as well. Xray showed CHF, continue medications as discussed at [facility]

## 2014-08-24 ENCOUNTER — Non-Acute Institutional Stay (SKILLED_NURSING_FACILITY): Payer: Medicare Other | Admitting: Nurse Practitioner

## 2014-08-24 DIAGNOSIS — S81801A Unspecified open wound, right lower leg, initial encounter: Secondary | ICD-10-CM | POA: Diagnosis not present

## 2014-08-24 DIAGNOSIS — R609 Edema, unspecified: Secondary | ICD-10-CM | POA: Diagnosis not present

## 2014-08-24 NOTE — Progress Notes (Signed)
Patient ID: Tara Garrett, female   DOB: 1922/07/10, 81 y.o.   MRN: [id number]    Nursing Home Location:  [place] of Service: SNF (31)  PCP: REED, TIFFANY, DO  Allergies  Allergen Reactions  . Mobic [Meloxicam]     Blood pressure goes up  . Tramadol     Blood pressure goes up     Chief Complaint  Patient presents with  . Acute Visit    evaluation of leg wounds     HPI:  Patient is a 81 y.o. female seen today at [facility] rehab section at the request of nursing to evaluate if leg wound looks infected. Pt with a  h/o htn, chronic venous stasis dermatitis, hyperglycemia, hypothyroidism, senile osteoporosis, vitamin D deficiency, stress incontinence, prior right-sided breast cancer, prior rib fractures and chronic low back pain was admitted to rehab due to inability to care for herself in her home environment due to weakness in the context of acute systolic and diastolic chf. Echo (08/14/60) showed severe systolic dysfunction with EF 15-20% with diffuse hypokinesis and grade 2 diastolic dysfunction. pt recently seen by cardiology who increased her lasix to 80 mg daily. Pt remains with LE edema. Pt with poor PO intake prior to being in rehab, eating better. Pt with blisters on bilateral legs and recently blister to right medial LE opened and drained. Area with serous drainage but apparently someone was concerned over infection. Treatment nurse following wound and dressing with foam and Kerlix.   Review of Systems:  Review of Systems  Constitutional: Negative for fever, chills, activity change and fatigue.  HENT: Negative for congestion.   Respiratory: Negative for cough and shortness of breath.   Cardiovascular: Positive for leg swelling. Negative for chest pain and palpitations.       Fluid and blood filled blisters noted to LE where there is increased edema  Gastrointestinal: Negative for constipation.       Appetite improved    Neurological: Positive for weakness. Negative for dizziness.    Past Medical History  Diagnosis Date  . Varicose veins   . Hypothyroidism   . Vitamin D deficiency   . Hyperlipidemia   . Hypertension   . Shoulder pain, right 10/2009    X- rayed at Dr. Berenice Primas.Severe Degenerative Disease. Recommended an artificial shoulder. Saw Dr. Tamera Punt .  Marland Kitchen Right shoulder injury 12/2009  . Leg pain   . Hyperglycemia   . Macular degeneration   . Keratosis seborrheica   . Left knee pain   . Osteoporosis   . Neoplasm of breast     right breast cancer  . Fall 10-09-2011    fell outdoors on driveway fell and hit right side of body and face  . Pain in joint, pelvic region and thigh 05/11/2012  . Pain in joint, site unspecified 02/03/39  . Phlebitis and thrombophlebitis of other deep vessels of lower extremities 01/06/2012  . Other malaise and fatigue 01/06/2012  . Closed fracture of two ribs 10/21/2011, 08/24/2013  . Other specified circulatory system disorders 10/09/2011  . Open wound of knee, leg (except thigh), and ankle, without mention of complication 02/08/5175  . Closed fracture of unspecified part of upper end of humerus 01/13/2005  . Female stress incontinence 02/10/2004  . Herpes zoster without mention of complication 16/12/3708  . Malignant neoplasm of breast (female), unspecified site   . Varicose vein 2014  . Multiple fractures of ribs of left side 08/24/2013  . Gait  disorder 08/25/2013    Poor balance    Past Surgical History  Procedure Laterality Date  . Left cataract  1999  . Right mastectomy  1989  . Vesicovaginal fistula closure w/ tah  1967  . Colon polyps  1989, 1992, 1995, 1997  . Cataract extraction  04/2004    Dr. Bing Plume  . Mole removal  03/04/2009    on back by Dr. Derrel Nip  . Endovenous ablation saphenous vein w/ laser  01-30-2012    left greater saphenous vein and sclerotherapy left leg  . Abdominal hysterectomy  1967   Social History:   reports that she quit smoking  about 36 years ago. Her smoking use included Cigarettes. She has never used smokeless tobacco. She reports that she drinks alcohol. She reports that she does not use illicit drugs.  Family History  Problem Relation Age of Onset  . Cancer Mother     Medications: Patient's Medications  New Prescriptions   No medications on file  Previous Medications   ACETAMINOPHEN (TYLENOL) 500 MG TABLET    Take 1 tablet (500 mg total) by mouth every 6 (six) hours as needed.   ASPIRIN 325 MG EC TABLET    Take 325 mg by mouth once a week. On Tuesdays    CALCIUM CARB-CHOLECALCIFEROL (CALCIUM 500 +D) 500-400 MG-UNIT TABS    Take 1 tablet by mouth daily.    CARVEDILOL (COREG) 3.125 MG TABLET    Take 1 tablet (3.125 mg total) by mouth 2 (two) times daily with a meal.   CHOLECALCIFEROL (VITAMIN D) 1000 UNITS TABLET    Take 2,000 Units by mouth daily.   FISH OIL-OMEGA-3 FATTY ACIDS 1000 MG CAPSULE    Take 1 g by mouth daily.   FUROSEMIDE (LASIX) 80 MG TABLET    Take 80 mg by mouth daily.   LOSARTAN (COZAAR) 25 MG TABLET    Take 25 mg by mouth daily.   MULTIPLE VITAMINS-MINERALS (ICAPS) CAPS    Take 1 capsule by mouth every other day. Take one daily alt with Protegra   POTASSIUM CHLORIDE SA (K-DUR,KLOR-CON) 20 MEQ TABLET    Take one tablet in morning for potassium  Modified Medications   No medications on file  Discontinued Medications   No medications on file     Physical Exam: There were no vitals filed for this visit.  Physical Exam  Constitutional: She appears well-developed and well-nourished. No distress.  Neck: Normal range of motion. Neck supple. No JVD present.  Cardiovascular: Normal rate, regular rhythm, normal heart sounds and intact distal pulses.   Pulmonary/Chest: Effort normal and breath sounds normal.  Abdominal: Soft. Bowel sounds are normal. She exhibits no distension. There is no tenderness.  Skin: Skin is warm and dry. There is erythema.  Bilateral lower legs with erythema, right  leg with large blister/ulcer that is open with 2 areas, small amount of clear drainage to dressing, no odor, increased erythema or purulent drainage to ulcer    Labs reviewed: Basic Metabolic Panel:  Recent Labs  07/26/14 07/28/14 08/09/14 1245  NA 135* 132* 138  K 3.7 3.8 3.6  CL  --   --  102  CO2  --   --  31  GLUCOSE  --   --  107*  BUN 21 20 24*  CREATININE 1.3* 0.8 0.96  CALCIUM  --   --  9.2   Liver Function Tests:  Recent Labs  01/27/14  AST 25  ALT 17  ALKPHOS 50  No results for input(s): LIPASE, AMYLASE in the last 8760 hours. No results for input(s): AMMONIA in the last 8760 hours. CBC:  Recent Labs  07/26/14  WBC 7.7  HGB 13.2  HCT 40  PLT 205   TSH:  Recent Labs  01/27/14  TSH 3.76   A1C: Lab Results  Component Value Date   HGBA1C 5.1 01/27/2014   Lipid Panel:  Recent Labs  01/27/14  CHOL 178  HDL 93*  LDLCALC 74  TRIG 41     Assessment/Plan 1. Leg wound, right, initial encounter Open blister, without signs of infection at this time. Nursing and treatment nurse providing daily care and monitoring. To cont current care and notify as needed  2. Edema conts with lasix 80 mg daily, elevation of LE Also eating better per staff, will montior

## 2014-08-24 NOTE — Telephone Encounter (Signed)
Spoke with patient and Charlean Sanfilippo at [facility] regarding labs and xray

## 2014-08-31 ENCOUNTER — Ambulatory Visit (INDEPENDENT_AMBULATORY_CARE_PROVIDER_SITE_OTHER): Payer: Medicare Other | Admitting: Cardiology

## 2014-08-31 ENCOUNTER — Encounter: Payer: Self-pay | Admitting: Cardiology

## 2014-08-31 VITALS — BP 98/62 | HR 100 | Ht <= 58 in | Wt 131.0 lb

## 2014-08-31 DIAGNOSIS — R609 Edema, unspecified: Secondary | ICD-10-CM

## 2014-08-31 DIAGNOSIS — I5043 Acute on chronic combined systolic (congestive) and diastolic (congestive) heart failure: Secondary | ICD-10-CM | POA: Diagnosis not present

## 2014-08-31 NOTE — Progress Notes (Signed)
Cardiology Office Note   Date:  08/31/2014   ID:  Tara Garrett, DOB 22-Nov-1922, MRN [id number]  PCP:  Hollace Kinnier, DO  Cardiologist:   [name], MD   No chief complaint on file.     History of Present Illness: Tara Garrett is a 81 y.o. female who presents for follow-up of systolic heart failure. We initially saw this pleasant 81 year old woman several weeks ago her evaluation of congestive heart failure. She is being seen at the request of Hollace Kinnier DO for worsening congestive heart failure. She had an echocardiogram on 07/14/14 which showed the following: - Left ventricle: The cavity size was mildly dilated. Wall thickness was normal. The estimated ejection fraction was in the range of 15% to 20%. Severe diffuse hypokinesis. Features are consistent with a pseudonormal left ventricular filling pattern, with concomitant abnormal relaxation and increased filling pressure (grade 2 diastolic dysfunction). Cannot exclude apical thrombus. - Mitral valve: Calcified annulus. Mildly thickened leaflets . There was mild to moderate regurgitation directed centrally. - Left atrium: The atrium was moderately to severely dilated. - Right ventricle: Systolic function was moderately reduced. - Pulmonary arteries: Systolic pressure was moderately increased. PA peak pressure: 61 mm Hg (S). - Pericardium, extracardiac: A trivial pericardial effusion was identified. She has normal renal function The patient does not have any past history of documented ischemic heart disease. In March 2015 patient had an episode at her [employer] at [place] in which she apparently fell or passed out and remained on her floor all night until someone discovered her the next day. She was taken to the emergency room and was checked for rib fractures. There was no indication at that time of cardiomegaly or any cardiac issues. She spent several weeks in the nursing facility [facility]  and then she was able to return to her [place]. She was in her usual state of health until approximately 3 weeks ago when she began having increased peripheral edema. Her weight has gone up about 10 pounds over the past several weeks. She apparently had a portable chest x-ray done at [facility] which showed cardiomegaly. We do not have a copy of that report. That led to an echocardiogram which is noted above in which showed severe left ventricular systolic and diastolic dysfunction with ejection fraction of 15-20%. There was evidence of elevated pulmonary artery pressure. She has been treated with Lasix. She was on losartan which was held starting on February 16 after her serum creatinine went to 1.3. Her creatinine has subsequently returned to normal. She was begun on carvedilol 3.125 mg twice a day on February 16. She has been on furosemide 80 mg daily.  She had lab work yesterday which showed stable renal function on the higher dose of Lasix Her lower extremities have shown severe dependent edema with blistering. They are being treated with Ace wraps by the nursing staff. The patient denies any chest pain or angina. Her echocardiogram showed diffuse hypokinesis without regional wall motion abnormalities, suggesting a nonischemic cardiomyopathy. Since last visit she has felt better.  Her exercise tolerance is improved.  She walks with a walker from her apartment to the dining hall.  She is considering a transfer to assisted living and I concur with that and I think that would be a good decision for her long-term. She has chronic low blood pressure but is not having any dizziness or orthostatic symptoms.  However her low blood pressure prevents [place] from aggressively raising her heart failure medicines quickly.  Past Medical History  Diagnosis Date  . Varicose veins   . Hypothyroidism   . Vitamin D deficiency   . Hyperlipidemia   . Hypertension   . Shoulder pain, right 10/2009    X- rayed at  Dr. Berenice Primas.Severe Degenerative Disease. Recommended an artificial shoulder. Saw Dr. Tamera Punt .  Marland Kitchen Right shoulder injury 12/2009  . Leg pain   . Hyperglycemia   . Macular degeneration   . Keratosis seborrheica   . Left knee pain   . Osteoporosis   . Neoplasm of breast     right breast cancer  . Fall 10-09-2011    fell outdoors on driveway fell and hit right side of body and face  . Pain in joint, pelvic region and thigh 05/11/2012  . Pain in joint, site unspecified 02/03/39  . Phlebitis and thrombophlebitis of other deep vessels of lower extremities 01/06/2012  . Other malaise and fatigue 01/06/2012  . Closed fracture of two ribs 10/21/2011, 08/24/2013  . Other specified circulatory system disorders 10/09/2011  . Open wound of knee, leg (except thigh), and ankle, without mention of complication 5/57/3220  . Closed fracture of unspecified part of upper end of humerus 01/13/2005  . Female stress incontinence 02/10/2004  . Herpes zoster without mention of complication 25/42/7062  . Malignant neoplasm of breast (female), unspecified site   . Varicose vein 2014  . Multiple fractures of ribs of left side 08/24/2013  . Gait disorder 08/25/2013    Poor balance     Past Surgical History  Procedure Laterality Date  . Left cataract  1999  . Right mastectomy  1989  . Vesicovaginal fistula closure w/ tah  1967  . Colon polyps  1989, 1992, 1995, 1997  . Cataract extraction  04/2004    Dr. Bing Plume  . Mole removal  03/04/2009    on back by Dr. Derrel Nip  . Endovenous ablation saphenous vein w/ laser  01-30-2012    left greater saphenous vein and sclerotherapy left leg  . Abdominal hysterectomy  1967     Current Outpatient Prescriptions  Medication Sig Dispense Refill  . acetaminophen (TYLENOL) 500 MG tablet Take 1 tablet (500 mg total) by mouth every 6 (six) hours as needed. 30 tablet 0  . aspirin 325 MG EC tablet Take 325 mg by mouth once a week. On Tuesdays     . Calcium Carb-Cholecalciferol  (CALCIUM 500 +D) 500-400 MG-UNIT TABS Take 1 tablet by mouth daily.     . carvedilol (COREG) 3.125 MG tablet Take 1 tablet (3.125 mg total) by mouth 2 (two) times daily with a meal. 60 tablet 3  . cholecalciferol (VITAMIN D) 1000 UNITS tablet Take 2,000 Units by mouth daily.    . fish oil-omega-3 fatty acids 1000 MG capsule Take 1 g by mouth daily.    . furosemide (LASIX) 80 MG tablet Take 80 mg by mouth as directed. 80 mg in the morning and 40 mg in the evening    . losartan (COZAAR) 25 MG tablet Take 25 mg by mouth daily.    . Multiple Vitamins-Minerals (ICAPS) CAPS Take 1 capsule by mouth every other day. Take one daily alt with Protegra    . potassium chloride SA (K-DUR,KLOR-CON) 20 MEQ tablet Take one tablet in morning for potassium 30 tablet 3   No current facility-administered medications for this visit.    Allergies:   Mobic and Tramadol    Social History:  The patient  reports that she quit smoking about 36 years  ago. Her smoking use included Cigarettes. She has never used smokeless tobacco. She reports that she drinks alcohol. She reports that she does not use illicit drugs.   Family History:  The patient's family history includes Cancer in her mother.    ROS:  Please see the history of present illness.   Otherwise, review of systems are positive for none.   All other systems are reviewed and negative.    PHYSICAL EXAM: VS:  BP 98/62 mmHg  Pulse 100  Ht 4\' 10"  (1.473 m)  Wt 131 lb (59.421 kg)  BMI 27.39 kg/m2 , BMI Body mass index is 27.39 kg/(m^2). GEN: Well nourished, well developed, in no acute distress HEENT: normal Neck: no JVD, carotid bruits, or masses Cardiac: RRR; no murmurs, rubs, or gallops,no edema  Respiratory:  clear to auscultation bilaterally, normal work of breathing GI: soft, nontender, nondistended, + BS MS: no deformity or atrophy Skin: warm and dry, no rash Neuro:  Strength and sensation are intact Psych: euthymic mood, full affect   EKG:  EKG  is not ordered today.    Recent Labs: 01/27/2014: ALT 17; TSH 3.76 07/26/2014: Hemoglobin 13.2; Platelets 205 08/09/2014: BUN 24*; Creatinine 0.96; Potassium 3.6; Sodium 138    Lipid Panel    Component Value Date/Time   CHOL 178 01/27/2014   TRIG 41 01/27/2014   HDL 93* 01/27/2014   LDLCALC 74 01/27/2014      Wt Readings from Last 3 Encounters:  08/31/14 131 lb (59.421 kg)  08/24/14 126 lb (57.153 kg)  08/09/14 133 lb (60.328 kg)      Other studies Reviewed: Additional studies/ records that were reviewed today include: Outside labs from yesterday Review of the above records demonstrates: Sodium 137 potassium 3.9 BUN 21 creatinine 0.71 and blood sugar is 97   ASSESSMENT AND PLAN:  1. Acute on chronic combined systolic and diastolic left ventricular dysfunction. 2. Severe peripheral edema secondary to #1  Recommendations: Continue current medication except we will increase her diuretic so that she takes 80 mg of Lasix in the morning and 40 mg in the evening.  We will not increase her losartan yet because her blood pressure is still soft.  She will continue to try to keep her legs elevated whenever possible.  Continue to avoid salt in diet..   Current medicines are reviewed at length with the patient today.  The patient does not have concerns regarding medicines.  The following changes have been made:  Add an additional 40 mg of Lasix in the late afternoon or evening  Labs/ tests ordered today include:   Orders Placed This Encounter  Procedures  . Basic metabolic panel  . Basic metabolic panel     Disposition: She'll be rechecked in 2 months for follow-up office visit EKG and basal metabolic panel I encouraged her to move into the assisted living and I think this will be a good decision for the long-haul.   Signed, [name], MD  08/31/2014 3:01 PM    [facility] [place] [address], [place],   [address] Phone: [phone]; Fax:  [phone]

## 2014-08-31 NOTE — Patient Instructions (Signed)
INCREASE YOUR LASIX (FUROSEMIDE) TO 80 MG IN THE MORNING AND 40 MG IN THE EVENING  Your physician recommends that you schedule a follow-up appointment in: 2 MONTH OV/EKG/BMET

## 2014-09-01 DIAGNOSIS — Z9981 Dependence on supplemental oxygen: Secondary | ICD-10-CM | POA: Diagnosis not present

## 2014-09-01 DIAGNOSIS — M545 Low back pain: Secondary | ICD-10-CM | POA: Diagnosis not present

## 2014-09-01 DIAGNOSIS — I5043 Acute on chronic combined systolic (congestive) and diastolic (congestive) heart failure: Secondary | ICD-10-CM | POA: Diagnosis not present

## 2014-09-01 DIAGNOSIS — I872 Venous insufficiency (chronic) (peripheral): Secondary | ICD-10-CM | POA: Diagnosis not present

## 2014-09-06 DIAGNOSIS — R413 Other amnesia: Secondary | ICD-10-CM | POA: Diagnosis not present

## 2014-09-06 DIAGNOSIS — I5043 Acute on chronic combined systolic (congestive) and diastolic (congestive) heart failure: Secondary | ICD-10-CM | POA: Diagnosis not present

## 2014-09-06 DIAGNOSIS — R262 Difficulty in walking, not elsewhere classified: Secondary | ICD-10-CM | POA: Diagnosis not present

## 2014-09-06 DIAGNOSIS — M545 Low back pain: Secondary | ICD-10-CM | POA: Diagnosis not present

## 2014-09-12 ENCOUNTER — Telehealth: Payer: Self-pay

## 2014-09-12 NOTE — Telephone Encounter (Signed)
Form returned needed Dx put S81.801A, R60.9. Faxed to [phone]

## 2014-10-10 ENCOUNTER — Encounter: Payer: Self-pay | Admitting: Internal Medicine

## 2014-10-11 ENCOUNTER — Encounter: Payer: Self-pay | Admitting: Internal Medicine

## 2014-10-11 ENCOUNTER — Non-Acute Institutional Stay: Payer: Medicare Other | Admitting: Internal Medicine

## 2014-10-11 VITALS — BP 110/62 | HR 80 | Temp 97.5°F | Ht <= 58 in | Wt 116.0 lb

## 2014-10-11 DIAGNOSIS — I872 Venous insufficiency (chronic) (peripheral): Secondary | ICD-10-CM

## 2014-10-11 DIAGNOSIS — R413 Other amnesia: Secondary | ICD-10-CM | POA: Diagnosis not present

## 2014-10-11 DIAGNOSIS — S81801D Unspecified open wound, right lower leg, subsequent encounter: Secondary | ICD-10-CM

## 2014-10-11 DIAGNOSIS — F329 Major depressive disorder, single episode, unspecified: Secondary | ICD-10-CM | POA: Diagnosis not present

## 2014-10-11 DIAGNOSIS — F32A Depression, unspecified: Secondary | ICD-10-CM

## 2014-10-11 DIAGNOSIS — I5022 Chronic systolic (congestive) heart failure: Secondary | ICD-10-CM

## 2014-10-11 DIAGNOSIS — M810 Age-related osteoporosis without current pathological fracture: Secondary | ICD-10-CM | POA: Diagnosis not present

## 2014-10-11 DIAGNOSIS — R269 Unspecified abnormalities of gait and mobility: Secondary | ICD-10-CM | POA: Diagnosis not present

## 2014-10-11 DIAGNOSIS — E039 Hypothyroidism, unspecified: Secondary | ICD-10-CM

## 2014-10-11 DIAGNOSIS — F1010 Alcohol abuse, uncomplicated: Secondary | ICD-10-CM

## 2014-10-11 NOTE — Progress Notes (Signed)
Patient ID: Tara Garrett, female   DOB: July 31, 1922, 81 y.o.   MRN: [id number]   Location:  [facility]  Code Status: DNR  Goals of Care: Advanced Directive information Does patient have an advance directive?: Yes, Type of Advance Directive: [place];Living will;Out of facility DNR (pink MOST or yellow form), Pre-existing out of facility DNR order (yellow form or pink MOST form): Yellow form placed in chart (order not valid for inpatient use)   Allergies  Allergen Reactions  . Mobic [Meloxicam]     Blood pressure goes up  . Tramadol     Blood pressure goes up     Chief Complaint  Patient presents with  . Acute Visit    depression, thyroid, bp, anxiety    HPI: Patient is a 81 y.o.  seen in the office today for  Moved in last Friday to [place].  Only in her current room a few days.  Still not 100% moved in.  Says she has no alternative to assisted living.  Says she slept the second night.  Needs stuffed pillows.  Food is good.  Has skipped meals due to diarrhea.  No diarrhea this am.  Ate breakfast and lunch.   Is frustrated with loss of independence.   Has some discomfort in right leg where she has venous ulcers from edema.   Had therapy this am.   Had dressing change on right leg this am.   Drinking wine only since she fell March of last year.  Her hcpoa is offended that I suggested she was treating her depression with alcohol.    Review of Systems:  Review of Systems  Constitutional: Positive for weight loss and malaise/fatigue. Negative for fever and chills.  HENT: Negative for congestion.   Respiratory: Positive for shortness of breath.   Cardiovascular: Positive for leg swelling. Negative for chest pain.  Gastrointestinal: Positive for diarrhea. Negative for abdominal pain and constipation.  Genitourinary: Negative for dysuria.  Musculoskeletal: Positive for back pain, joint pain and falls.  Neurological: Positive for weakness. Negative for  dizziness and loss of consciousness.  Endo/Heme/Allergies: Bruises/bleeds easily.  Psychiatric/Behavioral: Positive for depression and memory loss. The patient does not have insomnia.      Past Medical History  Diagnosis Date  . Varicose veins   . Hypothyroidism   . Vitamin D deficiency   . Hyperlipidemia   . Hypertension   . Shoulder pain, right 10/2009    X- rayed at Dr. Berenice Primas.Severe Degenerative Disease. Recommended an artificial shoulder. Saw Dr. Tamera Punt .  Marland Kitchen Right shoulder injury 12/2009  . Leg pain   . Hyperglycemia   . Macular degeneration   . Keratosis seborrheica   . Left knee pain   . Osteoporosis   . Neoplasm of breast     right breast cancer  . Fall 10-09-2011    fell outdoors on driveway fell and hit right side of body and face  . Pain in joint, pelvic region and thigh 05/11/2012  . Pain in joint, site unspecified 02/03/39  . Phlebitis and thrombophlebitis of other deep vessels of lower extremities 01/06/2012  . Other malaise and fatigue 01/06/2012  . Closed fracture of two ribs 10/21/2011, 08/24/2013  . Other specified circulatory system disorders 10/09/2011  . Open wound of knee, leg (except thigh), and ankle, without mention of complication 7/49/4496  . Closed fracture of unspecified part of upper end of humerus 01/13/2005  . Female stress incontinence 02/10/2004  . Herpes zoster without  mention of complication 65/08/5463  . Malignant neoplasm of breast (female), unspecified site   . Varicose vein 2014  . Multiple fractures of ribs of left side 08/24/2013  . Gait disorder 08/25/2013    Poor balance     Past Surgical History  Procedure Laterality Date  . Left cataract  1999  . Right mastectomy  1989  . Vesicovaginal fistula closure w/ tah  1967  . Colon polyps  1989, 1992, 1995, 1997  . Cataract extraction  04/2004    Dr. Bing Plume  . Mole removal  03/04/2009    on back by Dr. Derrel Nip  . Endovenous ablation saphenous vein w/ laser  01-30-2012    left greater  saphenous vein and sclerotherapy left leg  . Abdominal hysterectomy  1967    Social History:   reports that she quit smoking about 36 years ago. Her smoking use included Cigarettes. She has never used smokeless tobacco. She reports that she drinks alcohol. She reports that she does not use illicit drugs.  Family History  Problem Relation Age of Onset  . Cancer Mother     Medications: Patient's Medications  New Prescriptions   SERTRALINE (ZOLOFT) 25 MG TABLET    Take 1 tablet (25 mg total) by mouth daily. For two weeks, then 50mg  daily thereafter  Previous Medications   ACETAMINOPHEN (TYLENOL) 500 MG TABLET    Take 1 tablet (500 mg total) by mouth every 6 (six) hours as needed.   ASPIRIN 325 MG EC TABLET    Take 325 mg by mouth once a week. On Tuesdays    CALCIUM CARB-CHOLECALCIFEROL (CALCIUM 500 +D) 500-400 MG-UNIT TABS    Take 1 tablet by mouth daily.    CARVEDILOL (COREG) 3.125 MG TABLET    Take 1 tablet (3.125 mg total) by mouth 2 (two) times daily with a meal.   CHOLECALCIFEROL (VITAMIN D) 1000 UNITS TABLET    Take 2,000 Units by mouth daily.   FISH OIL-OMEGA-3 FATTY ACIDS 1000 MG CAPSULE    Take 1 g by mouth daily.   FUROSEMIDE (LASIX) 80 MG TABLET    Take 80 mg by mouth as directed. 80 mg in the morning and 40 mg in the evening   LOSARTAN (COZAAR) 25 MG TABLET    Take 25 mg by mouth daily.   MELATONIN 10 MG TABS    Take by mouth. One at bedtime   MULTIPLE VITAMINS-MINERALS (ICAPS) CAPS    Take 1 capsule by mouth every other day. Take one daily alt with Protegra   POTASSIUM CHLORIDE SA (K-DUR,KLOR-CON) 20 MEQ TABLET    Take one tablet in morning for potassium  Modified Medications   No medications on file  Discontinued Medications   No medications on file     Physical Exam: Filed Vitals:   10/11/14 1612  BP: 110/62  Pulse: 80  Temp: 97.5 F (36.4 C)  TempSrc: Oral  Height: 4' 9.5" (1.461 m)  Weight: 116 lb (52.617 kg)  SpO2: 91%  Physical Exam   Labs  reviewed: Basic Metabolic Panel:  Recent Labs  01/27/14  07/26/14 07/28/14 08/09/14 1245  NA 123*  < > 135* 132* 138  K 4.3  < > 3.7 3.8 3.6  CL  --   --   --   --  102  CO2  --   --   --   --  31  GLUCOSE  --   --   --   --  107*  BUN 10  < >  21 20 24*  CREATININE 0.4*  < > 1.3* 0.8 0.96  CALCIUM  --   --   --   --  9.2  TSH 3.76  --   --   --   --   < > = values in this interval not displayed. Liver Function Tests:  Recent Labs  01/27/14  AST 25  ALT 17  ALKPHOS 50   No results for input(s): LIPASE, AMYLASE in the last 8760 hours. No results for input(s): AMMONIA in the last 8760 hours. CBC:  Recent Labs  07/26/14  WBC 7.7  HGB 13.2  HCT 40  PLT 205   Lipid Panel:  Recent Labs  01/27/14  CHOL 178  HDL 93*  LDLCALC 74  TRIG 41   Lab Results  Component Value Date   HGBA1C 5.1 01/27/2014    Assessment/Plan 1. Depression -will start on zoloft 25mg  for 2 wks with evening meal, then increase to 50mg  thereafter -f/u on mood in 1 month -cut down to no more than 8oz alcohol in 24 hrs which is still 2 glasses  2. Leg wound, right, subsequent encounter -dressing was changed this am, nursing is monitoring wound and wrapping leg with unnaboot compression due to venous etiology, continues on lasix for edema, too  3. Venous insufficiency (chronic) (peripheral) -cont to elevate feet at rest and use compression hose  4. Chronic systolic CHF (congestive heart failure) -cont ARB and lasix and potassium, monitor intake to make sure she does not get dehydrated, should get bmp at next visit -should use several pillows or wedge pillow to elevate HOB, could use hospital bed and does have diagnosis for insurance coverage if pt and POAs desire  5. Alcohol abuse -pt has been drinking wine and large amts were found in her room -discussed importance of not mixing the alcohol with her medications and risks of confusion and falling -don't want her to treat her depression as she  adjusts to AL with alcohol--staff noting she's drinking more than she used to  6. Senile osteoporosis -cont ca with D and additional D, should also be on treatment  7. Gait disorder -cont to work with PT, OT, came down to appt in wheelchair, unsteady on feet and having falls  8. Hypothyroidism, unspecified hypothyroidism type -not on meds, f/u tsh at future visit  9.  Memory loss -has dementia at this point--unable to safely take her own meds and manage her own finances and perform self-care due to this as well as her CHF causing significant dyspnea on exertion and her gait disorder interfering with safety -consider adding namenda to her regimen  Labs/tests ordered: no new Next appt: 1 mo f/u on depression  Haseeb Fiallos L. Gedeon Brandow, D.O. [facility] [address], [place] [address] Cell Phone (Mon-Fri 8am-5pm):  [phone] On Call:  [phone] & follow prompts after 5pm & weekends Office Phone:  [phone] Office Fax:  [phone]

## 2014-10-12 ENCOUNTER — Encounter: Payer: Self-pay | Admitting: Internal Medicine

## 2014-10-12 MED ORDER — SERTRALINE HCL 25 MG PO TABS: 25.0000 mg | ORAL_TABLET | Freq: Every day | ORAL | Status: DC

## 2014-10-20 LAB — HEPATIC FUNCTION PANEL
ALK PHOS: 37 U/L (ref 25–125)
ALT: 10 U/L (ref 7–35)
AST: 19 U/L (ref 13–35)
Bilirubin, Total: 1 mg/dL

## 2014-10-20 LAB — BASIC METABOLIC PANEL
BUN: 15 mg/dL (ref 4–21)
Creatinine: 0.6 mg/dL (ref 0.5–1.1)
GLUCOSE: 86 mg/dL
POTASSIUM: 3.5 mmol/L (ref 3.4–5.3)
Sodium: 135 mmol/L — AB (ref 137–147)

## 2014-10-20 LAB — CBC AND DIFFERENTIAL
HCT: 34 % — AB (ref 36–46)
Hemoglobin: 11.5 g/dL — AB (ref 12.0–16.0)
Platelets: 237 10*3/uL (ref 150–399)
WBC: 7.9 10^3/mL

## 2014-10-20 LAB — TSH: TSH: 11.38 u[IU]/mL — AB (ref <=5.90)

## 2014-10-26 ENCOUNTER — Encounter: Payer: Self-pay | Admitting: Internal Medicine

## 2014-10-26 ENCOUNTER — Non-Acute Institutional Stay: Payer: Medicare Other | Admitting: Internal Medicine

## 2014-10-26 DIAGNOSIS — R197 Diarrhea, unspecified: Secondary | ICD-10-CM

## 2014-10-26 DIAGNOSIS — R05 Cough: Secondary | ICD-10-CM | POA: Diagnosis not present

## 2014-10-26 DIAGNOSIS — I5022 Chronic systolic (congestive) heart failure: Secondary | ICD-10-CM | POA: Diagnosis not present

## 2014-10-26 DIAGNOSIS — F329 Major depressive disorder, single episode, unspecified: Secondary | ICD-10-CM | POA: Diagnosis not present

## 2014-10-26 DIAGNOSIS — R54 Age-related physical debility: Secondary | ICD-10-CM | POA: Diagnosis not present

## 2014-10-26 DIAGNOSIS — F1010 Alcohol abuse, uncomplicated: Secondary | ICD-10-CM

## 2014-10-26 DIAGNOSIS — R059 Cough, unspecified: Secondary | ICD-10-CM

## 2014-10-26 DIAGNOSIS — F32A Depression, unspecified: Secondary | ICD-10-CM

## 2014-10-26 NOTE — Progress Notes (Signed)
Patient ID: Tara Garrett, female   DOB: [birth year], 81 y.o.   MRN: [id number]  Location:  [facility] Provider:  Chanci Ojala L. Mariea Clonts, D.O., C.M.D.  Code Status:  DNR Goals of care: Advanced Directive information Does patient have an advance directive?: Yes, Type of Advance Directive: [place];Living will;Out of facility DNR (pink MOST or yellow form), Pre-existing out of facility DNR order (yellow form or pink MOST form): Yellow form placed in chart (order not valid for inpatient use), Does patient want to make changes to advanced directive?: No - Patient declined  Chief Complaint  Patient presents with  . Acute Visit    cough, congestion  . Depression  . Diarrhea    HPI:  81 yo white female resident of [facility] who recently moved to assisted living due to declining memory and function in context of acute chf exacerbation.  She has not done well.    She continues to drink large volumes of alcohol over the 8oz permitted as part of her med pass.  She finished a box under her counter in approximately one week's time in addition to the 8oz servings she receives from nursing.  She's been having loose stools sometimes resulting in incontinence episodes.  Her stool has been sent previously for c diff, stool culture, o+p and these have been negative.  Stools are liquidy, not black or tarry and no blood has been seen.  Stool frequency varies from 1-6x in 24 hours. She denies abdominal pain or nausea.  She has not vomited.  She is eating very little.  She is forgetting more.  On the bright side, her spirits have been a bit better since beginning zoloft.  She's also been less argumentative with staff and friends who visit.  She's sleeping better with the wedge pillow.  She does not feel well and now has been noted to be coughing by nursing.  When seen, she denied any coughing and was not witnessed to cough.  She no longer has any edema whatsoever in her legs.  She  remains dyspneic on exertion and does have an EF of only 15%.    Review of Systems:  Review of Systems  Constitutional: Positive for weight loss and malaise/fatigue. Negative for fever, chills and diaphoresis.  HENT: Negative for congestion.   Eyes: Negative for blurred vision.  Respiratory: Positive for cough and shortness of breath. Negative for sputum production and wheezing.   Cardiovascular: Positive for orthopnea. Negative for chest pain, palpitations, leg swelling and PND.  Gastrointestinal: Positive for diarrhea. Negative for abdominal pain, constipation, blood in stool and melena.  Genitourinary: Negative for dysuria.  Musculoskeletal: Negative for falls.  Skin: Negative for itching and rash.  Neurological: Positive for weakness. Negative for dizziness.  Endo/Heme/Allergies: Bruises/bleeds easily.  Psychiatric/Behavioral: Positive for depression, memory loss and substance abuse. The patient is not nervous/anxious and does not have insomnia.     Past Medical History  Diagnosis Date  . Varicose veins   . Hypothyroidism   . Vitamin D deficiency   . Hyperlipidemia   . Hypertension   . Shoulder pain, right 10/2009    X- rayed at Dr. Berenice Primas.Severe Degenerative Disease. Recommended an artificial shoulder. Saw Dr. Tamera Punt .  Marland Kitchen Right shoulder injury 12/2009  . Leg pain   . Hyperglycemia   . Macular degeneration   . Keratosis seborrheica   . Left knee pain   . Osteoporosis   . Neoplasm of breast     right  breast cancer  . Fall 10-09-2011    fell outdoors on driveway fell and hit right side of body and face  . Pain in joint, pelvic region and thigh 05/11/2012  . Pain in joint, site unspecified 02/03/39  . Phlebitis and thrombophlebitis of other deep vessels of lower extremities 01/06/2012  . Other malaise and fatigue 01/06/2012  . Closed fracture of two ribs 10/21/2011, 08/24/2013  . Other specified circulatory system disorders 10/09/2011  . Open wound of knee, leg (except thigh),  and ankle, without mention of complication 3/32/9518  . Closed fracture of unspecified part of upper end of humerus 01/13/2005  . Female stress incontinence 02/10/2004  . Herpes zoster without mention of complication 84/16/6063  . Malignant neoplasm of breast (female), unspecified site   . Varicose vein 2014  . Multiple fractures of ribs of left side 08/24/2013  . Gait disorder 08/25/2013    Poor balance     Patient Active Problem List   Diagnosis Date Noted  . Depression 10/31/2014  . Chronic systolic congestive heart failure 10/31/2014  . Acute on chronic combined systolic and diastolic heart failure 01/60/1093  . Low back pain 07/26/2014  . Gait disorder 08/25/2013  . Closed fracture of two ribs   . Osteoarthritis, hip, bilateral 12/21/2012  . Hypothyroidism   . Hypertension   . Varicose veins of lower extremities with other complications 23/55/7322  . Other malaise and fatigue 01/06/2012  . Varicose veins of lower extremities with inflammation 10/14/2011  . ADENOCARCINOMA, BREAST 10/31/2007  . COLONIC POLYPS 10/31/2007  . DIVERTICULOSIS, COLON 10/31/2007  . Senile osteoporosis 10/31/2007  . Memory loss 10/31/2007    Allergies  Allergen Reactions  . Levaquin [Levofloxacin In D5w] Diarrhea  . Mobic [Meloxicam]     Blood pressure goes up  . Tramadol     Blood pressure goes up     Medications: Patient's Medications  New Prescriptions   No medications on file  Previous Medications   ACETAMINOPHEN (TYLENOL) 500 MG TABLET    Take 500 mg by mouth every 6 (six) hours as needed (for pain).   ASPIRIN 325 MG EC TABLET    Take 325 mg by mouth once a week. On Tuesdays    CALCIUM CARB-CHOLECALCIFEROL (CALCIUM 500 +D) 500-400 MG-UNIT TABS    Take 1 tablet by mouth daily.    CARVEDILOL (COREG) 3.125 MG TABLET    Take 1 tablet (3.125 mg total) by mouth 2 (two) times daily with a meal.   CHOLECALCIFEROL (VITAMIN D) 1000 UNITS TABLET    Take 2,000 Units by mouth daily.   FISH  OIL-OMEGA-3 FATTY ACIDS 1000 MG CAPSULE    Take 1 g by mouth daily.   FUROSEMIDE (LASIX) 80 MG TABLET    Take 80 mg by mouth as directed. 80 mg in the morning   IPRATROPIUM-ALBUTEROL (DUONEB) 0.5-2.5 (3) MG/3ML SOLN    Inhale 0.5 mLs into the lungs daily.   LEVOTHYROXINE (SYNTHROID, LEVOTHROID) 25 MCG TABLET    Take 1 tablet by mouth daily.   LOSARTAN (COZAAR) 25 MG TABLET    Take 25 mg by mouth daily.   MELATONIN 10 MG TABS    Take 10 mg by mouth daily. One at bedtime   MULTIPLE VITAMINS-MINERALS (ICAPS) CAPS    Take 1 capsule by mouth every other day. Take one daily alt with Protegra   POTASSIUM CHLORIDE SA (K-DUR,KLOR-CON) 20 MEQ TABLET    Take 40 mEq by mouth daily.   SERTRALINE (ZOLOFT) 25 MG TABLET  Take 50 mg by mouth daily.  Modified Medications   No medications on file  Discontinued Medications   ACETAMINOPHEN (TYLENOL) 500 MG TABLET    Take 1 tablet (500 mg total) by mouth every 6 (six) hours as needed.   POTASSIUM CHLORIDE SA (K-DUR,KLOR-CON) 20 MEQ TABLET    Take one tablet in morning for potassium   SERTRALINE (ZOLOFT) 25 MG TABLET    Take 1 tablet (25 mg total) by mouth daily. For two weeks, then 50mg  daily thereafter    Physical Exam: Filed Vitals:   10/26/14 1142  BP: 103/68  Pulse: 87  Temp: 97.8 F (36.6 C)  Resp: 22  Height: 4\' 9"  (1.448 m)  Weight: 107 lb 3.2 oz (48.626 kg)  SpO2: 92%   Body mass index is 23.19 kg/(m^2).  Physical Exam  Constitutional: No distress.  Increasingly frail white female was seated on the toilet when seen this time  Cardiovascular: Normal rate, regular rhythm, normal heart sounds and intact distal pulses.   Pulmonary/Chest: Effort normal and breath sounds normal. No respiratory distress. She has no wheezes. She has no rales.  Abdominal: Soft. Bowel sounds are normal. She exhibits no distension. There is no tenderness.  Musculoskeletal: Normal range of motion.  Walks with stooped posture with rollator walker, but mostly is being  pushed lately due to weakness  Neurological: She is alert.  Psychiatric:  More agreeable than during her clinic appt when zoloft was started    Labs reviewed: Basic Metabolic Panel:  Recent Labs  08/09/14 1245 10/20/14 11/04/14 1433  NA 138 135* 132*  K 3.6 3.5 4.2  CL 102  --  95*  CO2 31  --  31  GLUCOSE 107*  --  186*  BUN 24* 15 17  CREATININE 0.96 0.6 0.86  CALCIUM 9.2  --  9.9    Liver Function Tests:  Recent Labs  01/27/14 10/20/14  AST 25 19  ALT 17 10  ALKPHOS 50 37    CBC:  Recent Labs  07/26/14 10/20/14  WBC 7.7 7.9  HGB 13.2 11.5*  HCT 40 34*  PLT 205 237    Lab Results  Component Value Date   TSH 11.38* 10/20/2014   Lab Results  Component Value Date   HGBA1C 5.1 01/27/2014   Lab Results  Component Value Date   CHOL 178 01/27/2014   HDL 93* 01/27/2014   LDLCALC 74 01/27/2014   TRIG 41 01/27/2014    Patient Care Team: [name], DO as PCP - General (Geriatric Medicine) [name], MD (Orthopedic Surgery) [name], MD (Ophthalmology) [facility] [name], MD as Consulting Physician (Cardiology)  Assessment/Plan 1. Diarrhea -suspected to be due to a diet of mostly alcohol and very little food -initial stool studies have been negative -ordered hemocccult x 3 today to rule out malignancy  2. Cough -lungs sound clear and no remaining signs of acute on chronic chf, she's afebrile, so will monitor this--I didn't get to hear it at all myself  3. Alcohol abuse -discussed with her friend who was there to please not bring any additional wine supply as she is to only be having 8oz per day  4. Chronic systolic CHF (congestive heart failure) -stable at present with current regimen -has f/u with Dr. Mare Ferrari upcoming  5. Depression -seems mildly improved with zoloft now up to 50mg  daily  6. Frailty -progressing, is weak, tired, having more difficulty walking, feels short of breath -none of this  is  helped by her significant systolic chf -if no obvious cause emerges for her decline, she could qualify for hospice for her CHF possibly at this point--I am still suspicious that she had a significant cardiac event that brought on that chf exacerbation  Family/ staff Communication: discussed with [place], nurse manager and pt's friend/poa  Labs/tests ordered:  Hemoccult stools x 3  Tara Garrett, D.O. [facility] [address], [place] [address] Cell Phone (Mon-Fri 8am-5pm):  [phone] On Call:  [phone] & follow prompts after 5pm & weekends Office Phone:  [phone] Office Fax:  [phone]

## 2014-10-31 ENCOUNTER — Non-Acute Institutional Stay (SKILLED_NURSING_FACILITY): Payer: Medicare Other | Admitting: Adult Health

## 2014-10-31 ENCOUNTER — Encounter: Payer: Self-pay | Admitting: Adult Health

## 2014-10-31 DIAGNOSIS — I8310 Varicose veins of unspecified lower extremity with inflammation: Secondary | ICD-10-CM | POA: Diagnosis not present

## 2014-10-31 DIAGNOSIS — R0600 Dyspnea, unspecified: Secondary | ICD-10-CM | POA: Diagnosis not present

## 2014-10-31 DIAGNOSIS — I5042 Chronic combined systolic (congestive) and diastolic (congestive) heart failure: Secondary | ICD-10-CM | POA: Insufficient documentation

## 2014-10-31 DIAGNOSIS — F32A Depression, unspecified: Secondary | ICD-10-CM

## 2014-10-31 DIAGNOSIS — I5022 Chronic systolic (congestive) heart failure: Secondary | ICD-10-CM | POA: Diagnosis not present

## 2014-10-31 DIAGNOSIS — F329 Major depressive disorder, single episode, unspecified: Secondary | ICD-10-CM

## 2014-10-31 NOTE — Progress Notes (Signed)
Patient ID: Tara Garrett, female   DOB: March 27, 1923, 81 y.o.   MRN: [id number]    Nursing Home Location:  [facility]   Code Status: DNR with most form, no hospitalizations  N Place of Service: ALF (13)  Chief Complaint  Patient presents with  . Acute Visit    dyspnea    HPI: 81 y.o. female, residing at [facility], assisted living section. I was asked to see her today for dyspnea at rest, first noted today.  She has a hx of CHF (EF 15-20%) and has intermittently required oxygen but has been on RA for several weeks. She is requiring 2L and her sats are 91%.  She denies a cough or sputum production.  She has had a functional decline over time. She has had a poor appetite and signs of depression. She has lost 9 lbs in the past month. She was started on synthroid on 10/20/14 for TSH 11.383.  She was Zoloft started on 5/3 and the staff has noted some improvement in mood.  She has had issues with diarrhea over the past few weeks with a negative workup.  Her Lasix 40 mg was held 5/12 for diarrhea and she continues on Lasix 80 in the morning, with hemoccult neg 5/19.  She reports that the diarrhea has resolved.  The staff states that she is weaker and requires assistance for ADL's and is no longer an AL level of care.   Also of note she has chronic wounds to her lower extremities from venous insuff which have improved significantly per the staff. Her edema has also improved.   Review of Systems:  Review of Systems  Constitutional: Positive for activity change, appetite change, fatigue and unexpected weight change. Negative for fever, chills and diaphoresis.  HENT: Negative for congestion, rhinorrhea, sore throat and trouble swallowing.   Respiratory: Positive for shortness of breath. Negative for cough, wheezing and stridor.   Cardiovascular: Negative for chest pain, palpitations and leg swelling.  Gastrointestinal: Negative for abdominal pain, constipation  and abdominal distention.  Genitourinary: Negative for dysuria and hematuria.  Musculoskeletal: Positive for gait problem. Negative for back pain and arthralgias.       Uses walker  Skin: Positive for wound. Negative for color change, pallor and rash.  Neurological: Negative for dizziness, seizures, speech difficulty, weakness and light-headedness.  Psychiatric/Behavioral: Negative for confusion and agitation.    Medications: Patient's Medications  New Prescriptions   No medications on file  Previous Medications   ACETAMINOPHEN (TYLENOL) 500 MG TABLET    Take 1 tablet (500 mg total) by mouth every 6 (six) hours as needed.   ASPIRIN 325 MG EC TABLET    Take 325 mg by mouth once a week. On Tuesdays    CALCIUM CARB-CHOLECALCIFEROL (CALCIUM 500 +D) 500-400 MG-UNIT TABS    Take 1 tablet by mouth daily.    CARVEDILOL (COREG) 3.125 MG TABLET    Take 1 tablet (3.125 mg total) by mouth 2 (two) times daily with a meal.   CHOLECALCIFEROL (VITAMIN D) 1000 UNITS TABLET    Take 2,000 Units by mouth daily.   FISH OIL-OMEGA-3 FATTY ACIDS 1000 MG CAPSULE    Take 1 g by mouth daily.   FUROSEMIDE (LASIX) 80 MG TABLET    Take 80 mg by mouth as directed. 80 mg in the morning   LOSARTAN (COZAAR) 25 MG TABLET    Take 25 mg by mouth daily.   MELATONIN 10 MG TABS    Take  by mouth. One at bedtime   MULTIPLE VITAMINS-MINERALS (ICAPS) CAPS    Take 1 capsule by mouth every other day. Take one daily alt with Protegra   POTASSIUM CHLORIDE SA (K-DUR,KLOR-CON) 20 MEQ TABLET    Take one tablet in morning for potassium   SERTRALINE (ZOLOFT) 25 MG TABLET    Take 1 tablet (25 mg total) by mouth daily. For two weeks, then 50mg  daily thereafter  Modified Medications   No medications on file  Discontinued Medications   No medications on file     Physical Exam:  Filed Vitals:   10/31/14 1424  BP: 100/61  Pulse: 86  Temp: 97.3 F (36.3 C)  Resp: 20  Weight: 106 lb (48.081 kg)  SpO2: 96%    Physical Exam    Constitutional: She is oriented to person, place, and time. No distress.  Frail, thin white female  HENT:  Mouth/Throat: No oropharyngeal exudate.  Eyes: Pupils are equal, round, and reactive to light.  Neck: No JVD present. No tracheal deviation present. No thyromegaly present.  Cardiovascular: Normal rate and regular rhythm.   No murmur heard. Pulmonary/Chest: Effort normal. She has no wheezes. She has no rales.  Decreased bs with shallow insp and increased WOB. Mild pain on inspiration on the right side with symmetric chest movement  Abdominal: Soft. Bowel sounds are normal. She exhibits no distension. There is no tenderness.  Musculoskeletal: She exhibits no edema or tenderness.  Needs help going from a lying to sitting position  Lymphadenopathy:    She has no cervical adenopathy.  Neurological: She is alert and oriented to person, place, and time. No cranial nerve deficit.  Skin: Skin is warm and dry. She is not diaphoretic.  Right lower ext with healing ulceration with yellow wound bed and slight surrounding erythema  Psychiatric: Affect normal.    Labs reviewed/Significant Diagnostic Results:  Basic Metabolic Panel:  Recent Labs  07/28/14 08/09/14 1245 10/20/14  NA 132* 138 135*  K 3.8 3.6 3.5  CL  --  102  --   CO2  --  31  --   GLUCOSE  --  107*  --   BUN 20 24* 15  CREATININE 0.8 0.96 0.6  CALCIUM  --  9.2  --    Liver Function Tests:  Recent Labs  01/27/14 10/20/14  AST 25 19  ALT 17 10  ALKPHOS 50 37   No results for input(s): LIPASE, AMYLASE in the last 8760 hours. No results for input(s): AMMONIA in the last 8760 hours. CBC:  Recent Labs  07/26/14 10/20/14  WBC 7.7 7.9  HGB 13.2 11.5*  HCT 40 34*  PLT 205 237   CBG: No results for input(s): GLUCAP in the last 8760 hours. TSH:  Recent Labs  01/27/14 10/20/14  TSH 3.76 11.38*   A1C: Lab Results  Component Value Date   HGBA1C 5.1 01/27/2014   Lipid Panel:  Recent Labs  01/27/14   CHOL 178  HDL 93*  LDLCALC 74  TRIG 41   2D echo reviewed 10/31/2014 2/4/16Study Conclusions  - Left ventricle: The cavity size was mildly dilated. Wall thickness was normal. The estimated ejection fraction was in the range of 15% to 20%. Severe diffuse hypokinesis. Features are consistent with a pseudonormal left ventricular filling pattern, with concomitant abnormal relaxation and increased filling pressure (grade 2 diastolic dysfunction). Cannot exclude apical thrombus. - Mitral valve: Calcified annulus. Mildly thickened leaflets . There was mild to moderate regurgitation directed centrally. - Left  atrium: The atrium was moderately to severely dilated. - Right ventricle: Systolic function was moderately reduced. - Pulmonary arteries: Systolic pressure was moderately increased. PA peak pressure: 61 mm Hg (S). - Pericardium, extracardiac: A trivial pericardial effusion was identified.   Assessment/Plan  1)Dyspnea I am concerned for a PE as she noted some pain with inspiration on the right and she has been more sedentary. She is also a former smoker and the noted weight loss is concerning. I suggested a CT of the chest and she declined. She does not want aggressive treatment given her age. I let her know that there is a significant risk of death if she has a PE and it is not treated. She asserted that she understood the risk and would only want treatment for things that could be treated in the facility. She is a DNR. We also completed a most form, indicating no feeding tubes or hospitalizations. Antibiotics and IVF can be used for a defined period. Her POA was notified.  I have ordered a CBC, CMP, and BNP (rule CHF).  She agreed to a CXR.  Duoneb BID and q 6 hrs prn SOB  2) CHF chronic systolic She does not appear volume overloaded at this time on exam. Her weights have trended downward and she has no edema.  We can continue lasix at current dose til her labs  return  3)Venous insufficiency Stable, no edema. Wounds healing. Continue wound care per staff  4) Depression  Improvement in mood per staff. Continue Zoloft   Labs/tests ordered CBC, CMP, BNP, 12 leak EKG    Cindi Carbon, ANP [facility] [phone]

## 2014-11-01 ENCOUNTER — Ambulatory Visit
Admission: RE | Admit: 2014-11-01 | Discharge: 2014-11-01 | Disposition: A | Discharge status: Home / Self Care | Payer: Medicare Other | Source: Ambulatory Visit | Attending: Internal Medicine | Admitting: Internal Medicine

## 2014-11-01 ENCOUNTER — Other Ambulatory Visit: Payer: Self-pay | Admitting: Internal Medicine

## 2014-11-01 DIAGNOSIS — I2699 Other pulmonary embolism without acute cor pulmonale: Secondary | ICD-10-CM

## 2014-11-01 MED ORDER — IOHEXOL 350 MG/ML SOLN
75.0000 mL | Freq: Once | INTRAVENOUS | Status: AC | PRN
Start: 2014-11-01 — End: 2014-11-01
  Administered 2014-11-01: 75 mL via INTRAVENOUS

## 2014-11-04 ENCOUNTER — Other Ambulatory Visit (INDEPENDENT_AMBULATORY_CARE_PROVIDER_SITE_OTHER): Payer: Medicare Other | Admitting: *Deleted

## 2014-11-04 ENCOUNTER — Encounter: Payer: Self-pay | Admitting: Cardiology

## 2014-11-04 ENCOUNTER — Ambulatory Visit (INDEPENDENT_AMBULATORY_CARE_PROVIDER_SITE_OTHER): Payer: Medicare Other | Admitting: Cardiology

## 2014-11-04 VITALS — BP 102/64 | HR 102 | Ht <= 58 in | Wt 105.0 lb

## 2014-11-04 DIAGNOSIS — I5043 Acute on chronic combined systolic (congestive) and diastolic (congestive) heart failure: Secondary | ICD-10-CM

## 2014-11-04 DIAGNOSIS — R609 Edema, unspecified: Secondary | ICD-10-CM | POA: Diagnosis not present

## 2014-11-04 LAB — BASIC METABOLIC PANEL
BUN: 17 mg/dL (ref 6–23)
CALCIUM: 9.9 mg/dL (ref 8.4–10.5)
CHLORIDE: 95 meq/L — AB (ref 96–112)
CO2: 31 mEq/L (ref 19–32)
Creatinine, Ser: 0.86 mg/dL (ref 0.40–1.20)
GFR: 65.86 mL/min (ref >60.00)
GLUCOSE: 186 mg/dL — AB (ref 70–99)
Potassium: 4.2 mEq/L (ref 3.5–5.1)
Sodium: 132 mEq/L — ABNORMAL LOW (ref 135–145)

## 2014-11-04 NOTE — Patient Instructions (Addendum)
Medication Instructions:  Your physician recommends that you continue on your current medications as directed. Please refer to the Current Medication list given to you today.  Labwork: none  Testing/Procedures: none  Follow-Up: Your physician wants you to follow-up in: 4 month ov You will receive a reminder letter in the mail two months in advance. If you don't receive a letter, please call our office to schedule the follow-up appointment.     

## 2014-11-04 NOTE — Progress Notes (Signed)
Cardiology Office Note   Date:  11/04/2014   ID:  Tara Garrett, DOB Nov 19, 1922, MRN [id number]  PCP:  Hollace Kinnier, DO  Cardiologist: [name] MD  No chief complaint on file.     History of Present Illness: Tara Garrett is a 81 y.o. female who presents for scheduled follow-up office visit.  Tara Garrett is a 81 y.o. female who presents for follow-up of systolic heart failure. She is a medical patient of Hollace Kinnier D.O. she has a history of congestive heart failure. She had an echocardiogram on 07/14/14 which showed the following: - Left ventricle: The cavity size was mildly dilated. Wall thickness was normal. The estimated ejection fraction was in the range of 15% to 20%. Severe diffuse hypokinesis. Features are consistent with a pseudonormal left ventricular filling pattern, with concomitant abnormal relaxation and increased filling pressure (grade 2 diastolic dysfunction). Cannot exclude apical thrombus. - Mitral valve: Calcified annulus. Mildly thickened leaflets . There was mild to moderate regurgitation directed centrally. - Left atrium: The atrium was moderately to severely dilated. - Right ventricle: Systolic function was moderately reduced. - Pulmonary arteries: Systolic pressure was moderately increased. PA peak pressure: 61 mm Hg (S). - Pericardium, extracardiac: A trivial pericardial effusion was identified. She has normal renal function The patient does not have any past history of documented ischemic heart disease. Since last visit the patient has had a considerable weight loss which reflects to things.  Her appetite has not been as good and she is eating less.  Also she has diuresed down to essentially dry weight now.  She no longer has the severe peripheral edema that she had several months ago. The patient is now living in the assisted living facility.  However she is requiring more care and is having to have round the  clock help while in assisted living.  She does not want to move to the skilled nursing facility level if she can help it. Recently she was diagnosed with pneumonia.  She had a CT angiogram on 11/01/14 which showed no evidence of pulmonary embolus but did show a right upper lobe pneumonia.  She was initially treated with Levaquin but had a reaction of diarrhea and is now on Augmentin.  Past Medical History  Diagnosis Date  . Varicose veins   . Hypothyroidism   . Vitamin D deficiency   . Hyperlipidemia   . Hypertension   . Shoulder pain, right 10/2009    X- rayed at Dr. Berenice Primas.Severe Degenerative Disease. Recommended an artificial shoulder. Saw Dr. Tamera Punt .  Marland Kitchen Right shoulder injury 12/2009  . Leg pain   . Hyperglycemia   . Macular degeneration   . Keratosis seborrheica   . Left knee pain   . Osteoporosis   . Neoplasm of breast     right breast cancer  . Fall 10-09-2011    fell outdoors on driveway fell and hit right side of body and face  . Pain in joint, pelvic region and thigh 05/11/2012  . Pain in joint, site unspecified 02/03/39  . Phlebitis and thrombophlebitis of other deep vessels of lower extremities 01/06/2012  . Other malaise and fatigue 01/06/2012  . Closed fracture of two ribs 10/21/2011, 08/24/2013  . Other specified circulatory system disorders 10/09/2011  . Open wound of knee, leg (except thigh), and ankle, without mention of complication 0/17/7939  . Closed fracture of unspecified part of upper end of humerus 01/13/2005  . Female stress incontinence 02/10/2004  . Herpes  zoster without mention of complication 54/65/0354  . Malignant neoplasm of breast (female), unspecified site   . Varicose vein 2014  . Multiple fractures of ribs of left side 08/24/2013  . Gait disorder 08/25/2013    Poor balance     Past Surgical History  Procedure Laterality Date  . Left cataract  1999  . Right mastectomy  1989  . Vesicovaginal fistula closure w/ tah  1967  . Colon polyps  1989, 1992,  1995, 1997  . Cataract extraction  04/2004    Dr. Bing Plume  . Mole removal  03/04/2009    on back by Dr. Derrel Nip  . Endovenous ablation saphenous vein w/ laser  01-30-2012    left greater saphenous vein and sclerotherapy left leg  . Abdominal hysterectomy  1967     Current Outpatient Prescriptions  Medication Sig Dispense Refill  . acetaminophen (TYLENOL) 500 MG tablet Take 500 mg by mouth every 6 (six) hours as needed (for pain).    Marland Kitchen aspirin 325 MG EC tablet Take 325 mg by mouth once a week. On Tuesdays     . Calcium Carb-Cholecalciferol (CALCIUM 500 +D) 500-400 MG-UNIT TABS Take 1 tablet by mouth daily.     . carvedilol (COREG) 3.125 MG tablet Take 1 tablet (3.125 mg total) by mouth 2 (two) times daily with a meal. 60 tablet 3  . cholecalciferol (VITAMIN D) 1000 UNITS tablet Take 2,000 Units by mouth daily.    . fish oil-omega-3 fatty acids 1000 MG capsule Take 1 g by mouth daily.    . furosemide (LASIX) 80 MG tablet Take 80 mg by mouth as directed. 80 mg in the morning    . ipratropium-albuterol (DUONEB) 0.5-2.5 (3) MG/3ML SOLN Inhale 0.5 mLs into the lungs daily.  0  . levothyroxine (SYNTHROID, LEVOTHROID) 25 MCG tablet Take 1 tablet by mouth daily.  0  . losartan (COZAAR) 25 MG tablet Take 25 mg by mouth daily.    . Melatonin 10 MG TABS Take 10 mg by mouth daily. One at bedtime    . Multiple Vitamins-Minerals (ICAPS) CAPS Take 1 capsule by mouth every other day. Take one daily alt with Protegra    . potassium chloride SA (K-DUR,KLOR-CON) 20 MEQ tablet Take 40 mEq by mouth daily.    . sertraline (ZOLOFT) 25 MG tablet Take 50 mg by mouth daily.     No current facility-administered medications for this visit.    Allergies:   Levaquin; Mobic; and Tramadol    Social History:  The patient  reports that she quit smoking about 36 years ago. Her smoking use included Cigarettes. She has never used smokeless tobacco. She reports that she drinks alcohol. She reports that she does not use  illicit drugs.   Family History:  The patient's family history includes Cancer in her mother.    ROS:  Please see the history of present illness.   Otherwise, review of systems are positive for none.   All other systems are reviewed and negative.    PHYSICAL EXAM: VS:  BP 102/64 mmHg  Pulse 102  Ht 4\' 9"  (1.448 m)  Wt 105 lb (47.628 kg)  BMI 22.72 kg/m2 , BMI Body mass index is 22.72 kg/(m^2). GEN: Well nourished, well developed, in no acute distress HEENT: normal Neck: no JVD, carotid bruits, or masses Cardiac: RRR; no murmurs, rubs, or gallops,no edema  Respiratory:  clear to auscultation bilaterally, normal work of breathing GI: soft, nontender, nondistended, + BS MS: no deformity or atrophy  Skin: warm and dry, no rash Neuro:  Strength and sensation are intact Psych: euthymic mood, full affect   EKG:  EKG is ordered today. The ekg ordered today demonstrates sinus tachycardia with frequent premature atrial beats, left axis deviation, voltage criteria for LVH.  Since previous tracing of 08/09/14, heart rate is faster   Recent Labs: 10/20/2014: ALT 10; Hemoglobin 11.5*; Platelets 237; TSH 11.38* 11/04/2014: BUN 17; Creatinine 0.86; Potassium 4.2; Sodium 132*    Lipid Panel    Component Value Date/Time   CHOL 178 01/27/2014   TRIG 41 01/27/2014   HDL 93* 01/27/2014   LDLCALC 74 01/27/2014      Wt Readings from Last 3 Encounters:  11/04/14 105 lb (47.628 kg)  10/31/14 106 lb (48.081 kg)  10/26/14 107 lb 3.2 oz (48.626 kg)        ASSESSMENT AND PLAN:  1. Acute on chronic combined systolic and diastolic left ventricular dysfunction. 2. Recent acute pneumonia of right upper lobe 3.  Weight loss of 26 pounds since 08/31/14.  This is a combination of poor appetite as well as response to diuresis  Recommendations: The patient is presently euvolemic.  She will continue current medication.  She is using home oxygen at night.  Recheck in 4 months for office  visit  Labs/ tests ordered today include:   Orders Placed This Encounter  Procedures  . EKG 12-Lead    Signed, [name] MD 11/04/2014 5:53 PM    [facility] [address], [place], [place]  [address] Phone: [phone]; Fax: [phone]

## 2014-11-17 ENCOUNTER — Telehealth: Payer: Self-pay | Admitting: Cardiology

## 2014-11-17 NOTE — Telephone Encounter (Signed)
-----   Message from [name], MD sent at 11/07/2014  2:31 PM EDT ----- Please report.  The blood work is satisfactory.  Potassium is normal.  Kidney function remains normal on diabetics.  Continue current medication.  Send copy to her PCP Dr. Hollace Kinnier

## 2014-11-17 NOTE — Telephone Encounter (Signed)
Advised patient of lab results  

## 2014-11-17 NOTE — Telephone Encounter (Signed)
New problem ° ° °Pt returning your call. °

## 2014-11-22 ENCOUNTER — Encounter: Payer: Self-pay | Admitting: Internal Medicine

## 2014-11-23 ENCOUNTER — Encounter: Payer: Self-pay | Admitting: Internal Medicine

## 2014-11-23 ENCOUNTER — Non-Acute Institutional Stay: Payer: Medicare Other | Admitting: Internal Medicine

## 2014-11-23 VITALS — BP 100/56 | HR 64 | Temp 97.6°F | Wt 103.0 lb

## 2014-11-23 DIAGNOSIS — F329 Major depressive disorder, single episode, unspecified: Secondary | ICD-10-CM

## 2014-11-23 DIAGNOSIS — I5022 Chronic systolic (congestive) heart failure: Secondary | ICD-10-CM

## 2014-11-23 DIAGNOSIS — M25511 Pain in right shoulder: Secondary | ICD-10-CM

## 2014-11-23 DIAGNOSIS — G8911 Acute pain due to trauma: Secondary | ICD-10-CM | POA: Diagnosis not present

## 2014-11-23 DIAGNOSIS — F32A Depression, unspecified: Secondary | ICD-10-CM

## 2014-11-23 DIAGNOSIS — J189 Pneumonia, unspecified organism: Secondary | ICD-10-CM | POA: Diagnosis not present

## 2014-11-23 DIAGNOSIS — R634 Abnormal weight loss: Secondary | ICD-10-CM | POA: Diagnosis not present

## 2014-11-23 DIAGNOSIS — Z9181 History of falling: Secondary | ICD-10-CM

## 2014-11-23 DIAGNOSIS — F1010 Alcohol abuse, uncomplicated: Secondary | ICD-10-CM

## 2014-11-23 MED ORDER — ALBUTEROL SULFATE (2.5 MG/3ML) 0.083% IN NEBU: 2.5000 mg | INHALATION_SOLUTION | Freq: Two times a day (BID) | RESPIRATORY_TRACT | Status: DC

## 2014-11-23 NOTE — Progress Notes (Signed)
Patient ID: Tara Garrett, female   DOB: November 14, 1922, 81 y.o.   MRN: [id number]   Location:  [facility]  Code Status: DNR  Goals of Care:Advanced Directive information Does patient have an advance directive?: Yes, Type of Advance Directive: [place];Out of facility DNR (pink MOST or yellow form), Pre-existing out of facility DNR order (yellow form or pink MOST form): Yellow form placed in chart (order not valid for inpatient use), Does patient want to make changes to advanced directive?: No - Patient declined  Chief Complaint  Patient presents with  . Medical Management of Chronic Issues    CHF, depression, dyspnea  . Fall    on Sunday, hit head and back, c/o's today (R) shoulder hurting    HPI: Patient is a 81 y.o. white female seen in the [facility] today for med mgt of chronic diseases and assessment after her fall on Sunday.  She c/o right shoulder pain and is unable to raise her arm even to 90 degrees due to pain.  She has bruise on her posterior shoulder.    Still losing weight--13 lbs since the beginning of last month.  She claims she is eating better.  Fortunately she no longer has diarrhea and her coughing, congestion and dyspnea have resolved.  She remains weak and debilitated.  Her appetite is still poor.    Review of Systems:  Review of Systems  Constitutional: Positive for malaise/fatigue. Negative for fever and chills.  HENT: Negative for congestion.   Respiratory: Negative for cough and shortness of breath.   Cardiovascular: Negative for chest pain and leg swelling.  Gastrointestinal: Negative for abdominal pain and diarrhea.  Genitourinary: Negative for dysuria.  Musculoskeletal: Positive for joint pain and falls. Negative for myalgias, back pain and neck pain.  Skin: Negative for rash.       Right leg with venous ulcer present with yellow granulation tissue on lateral aspect  Neurological: Positive for weakness. Negative for  dizziness.  Endo/Heme/Allergies: Bruises/bleeds easily.  Psychiatric/Behavioral: Positive for memory loss.       Mood improved    Past Medical History  Diagnosis Date  . Varicose veins   . Hypothyroidism   . Vitamin D deficiency   . Hyperlipidemia   . Hypertension   . Shoulder pain, right 10/2009    X- rayed at Dr. Berenice Primas.Severe Degenerative Disease. Recommended an artificial shoulder. Saw Dr. Tamera Punt .  Marland Kitchen Right shoulder injury 12/2009  . Leg pain   . Hyperglycemia   . Macular degeneration   . Keratosis seborrheica   . Left knee pain   . Osteoporosis   . Neoplasm of breast     right breast cancer  . Fall 10-09-2011    fell outdoors on driveway fell and hit right side of body and face  . Pain in joint, pelvic region and thigh 05/11/2012  . Pain in joint, site unspecified 02/03/39  . Phlebitis and thrombophlebitis of other deep vessels of lower extremities 01/06/2012  . Other malaise and fatigue 01/06/2012  . Closed fracture of two ribs 10/21/2011, 08/24/2013  . Other specified circulatory system disorders 10/09/2011  . Open wound of knee, leg (except thigh), and ankle, without mention of complication 6/31/4970  . Closed fracture of unspecified part of upper end of humerus 01/13/2005  . Female stress incontinence 02/10/2004  . Herpes zoster without mention of complication 26/37/8588  . Malignant neoplasm of breast (female), unspecified site   . Varicose vein 2014  . Multiple  fractures of ribs of left side 08/24/2013  . Gait disorder 08/25/2013    Poor balance     Past Surgical History  Procedure Laterality Date  . Left cataract  1999  . Right mastectomy  1989  . Vesicovaginal fistula closure w/ tah  1967  . Colon polyps  1989, 1992, 1995, 1997  . Cataract extraction  04/2004    Dr. Bing Plume  . Mole removal  03/04/2009    on back by Dr. Derrel Nip  . Endovenous ablation saphenous vein w/ laser  01-30-2012    left greater saphenous vein and sclerotherapy left leg  . Abdominal  hysterectomy  1967    Social History:   reports that she quit smoking about 36 years ago. Her smoking use included Cigarettes. She has never used smokeless tobacco. She reports that she drinks alcohol. She reports that she does not use illicit drugs.  Allergies  Allergen Reactions  . Levaquin [Levofloxacin In D5w] Diarrhea  . Mobic [Meloxicam]     Blood pressure goes up  . Tramadol     Blood pressure goes up     Medications: Patient's Medications  New Prescriptions   No medications on file  Previous Medications   ACETAMINOPHEN (TYLENOL) 500 MG TABLET    Take 500 mg by mouth every 6 (six) hours as needed (for pain).   ASPIRIN 325 MG EC TABLET    Take 325 mg by mouth once a week. On Tuesdays    CALCIUM CARB-CHOLECALCIFEROL (CALCIUM 500 +D) 500-400 MG-UNIT TABS    Take 1 tablet by mouth daily.    CARVEDILOL (COREG) 3.125 MG TABLET    Take 1 tablet (3.125 mg total) by mouth 2 (two) times daily with a meal.   CHOLECALCIFEROL (VITAMIN D) 1000 UNITS TABLET    Take 2,000 Units by mouth daily.   FISH OIL-OMEGA-3 FATTY ACIDS 1000 MG CAPSULE    Take 1 g by mouth daily.   FUROSEMIDE (LASIX) 80 MG TABLET    Take 80 mg by mouth as directed. 80 mg in the morning   IPRATROPIUM-ALBUTEROL (DUONEB) 0.5-2.5 (3) MG/3ML SOLN    Inhale 0.5 mLs into the lungs daily.   LEVOTHYROXINE (SYNTHROID, LEVOTHROID) 25 MCG TABLET    Take 1 tablet by mouth daily.   LOPERAMIDE (IMODIUM) 2 MG CAPSULE    Take by mouth. Take 2 tablets for each loose stool, max 6 in 24 hour   LOSARTAN (COZAAR) 25 MG TABLET    Take 25 mg by mouth daily.   MELATONIN 10 MG TABS    Take 10 mg by mouth daily. One at bedtime   MULTIPLE VITAMINS-MINERALS (ICAPS) CAPS    Take 1 capsule by mouth every other day. Take one daily alt with Protegra   POTASSIUM CHLORIDE SA (K-DUR,KLOR-CON) 20 MEQ TABLET    Take 40 mEq by mouth daily.   SERTRALINE (ZOLOFT) 25 MG TABLET    Take 50 mg by mouth daily.  Modified Medications   No medications on file    Discontinued Medications   No medications on file     Physical Exam: Filed Vitals:   11/23/14 1317  BP: 100/56  Pulse: 64  Temp: 97.6 F (36.4 C)  TempSrc: Oral  Weight: 103 lb (46.72 kg)  SpO2: 93%   Body mass index is 22.28 kg/(m^2). Physical Exam  Constitutional:  Frail white female seated in wheelchair  Cardiovascular:  irreg irreg, systolic murmur present  Pulmonary/Chest: Effort normal and breath sounds normal. She has no rales.  Abdominal: Soft. Bowel sounds are normal. She exhibits no distension. There is no tenderness.  Musculoskeletal: She exhibits tenderness.  Over right upper arm and shoulder, and she is unable to actively flex or abduct the arm and resists me when I attempt passive ROM so difficult to tell if she has a fx, dislocation or rotator cuff injury; no significant swelling present  Neurological: She is alert.  Oriented to place and time  Skin: Skin is warm and dry.  Right posterior shoulder with ecchymoses present; right lateral shin with venous ulcer with yellow granulation tissue     Labs reviewed: Basic Metabolic Panel:  Recent Labs  01/27/14  08/09/14 1245 10/20/14 11/04/14 1433  NA 123*  < > 138 135* 132*  K 4.3  < > 3.6 3.5 4.2  CL  --   --  102  --  95*  CO2  --   --  31  --  31  GLUCOSE  --   --  107*  --  186*  BUN 10  < > 24* 15 17  CREATININE 0.4*  < > 0.96 0.6 0.86  CALCIUM  --   --  9.2  --  9.9  TSH 3.76  --   --  11.38*  --   < > = values in this interval not displayed. Liver Function Tests:  Recent Labs  01/27/14 10/20/14  AST 25 19  ALT 17 10  ALKPHOS 50 37   No results for input(s): LIPASE, AMYLASE in the last 8760 hours. No results for input(s): AMMONIA in the last 8760 hours. CBC:  Recent Labs  07/26/14 10/20/14  WBC 7.7 7.9  HGB 13.2 11.5*  HCT 40 34*  PLT 205 237   Lipid Panel:  Recent Labs  01/27/14  CHOL 178  HDL 93*  LDLCALC 74  TRIG 41   Lab Results  Component Value Date   HGBA1C 5.1  01/27/2014    Patient Care Team: [name], DO as PCP - General (Geriatric Medicine) [name], MD (Orthopedic Surgery) [name], MD (Ophthalmology) [facility] [name], MD as Consulting Physician (Cardiology)  Assessment/Plan 1. Acute pain of right shoulder due to trauma -had fall on Sunday, right shoulder loss of ROM and bruise on posterior aspect--pt will not allow me to passively move the shoulder so xrays ordered to rule out fracture  2. History of fall -in AL room onto back--pt says she hit her head and back, but only has evidence of hitting right shoulder and right arm where she has a skin tear that was dressed with steristrips and a nonadherent dressing with paper tape  3. HCAP (healthcare-associated pneumonia) -seems to be resolved, but need to f/u cxr due to concerns about ongoing weight loss/failure to thrive picture to ensure there is no underlying malignancy   4. Chronic systolic CHF (congestive heart failure) -seems this has stabilized with current regimen  5. Alcohol abuse -has been cut back to just what is given by the nurses (8oz max of wine per day)  6. Depression -mood improved with zoloft so continue this  7. Loss of weight -continuing unfortunately--this concerns me -has been heme negative from colon cancer perspective -f/u cxr for resolution of "pneumonia" for which she did not present entirely typically  Labs/tests ordered:  Right shoulder and chest xrays Next appt:  1 month f/u on weight and shoulder  Honor Fairbank L. Zayyan Mullen, D.O. [facility] [address]  [place], [place] [address] Cell Phone (Mon-Fri 8am-5pm):  [phone] On Call:  [phone] & follow prompts after 5pm & weekends Office Phone:  [phone] Office Fax:  [phone]

## 2014-11-24 ENCOUNTER — Other Ambulatory Visit: Payer: Self-pay | Admitting: Internal Medicine

## 2014-11-24 DIAGNOSIS — R9389 Abnormal findings on diagnostic imaging of other specified body structures: Secondary | ICD-10-CM

## 2014-11-25 ENCOUNTER — Ambulatory Visit
Admission: RE | Admit: 2014-11-25 | Discharge: 2014-11-25 | Disposition: A | Discharge status: Home / Self Care | Payer: Medicare Other | Source: Ambulatory Visit | Attending: Internal Medicine | Admitting: Internal Medicine

## 2014-11-25 DIAGNOSIS — R9389 Abnormal findings on diagnostic imaging of other specified body structures: Secondary | ICD-10-CM

## 2014-12-01 LAB — TSH: TSH: 2.73 u[IU]/mL (ref 0.41–5.90)

## 2014-12-06 ENCOUNTER — Non-Acute Institutional Stay: Payer: Medicare Other | Admitting: Internal Medicine

## 2014-12-06 DIAGNOSIS — R269 Unspecified abnormalities of gait and mobility: Secondary | ICD-10-CM

## 2014-12-06 DIAGNOSIS — E039 Hypothyroidism, unspecified: Secondary | ICD-10-CM | POA: Diagnosis not present

## 2014-12-06 DIAGNOSIS — J181 Lobar pneumonia, unspecified organism: Secondary | ICD-10-CM | POA: Diagnosis not present

## 2014-12-06 DIAGNOSIS — R627 Adult failure to thrive: Secondary | ICD-10-CM | POA: Diagnosis not present

## 2014-12-06 DIAGNOSIS — F329 Major depressive disorder, single episode, unspecified: Secondary | ICD-10-CM

## 2014-12-06 DIAGNOSIS — F1010 Alcohol abuse, uncomplicated: Secondary | ICD-10-CM

## 2014-12-06 DIAGNOSIS — I5042 Chronic combined systolic (congestive) and diastolic (congestive) heart failure: Secondary | ICD-10-CM | POA: Diagnosis not present

## 2014-12-06 DIAGNOSIS — R634 Abnormal weight loss: Secondary | ICD-10-CM

## 2014-12-06 DIAGNOSIS — F32A Depression, unspecified: Secondary | ICD-10-CM

## 2014-12-06 NOTE — Progress Notes (Signed)
Patient ID: Tara Garrett, female   DOB: [birth year], 81 y.o.   MRN: [id number]  Location:  [facility] Provider:  Rexene Edison. Mariea Clonts, D.O., C.M.D.  Code Status:  DNR Goals of care: Advanced Directive information Does patient have an advance directive?: Yes, Type of Advance Directive: [place];Living will;Out of facility DNR (pink MOST or yellow form), Pre-existing out of facility DNR order (yellow form or pink MOST form): Yellow form placed in chart (order not valid for inpatient use), Does patient want to make changes to advanced directive?: No - Patient declined  Chief Complaint  Patient presents with  . Medical Management of Chronic Issues    f/u on weight loss, did "pneumonia" improve on xray?    HPI:  81 yo white female with h/o chronic combined systolic and diastolic chf diagnosed earlier this year, hypertension, hypothyroidism, senile osteoporosis, bilateral hip OA, depression, alcohol abuse, memory loss--probable vascular dementia at this point, frequent falls, and gait disorder (walks with rollator walker) was seen in f/u for her weight loss.  She has now been treated twice for pneumonia, first in May with levaquin for a day (then on call was told her diarrhea was new so it was changed to augmentin, but diarrhea was ongoing) when CT to r/o PE was negative and revealed possible infiltrate and then again with Avelox and mucinex in June when CXR and CT revealed persistence of this infiltrate in the RUL.  She had initially been hypoxic and the CT was obtained to r/o PE.  Since her CHF diagnosis early this year, she's been losing weight and declining in cognition, ambulation, functional status. Her intake has been very poor and she's had difficulty with dyspnea and diarrhea.  She initially lost fluid weight in the context of acute on chronic chf, but her weight has continued to drop precipitously even after a dry weight was obtained.  During her pneumonia treatments, she has  not been running fevers.  The first time, she had a cough and hypoxia, but has never had a cough productive of sputum.  She had heme negative stools x 3 and also c diff toxin was negative.  She was drinking large amts of alcohol in her room which have since been reduced to 8oz total of wine per day administered by staff.  When seen, her weight was down to 99.8lbs from 108lbs 5/28.  She is sleeping most of the day.  Her mood has improved since zoloft was added.    When I saw her, she did not c/o anything except loss of appetite and fatigue.  She admitted to some variable dyspnea on exertion.  She had brought most of her meal back from the evening before and was still storing it in her rollator walker.  She says the pain she was having in her right shoulder has improved with "exercises".  She denies dysphagia and has not been noted to have any difficulty swallowing her food or pills.  Review of Systems:  Review of Systems  Constitutional: Positive for weight loss and malaise/fatigue. Negative for fever, chills and diaphoresis.  Eyes: Negative for blurred vision.  Respiratory: Positive for shortness of breath. Negative for cough, hemoptysis, sputum production and wheezing.   Cardiovascular: Negative for chest pain and leg swelling.  Gastrointestinal: Positive for diarrhea. Negative for nausea, vomiting, abdominal pain, constipation, blood in stool and melena.  Genitourinary: Negative for dysuria, urgency and frequency.  Musculoskeletal: Positive for falls.  Skin: Negative for rash.  Neurological: Positive  for dizziness and weakness. Negative for loss of consciousness and headaches.  Psychiatric/Behavioral: Positive for depression and memory loss.    Past Medical History  Diagnosis Date  . Varicose veins   . Hypothyroidism   . Vitamin D deficiency   . Hyperlipidemia   . Hypertension   . Shoulder pain, right 10/2009    X- rayed at Dr. Berenice Primas.Severe Degenerative Disease. Recommended an artificial  shoulder. Saw Dr. Tamera Punt .  Marland Kitchen Right shoulder injury 12/2009  . Leg pain   . Hyperglycemia   . Macular degeneration   . Keratosis seborrheica   . Left knee pain   . Osteoporosis   . Neoplasm of breast     right breast cancer  . Fall 10-09-2011    fell outdoors on driveway fell and hit right side of body and face  . Pain in joint, pelvic region and thigh 05/11/2012  . Pain in joint, site unspecified 02/03/39  . Phlebitis and thrombophlebitis of other deep vessels of lower extremities 01/06/2012  . Other malaise and fatigue 01/06/2012  . Closed fracture of two ribs 10/21/2011, 08/24/2013  . Other specified circulatory system disorders 10/09/2011  . Open wound of knee, leg (except thigh), and ankle, without mention of complication 1/76/1607  . Closed fracture of unspecified part of upper end of humerus 01/13/2005  . Female stress incontinence 02/10/2004  . Herpes zoster without mention of complication 37/03/6268  . Malignant neoplasm of breast (female), unspecified site   . Varicose vein 2014  . Multiple fractures of ribs of left side 08/24/2013  . Gait disorder 08/25/2013    Poor balance     Patient Active Problem List   Diagnosis Date Noted  . Depression 10/31/2014  . Chronic systolic congestive heart failure 10/31/2014  . Acute on chronic combined systolic and diastolic heart failure 48/54/6270  . Low back pain 07/26/2014  . Gait disorder 08/25/2013  . Closed fracture of two ribs   . Osteoarthritis, hip, bilateral 12/21/2012  . Hypothyroidism   . Hypertension   . Varicose veins of lower extremities with other complications 35/00/9381  . Other malaise and fatigue 01/06/2012  . Varicose veins of lower extremities with inflammation 10/14/2011  . ADENOCARCINOMA, BREAST 10/31/2007  . COLONIC POLYPS 10/31/2007  . DIVERTICULOSIS, COLON 10/31/2007  . Senile osteoporosis 10/31/2007  . Memory loss 10/31/2007    Allergies  Allergen Reactions  . Levaquin [Levofloxacin In D5w] Diarrhea  .  Mobic [Meloxicam]     Blood pressure goes up  . Tramadol     Blood pressure goes up     Medications: Patient's Medications  New Prescriptions   No medications on file  Previous Medications   ACETAMINOPHEN (TYLENOL) 500 MG TABLET    Take 500 mg by mouth every 6 (six) hours as needed (for pain).   ALBUTEROL (PROVENTIL) (2.5 MG/3ML) 0.083% NEBULIZER SOLUTION    Take 3 mLs (2.5 mg total) by nebulization 2 (two) times daily.   ASPIRIN 325 MG EC TABLET    Take 325 mg by mouth once a week. On Tuesdays    CALCIUM CARB-CHOLECALCIFEROL (CALCIUM 500 +D) 500-400 MG-UNIT TABS    Take 1 tablet by mouth daily.    CARVEDILOL (COREG) 3.125 MG TABLET    Take 1 tablet (3.125 mg total) by mouth 2 (two) times daily with a meal.   CHOLECALCIFEROL (VITAMIN D) 1000 UNITS TABLET    Take 2,000 Units by mouth daily.   FISH OIL-OMEGA-3 FATTY ACIDS 1000 MG CAPSULE    Take  1 g by mouth daily.   FUROSEMIDE (LASIX) 80 MG TABLET    Take 80 mg by mouth as directed. 80 mg in the morning   LEVOTHYROXINE (SYNTHROID, LEVOTHROID) 25 MCG TABLET    Take 1 tablet by mouth daily.   LOPERAMIDE (IMODIUM) 2 MG CAPSULE    Take by mouth. Take 2 tablets for each loose stool, max 6 in 24 hour   LOSARTAN (COZAAR) 25 MG TABLET    Take 25 mg by mouth daily.   MELATONIN 10 MG TABS    Take 10 mg by mouth daily. One at bedtime   MULTIPLE VITAMINS-MINERALS (ICAPS) CAPS    Take 1 capsule by mouth every other day. Take one daily alt with Protegra   POTASSIUM CHLORIDE SA (K-DUR,KLOR-CON) 20 MEQ TABLET    Take 40 mEq by mouth daily.   SERTRALINE (ZOLOFT) 25 MG TABLET    Take 50 mg by mouth daily.  Modified Medications   No medications on file  Discontinued Medications   No medications on file    Physical Exam: There were no vitals filed for this visit. There is no weight on file to calculate BMI.  Physical Exam  Constitutional:  Frail, increasingly cachectic white female resting in bed, wearing eye mask that she kept on the entire visit    Neck: No JVD present.  Cardiovascular: Normal rate, regular rhythm and intact distal pulses.   Murmur heard. Pulmonary/Chest: Effort normal and breath sounds normal. No respiratory distress. She has no wheezes. She has no rales. She exhibits no tenderness.  Abdominal: Soft. Bowel sounds are normal.  Musculoskeletal: Normal range of motion. She exhibits no edema or tenderness.  Neurological: She is alert.  Oriented to person and place, does not know date or time and thinks the meal she has in her walker is from lunch (from supper the night before)  Skin: Skin is warm and dry.  Psychiatric:  Judgment, behavior and insight all altered from dementia; spirits are good today    Labs reviewed: Basic Metabolic Panel:  Recent Labs  08/09/14 1245 10/20/14 11/04/14 1433  NA 138 135* 132*  K 3.6 3.5 4.2  CL 102  --  95*  CO2 31  --  31  GLUCOSE 107*  --  186*  BUN 24* 15 17  CREATININE 0.96 0.6 0.86  CALCIUM 9.2  --  9.9    Liver Function Tests:  Recent Labs  01/27/14 10/20/14  AST 25 19  ALT 17 10  ALKPHOS 50 37    CBC:  Recent Labs  07/26/14 10/20/14  WBC 7.7 7.9  HGB 13.2 11.5*  HCT 40 34*  PLT 205 237    Lab Results  Component Value Date   TSH 11.38* 10/20/2014   Lab Results  Component Value Date   HGBA1C 5.1 01/27/2014   Lab Results  Component Value Date   CHOL 178 01/27/2014   HDL 93* 01/27/2014   LDLCALC 74 01/27/2014   TRIG 41 01/27/2014   TSH 2.729 on synthroid 8mcg 12/01/14  Significant Diagnostic Results: 11/01/14 CT angio chest PE w/ and w/o CM:  Patchy right upper lobe infiltrate with associated effusion.No evidence of pulmonary embolism. Emphysematous changes.  11/25/14:  CT chest w/o contrast:  1. Persistent ill-defined density in the right upper lobe, probably infectious in etiology. This appears slightly more prominent than on the prior study. Follow-up is recommended to exclude an underlying mass. 2. New slight interstitial edema at the  lung bases. 3. Slight increased  small pericardial effusion. 4. Persistent cardiomegaly and small right pleural effusion  Patient Care Team: [name], DO as PCP - General (Geriatric Medicine) [name], MD (Orthopedic Surgery) [name], MD (Ophthalmology) [facility] [name], MD as Consulting Physician (Cardiology)  Assessment/Plan 1. Right upper lobe consolidation -due to persistence of area in right upper lobe, weight loss, generalized decline, I have referred her to pulmonary for evaluation for possible lung malignancy -she had no improvement with two courses of antibiotics and has had recent right shoulder pain, as well, which is concerning -there are complicating factors as below that make her picture more challenging  2. Loss of weight -weighed 133 in March and now 99.8 lbs -suspect that she has a malignancy due to the infiltrate in her lung -stools have been heme negative x 3 and Cdiff toxin negative  3. Depression -cont zoloft which has been helpful with mood but she is still sleeping a lot, is weak and has poor appetite, unsteady gait; also has vascular dementia  4. Hypothyroidism, unspecified hypothyroidism type -cont synthroid 8mcg with normal tsh this month  5. Gait disorder -cont use of rollator walker  6. FTT (failure to thrive) in adult -progressive, multifactorial; await pulmonary opinion about possible lung malignancy  7. Chronic combined systolic and diastolic CHF (congestive heart failure) -seemed to spearhead her decline early this year, but has only progressed even after this was stabilized  8. Alcohol abuse -is down to 8oz alcohol per day administered by nursing split between two meals (previously drank large volumes of liquor at home)--HCPOA became very upset when this subject was discussed at her appt in clinic--she supplies the wine  Family/ staff Communication: discussed with nursing, nurse manager and  resident herself  Labs/tests ordered:  Pulmonary consult  Taniesha Glanz L. Wayne Wicklund, D.O. [facility] [address], [place] [address] Cell Phone (Mon-Fri 8am-5pm):  [phone] On Call:  [phone] & follow prompts after 5pm & weekends Office Phone:  [phone] Office Fax:  [phone]

## 2014-12-09 ENCOUNTER — Institutional Professional Consult (permissible substitution): Payer: Medicare Other | Admitting: Internal Medicine

## 2014-12-14 ENCOUNTER — Other Ambulatory Visit (INDEPENDENT_AMBULATORY_CARE_PROVIDER_SITE_OTHER): Payer: Medicare Other

## 2014-12-14 ENCOUNTER — Encounter: Payer: Self-pay | Admitting: Internal Medicine

## 2014-12-14 ENCOUNTER — Ambulatory Visit (INDEPENDENT_AMBULATORY_CARE_PROVIDER_SITE_OTHER): Payer: Medicare Other | Admitting: Internal Medicine

## 2014-12-14 ENCOUNTER — Telehealth: Payer: Self-pay | Admitting: Internal Medicine

## 2014-12-14 ENCOUNTER — Ambulatory Visit (INDEPENDENT_AMBULATORY_CARE_PROVIDER_SITE_OTHER)
Admission: RE | Admit: 2014-12-14 | Discharge: 2014-12-14 | Disposition: A | Discharge status: Home / Self Care | Payer: Medicare Other | Source: Ambulatory Visit | Attending: Internal Medicine | Admitting: Internal Medicine

## 2014-12-14 VITALS — BP 100/62 | HR 85 | Ht <= 58 in | Wt 100.8 lb

## 2014-12-14 DIAGNOSIS — R0600 Dyspnea, unspecified: Secondary | ICD-10-CM

## 2014-12-14 DIAGNOSIS — R918 Other nonspecific abnormal finding of lung field: Secondary | ICD-10-CM | POA: Insufficient documentation

## 2014-12-14 DIAGNOSIS — E039 Hypothyroidism, unspecified: Secondary | ICD-10-CM | POA: Diagnosis not present

## 2014-12-14 LAB — CBC WITH DIFFERENTIAL/PLATELET
BASOS ABS: 0 10*3/uL (ref 0.0–0.1)
Basophils Relative: 0.3 % (ref 0.0–3.0)
Eosinophils Absolute: 0.2 10*3/uL (ref 0.0–0.7)
Eosinophils Relative: 2.7 % (ref 0.0–5.0)
HEMATOCRIT: 35 % — AB (ref 36.0–46.0)
Hemoglobin: 11.8 g/dL — ABNORMAL LOW (ref 12.0–15.0)
Lymphocytes Relative: 11.5 % — ABNORMAL LOW (ref 12.0–46.0)
Lymphs Abs: 0.9 10*3/uL (ref 0.7–4.0)
MCHC: 33.6 g/dL (ref 30.0–36.0)
MCV: 86.4 fl (ref 78.0–100.0)
MONOS PCT: 13.2 % — AB (ref 3.0–12.0)
Monocytes Absolute: 1 10*3/uL (ref 0.1–1.0)
Neutro Abs: 5.4 10*3/uL (ref 1.4–7.7)
Neutrophils Relative %: 72.3 % (ref 43.0–77.0)
PLATELETS: 234 10*3/uL (ref 150.0–400.0)
RBC: 4.06 Mil/uL (ref 3.87–5.11)
RDW: 15.4 % (ref 11.5–15.5)
WBC: 7.5 10*3/uL (ref 4.0–10.5)

## 2014-12-14 LAB — SEDIMENTATION RATE: SED RATE: 20 mm/h (ref 0–22)

## 2014-12-14 LAB — BASIC METABOLIC PANEL
BUN: 26 mg/dL — ABNORMAL HIGH (ref 6–23)
CHLORIDE: 103 meq/L (ref 96–112)
CO2: 30 mEq/L (ref 19–32)
CREATININE: 1.02 mg/dL (ref 0.40–1.20)
Calcium: 10.5 mg/dL (ref 8.4–10.5)
GFR: 54.07 mL/min — ABNORMAL LOW (ref >60.00)
Glucose, Bld: 87 mg/dL (ref 70–99)
Potassium: 4.1 mEq/L (ref 3.5–5.1)
SODIUM: 142 meq/L (ref 135–145)

## 2014-12-14 LAB — BRAIN NATRIURETIC PEPTIDE: Pro B Natriuretic peptide (BNP): 2610 pg/mL — ABNORMAL HIGH (ref 0.0–100.0)

## 2014-12-14 NOTE — Assessment & Plan Note (Addendum)
Lab Results  Component Value Date   TSH 11.38* 10/20/2014    Interesting in setting of wt loss - defer f/u to primary care.

## 2014-12-14 NOTE — Telephone Encounter (Signed)
lmtcb x1 for Tilicia.

## 2014-12-14 NOTE — Progress Notes (Signed)
Subjective:     Patient ID: Tara Garrett, female   DOB: 1923-05-28,   MRN: [id number]  HPI   76 yowf quit smoking in 1980 referred 12/14/2014  by Tara Garrett for persistent infiltrate on cxr/ CT chest p ? Clinical pna.  12/14/2014 1st [facility] office visit/ Tara Garrett   Chief Complaint  Patient presents with  . Pulmonary Consult    Referred by Tara. Hollace Garrett for eval of anbormal ct chest. Pt denies any respiratory co's today.     Pt not able to give coherent hx  From Tara Tara Garrett last progress note:  81 yo white female with h/o chronic combined systolic and diastolic chf diagnosed earlier this year, hypertension, hypothyroidism, senile osteoporosis, bilateral hip OA, depression, alcohol abuse, memory loss--probable vascular dementia at this point, frequent falls, and gait disorder (walks with rollator walker) was seen in f/u for her weight loss. She has now been treated twice for pneumonia, first in May with levaquin for a day (then on call was told her diarrhea was new so it was changed to augmentin, but diarrhea was ongoing) when CT to r/o PE was negative and revealed possible infiltrate and then again with Avelox and mucinex in June when CXR and CT revealed persistence of this infiltrate in the RUL. She had initially been hypoxic and the CT was obtained to r/o PE. Since her CHF diagnosis early this year, she's been losing weight and declining in cognition, ambulation, functional status. Her intake has been very poor and she's had difficulty with dyspnea and diarrhea. She initially lost fluid weight in the context of acute on chronic chf, but her weight has continued to drop precipitously even after a dry weight was obtained. During her pneumonia treatments, she has not been running fevers. The first time, she had a cough and hypoxia, but has never had a cough productive of sputum. She had heme negative stools x 3 and also c diff toxin was negative. She was drinking large amts of  alcohol in her room which have since been reduced to 8oz total of wine per day administered by staff. When seen, her weight was down to 99.8lbs from 108lbs 5/28. She is sleeping most of the day. Her mood has improved since zoloft was added.    At [facility] 12/14/2014 she is comfortable newly on 24h 02 and only able to walk across the room on 02, denies pleuritic or R shoulder pain, never had productive  cough and never had fever per notes or that caretakers are aware of / no obvious aspiration event though note alcohol hx   No obvious day to day or daytime variabilty or assoc chronic cough or cp or chest tightness, subjective wheeze overt sinus or hb symptoms. No unusual exp hx or h/o childhood pna/ asthma or knowledge of premature birth.  Sleeping ok on 02 2lpm without nocturnal  or early am exacerbation  of respiratory  c/o's or need for noct saba. Also denies any obvious fluctuation of symptoms with weather or environmental changes or other aggravating or alleviating factors except as outlined above   Current Medications, Allergies, Complete Past Medical History, Past Surgical History, Family History, and Social History were reviewed in [employer] record.  ROS  The following are not active complaints unless bolded sore throat, dysphagia, dental problems, itching, sneezing,  nasal congestion or excess/ purulent secretions, ear ache,   fever, chills, sweats, unintended wt loss, pleuritic or exertional cp, hemoptysis,  orthopnea pnd or leg  swelling, presyncope, palpitations, abdominal pain, anorexia, nausea, vomiting, diarrhea  or change in bowel or urinary habits, change in stools or urine, dysuria,hematuria,  rash, arthralgias, visual complaints, headache, numbness weakness or ataxia or problems with walking or coordination,  change in mood/affect or memory.           Review of Systems     Objective:   Physical Exam Elderly wf w/c bound oriented to person/place not  time  Wt Readings from Last 3 Encounters:  12/14/14 100 lb 12.8 oz (45.723 kg)  12/06/14 99 lb 12.8 oz (45.269 kg)  11/23/14 103 lb (46.72 kg)    Vital signs reviewed    HEENT: nl dentition, turbinates, and orophanx. Nl external ear canals without cough reflex   NECK :  without JVD/Nodes/TM/ nl carotid upstrokes bilaterally   LUNGS: no acc muscle use, clear to A and P bilaterally without cough on insp or exp maneuvers   CV:  RRR  no s3  - II-III/VI sem  or increase in P2, no edema   ABD:  soft and nontender with nl excursion in the supine position. No bruits or organomegaly, bowel sounds nl  MS:  warm without deformities, calf tenderness, cyanosis or clubbing  SKIN: warm and dry without lesions - bandage R medial lower ext/ankle    NEURO:  alert, approp, no deficits   CXR PA and Lateral:   12/14/2014 :     I personally reviewed images and agree with radiology impression as follows:   1. Severe cardiomegaly. No prominent pulmonary venous congestion. 2. Right mid lung field mild infiltrate. This is most likely secondary to pneumonia. Asymmetric mild pulmonary edema cannot be excluded. Small right pleural effusion.   Labs ordered/ reviewed:    Lab 12/14/14 1238  NA 142  K 4.1  CL 103  CO2 30  BUN 26*  CREATININE 1.02  GLUCOSE 87      Lab 12/14/14 1238  HGB 11.8*  HCT 35.0*  WBC 7.5  PLT 234.0     Lab Results  Component Value Date   TSH 11.38* 10/20/2014     Lab Results  Component Value Date   PROBNP 2610.0* 12/14/2014     Lab Results  Component Value Date   ESRSEDRATE 20 12/14/2014           Assessment:

## 2014-12-14 NOTE — Patient Instructions (Signed)
Please remember to go to the lab and x-ray department downstairs for your tests - we will call you with the results when they are available.      

## 2014-12-14 NOTE — Assessment & Plan Note (Addendum)
This a new rather large area seen obviously on cxr now but not on previous cxr 08/10/14 not seen at all per report so if this is unlikely to be any kind of tumor - it is located in the most posterior aspect of the RUL and there is a small effusion (though this may turn out to be just chf).   With an ESr of only 20 doubt this is any form of boop though a low grade organizing pna is certainly a possibility.  Since she presently has no symptoms and is not a candidate for any form of early intervention I would just watch this with a plain cxr in one month and if effusion increases despite rx of chf the simplest way to make a dx might just be a dx/ therapeutic R thoracentesis.   Total time = 40mreview case with pt/POA  discussion/ counseling/ giving and going over instructions (see avs)  - very limited options reviewed.    Agreed for now to focus on addressing symptoms as they arise. longterm prognosis appera poor with sign cognitive decline and severe chf .

## 2014-12-14 NOTE — Assessment & Plan Note (Addendum)
07/24/14 echo LV  cavity size was mildly dilated. Wall thickness was normal. The estimated ejection fraction was in the range of 15% to 20%. Severe diffuse hypokinesis. Features are consistent with a pseudonormal left ventricular filling pattern, with concomitant abnormal relaxation and increased filling pressure (grade 2 diastolic dysfunction). Cannot exclude apical thrombus. - Mitral valve: Calcified annulus. Mildly thickened leaflets . There was mild to moderate regurgitation directed centrally. - Left atrium: The atrium was moderately to severely dilated. - Right ventricle: Systolic function was moderately reduced. - Pulmonary arteries: Systolic pressure was moderately increased. PA peak pressure: 61 mm Hg (S). - Pericardium, extracardiac: A trivial pericardial effusion was identified.  Improved with rx for chf but remains 02 dep/ no need for inhalers or other w/u for now (the infiltrate is not significant enough to cause sob)

## 2014-12-15 NOTE — Telephone Encounter (Signed)
lmtcb x2 for [employer]

## 2014-12-15 NOTE — Telephone Encounter (Signed)
Tara Garrett returned call  [phone]

## 2014-12-16 NOTE — Telephone Encounter (Signed)
lmtcb x3 for Tara Garrett. If she does not return our call, per our policy this message will be closed.

## 2014-12-16 NOTE — Telephone Encounter (Signed)
Ov note faxed to [phone]

## 2014-12-19 ENCOUNTER — Other Ambulatory Visit: Payer: Self-pay | Admitting: Internal Medicine

## 2014-12-20 ENCOUNTER — Telehealth: Payer: Self-pay | Admitting: *Deleted

## 2014-12-20 NOTE — Telephone Encounter (Signed)
Received call from [place] from [facility] that patient's Sertraline is Approved for a year from 12/19/2014. [phone]

## 2014-12-21 ENCOUNTER — Encounter: Payer: Self-pay | Admitting: Internal Medicine

## 2014-12-21 ENCOUNTER — Non-Acute Institutional Stay: Payer: Medicare Other | Admitting: Internal Medicine

## 2014-12-21 VITALS — BP 96/54 | HR 84 | Temp 97.6°F | Wt 100.0 lb

## 2014-12-21 DIAGNOSIS — F0390 Unspecified dementia without behavioral disturbance: Secondary | ICD-10-CM

## 2014-12-21 DIAGNOSIS — I5042 Chronic combined systolic (congestive) and diastolic (congestive) heart failure: Secondary | ICD-10-CM | POA: Diagnosis not present

## 2014-12-21 DIAGNOSIS — R634 Abnormal weight loss: Secondary | ICD-10-CM | POA: Diagnosis not present

## 2014-12-21 DIAGNOSIS — J181 Lobar pneumonia, unspecified organism: Secondary | ICD-10-CM

## 2014-12-21 DIAGNOSIS — F329 Major depressive disorder, single episode, unspecified: Secondary | ICD-10-CM

## 2014-12-21 DIAGNOSIS — R627 Adult failure to thrive: Secondary | ICD-10-CM | POA: Diagnosis not present

## 2014-12-21 DIAGNOSIS — F32A Depression, unspecified: Secondary | ICD-10-CM

## 2014-12-21 NOTE — Progress Notes (Signed)
Patient ID: Tara Garrett, female   DOB: 1922-08-26, 81 y.o.   MRN: [id number]   Location:  [facility]  Code Status: DNR  Goals of Care:Advanced Directive information Does patient have an advance directive?: Yes, Type of Advance Directive: [place];Living will;Out of facility DNR (pink MOST or yellow form), Pre-existing out of facility DNR order (yellow form or pink MOST form): Yellow form placed in chart (order not valid for inpatient use);Pink MOST form placed in chart (order not valid for inpatient use)  Chief Complaint  Patient presents with  . Medical Management of Chronic Issues    depression, abnormal chest CT, weight loss, dementia. Here with friend BJ    HPI: Patient is a 81 y.o. white female seen in the [facility] today for medical mgt of her chronic illnesses.  Her friend BJ and her husband are here with her (POAs).  This visit is primarily to discuss the ongoing concerning finding on her CXR and CT chest (RUL infiltrate) that has not resolved since May despite two courses of antibiotics, weight loss, depression, and dementia.  She has no other signs of volume overload either--appearing dry.   I reviewed with them my concerns, and reviewed Dr. Gustavus Bryant findings, as well.  BJ's question was "why do we keep reimaging Tara Garrett's chest if there's nothing we can do about it?"  I explained that we could potentially extract a small amount of fluid from her chest to get a confirmation of what we think is going on (lung cancer).  We also discussed, that, even if we do get this diagnosis, Tara Garrett is too frail at this time (performance status is too poor) to tolerate chemotherapy or any surgery which is why a biopsy cannot be performed.  They understand this and would like that we do not do any more imaging b/c it won't change our management.  We discussed the possibility of hospice care to help manage shortness of breath, pain and to keep Tara Garrett comfortable.   Previously, a MOST form has been done that indicates that comfort is her wish and she does not want to be rehospitalized. We left the discussion with no more imaging/related testing, and that Tara Garrett and Midwife, Sil, could discussed hospice care more fully so Tara Garrett can decide.  BJ and her husband are supportive of this option if it's what she wants.  Tara Garrett kept reverting back to a question about having two stomachs which her friends believe she misinterpreted the cardiologist's explanation of her two heart chambers this way.    Tara Garrett did express understanding that she probably has lung cancer that cannot be treated in her frail state.  She said she wishes I had better news for her. Her palliative performance scale is around 30 as she spends much of her time in bed--will ambulate with assistance to meals which she does not eat much of whatsoever.  Review of Systems:  Review of Systems  Constitutional: Positive for weight loss and malaise/fatigue. Negative for fever and chills.       Weight stable at 100 just for the past month but dropped precipitously for the past 6+mos  HENT: Positive for hearing loss. Negative for congestion.   Eyes: Negative for blurred vision.  Respiratory: Positive for shortness of breath. Negative for cough, hemoptysis, sputum production and wheezing.   Cardiovascular: Negative for chest pain, palpitations, orthopnea, leg swelling and PND.  Gastrointestinal: Positive for diarrhea. Negative for abdominal pain, constipation, blood in stool and melena.  Genitourinary: Positive for urgency and frequency. Negative for dysuria.  Musculoskeletal: Positive for falls. Negative for myalgias and joint pain.       Uses rollator walker  Skin: Negative for rash.  Neurological: Positive for dizziness and weakness. Negative for loss of consciousness.  Endo/Heme/Allergies: Bruises/bleeds easily.  Psychiatric/Behavioral: Positive for depression and memory loss.       Spirits have  actually been better since taking the zoloft    Past Medical History  Diagnosis Date  . Varicose veins   . Hypothyroidism   . Vitamin D deficiency   . Hyperlipidemia   . Hypertension   . Shoulder pain, right 10/2009    X- rayed at Dr. Berenice Primas.Severe Degenerative Disease. Recommended an artificial shoulder. Saw Dr. Tamera Punt .  Marland Kitchen Right shoulder injury 12/2009  . Leg pain   . Hyperglycemia   . Macular degeneration   . Keratosis seborrheica   . Left knee pain   . Osteoporosis   . Neoplasm of breast     right breast cancer  . Fall 10-09-2011    fell outdoors on driveway fell and hit right side of body and face  . Pain in joint, pelvic region and thigh 05/11/2012  . Pain in joint, site unspecified 02/03/39  . Phlebitis and thrombophlebitis of other deep vessels of lower extremities 01/06/2012  . Other malaise and fatigue 01/06/2012  . Closed fracture of two ribs 10/21/2011, 08/24/2013  . Other specified circulatory system disorders 10/09/2011  . Open wound of knee, leg (except thigh), and ankle, without mention of complication 09/16/8117  . Closed fracture of unspecified part of upper end of humerus 01/13/2005  . Female stress incontinence 02/10/2004  . Herpes zoster without mention of complication 14/78/2956  . Malignant neoplasm of breast (female), unspecified site   . Varicose vein 2014  . Multiple fractures of ribs of left side 08/24/2013  . Gait disorder 08/25/2013    Poor balance     Past Surgical History  Procedure Laterality Date  . Left cataract  1999  . Right mastectomy  1989  . Vesicovaginal fistula closure w/ tah  1967  . Colon polyps  1989, 1992, 1995, 1997  . Cataract extraction  04/2004    Dr. Bing Plume  . Mole removal  03/04/2009    on back by Dr. Derrel Nip  . Endovenous ablation saphenous vein w/ laser  01-30-2012    left greater saphenous vein and sclerotherapy left leg  . Abdominal hysterectomy  1967    Social History:   reports that she quit smoking about 36 years  ago. Her smoking use included Cigarettes. She has a 10 pack-year smoking history. She has never used smokeless tobacco. She reports that she drinks alcohol. She reports that she does not use illicit drugs.  Allergies  Allergen Reactions  . Levaquin [Levofloxacin In D5w] Diarrhea  . Mobic [Meloxicam]     Blood pressure goes up  . Tramadol     Blood pressure goes up     Medications: Patient's Medications  New Prescriptions   No medications on file  Previous Medications   ACETAMINOPHEN (TYLENOL) 500 MG TABLET    Take 500 mg by mouth every 6 (six) hours as needed (for pain).   ALBUTEROL (PROVENTIL) (2.5 MG/3ML) 0.083% NEBULIZER SOLUTION    Take 3 mLs (2.5 mg total) by nebulization 2 (two) times daily.   ASPIRIN 325 MG EC TABLET    Take 325 mg by mouth once a week. On Tuesdays    CALCIUM CARB-CHOLECALCIFEROL (  CALCIUM 500 +D) 500-400 MG-UNIT TABS    Take 1 tablet by mouth daily.    CARVEDILOL (COREG) 3.125 MG TABLET    Take 1 tablet (3.125 mg total) by mouth 2 (two) times daily with a meal.   CHOLECALCIFEROL (VITAMIN D) 1000 UNITS TABLET    Take 2,000 Units by mouth daily.   FUROSEMIDE (LASIX) 80 MG TABLET    Take 80 mg by mouth as directed. 80 mg in the morning   LEVOTHYROXINE (SYNTHROID, LEVOTHROID) 25 MCG TABLET    Take 1 tablet by mouth daily.   LOPERAMIDE (IMODIUM) 2 MG CAPSULE    Take by mouth. Take 2 tablets for each loose stool, max 6 in 24 hour   LOSARTAN (COZAAR) 25 MG TABLET    Take 25 mg by mouth daily.   MELATONIN 10 MG TABS    Take 10 mg by mouth daily. One at bedtime   NUTRITIONAL SUPPLEMENTS (BOOST BREEZE PO)    Take by mouth. One can twice daily   POTASSIUM CHLORIDE SA (K-DUR,KLOR-CON) 20 MEQ TABLET    Take 40 mEq by mouth daily.   SERTRALINE (ZOLOFT) 25 MG TABLET    Take 25 mg by mouth. Take 1 1/2 tablets (75 mg) once daily  Modified Medications   No medications on file  Discontinued Medications   MULTIPLE VITAMINS-MINERALS (ICAPS) CAPS    Take 1 capsule by mouth every  other day. Take one daily alt with Protegra     Physical Exam: Filed Vitals:   12/21/14 1117  BP: 96/54  Pulse: 84  Temp: 97.6 F (36.4 C)  TempSrc: Oral  Weight: 100 lb (45.36 kg)  SpO2: 91%   Body mass index is 21.63 kg/(m^2). Physical Exam  Constitutional:  Frail white female sitting in wheelchair today  HENT:  Head: Normocephalic and atraumatic.  Neck: No JVD present.  Cardiovascular: Intact distal pulses.   irreg irreg  Pulmonary/Chest: Effort normal. She has no rales.  Diminished breath sounds on right  Abdominal: Soft. Bowel sounds are normal. She exhibits no distension. There is no tenderness.  Musculoskeletal: Normal range of motion. She exhibits no tenderness.  Has lost most of her muscle mass (cachectic)  Neurological: She is alert.  Oriented to person and place, not time  Skin: Skin is warm and dry.     Labs reviewed: Basic Metabolic Panel:  Recent Labs  01/27/14  08/09/14 1245 10/20/14 11/04/14 1433 12/14/14 1238  NA 123*  < > 138 135* 132* 142  K 4.3  < > 3.6 3.5 4.2 4.1  CL  --   --  102  --  95* 103  CO2  --   --  31  --  31 30  GLUCOSE  --   --  107*  --  186* 87  BUN 10  < > 24* 15 17 26*  CREATININE 0.4*  < > 0.96 0.6 0.86 1.02  CALCIUM  --   --  9.2  --  9.9 10.5  TSH 3.76  --   --  11.38*  --   --   < > = values in this interval not displayed. Liver Function Tests:  Recent Labs  01/27/14 10/20/14  AST 25 19  ALT 17 10  ALKPHOS 50 37   No results for input(s): LIPASE, AMYLASE in the last 8760 hours. No results for input(s): AMMONIA in the last 8760 hours. CBC:  Recent Labs  07/26/14 10/20/14 12/14/14 1238  WBC 7.7 7.9 7.5  NEUTROABS  --   --  5.4  HGB 13.2 11.5* 11.8*  HCT 40 34* 35.0*  MCV  --   --  86.4  PLT 205 237 234.0   Lipid Panel:  Recent Labs  01/27/14  CHOL 178  HDL 93*  LDLCALC 74  TRIG 41   Lab Results  Component Value Date   HGBA1C 5.1 01/27/2014    Procedures since last appt: 12/14/14:   CXR-2view:  1. Severe cardiomegaly. No prominent pulmonary venous congestion. 2. Right mid lung field mild infiltrate. This is most likely secondary to pneumonia. Asymmetric mild pulmonary edema cannot be excluded. Small right pleural effusion.  Patient Care Team: [name], DO as PCP - General (Geriatric Medicine) [name], MD (Orthopedic Surgery) [name], MD (Ophthalmology) [facility] [name], MD as Consulting Physician (Cardiology)  Assessment/Plan 1. Right upper lobe consolidation -lung cancer suspect, but pt too frail to tolerate biopsy and decision made today to discontinue any further imaging b/c it will not change mgt -Tara Garrett is to discuss hospice option with Sil b/c I believe she should beet criteria  2. Loss of weight -weight stayed around 100 over this past month, but she's lost 33 lbs since March (some fluid weight initially, but continued after chf stabilized) -again, lung cancer is suspected (cannot confirm due to her frailty and she and her POAs don't want more imaging)  3. Depression -cont zoloft which has been quite helpful -still not very active, spends most time in bed, but I think this is due to weakness from her medical problems more than psychological  4. FTT (failure to thrive) in adult -due to suspected malignancy, chf, dementia, depression, and ongoing diarrhea (etiology unclear for this, too as alcohol intake has been drastically reduced now)  5. Chronic combined systolic and diastolic CHF (congestive heart failure) -does have small pleural effusion and cardiomegaly on imaging, but I suspect that is malignant as she has no other signs of volume overload, in fact, appears dry on exam -on reduced dose cozaar to 25mg  and she's on coreg 3.125mg  due to hypotension and suspect orthostasis  6. Dementia, without behavioral disturbance -not on aricept due to diarrhea -does not make sense to add namenda at this time with  her prognosis  Labs/tests ordered:  none Next appt:  2 mos in clinic, but I will see her prn in her AL room  Jaquan Sadowsky L. Vinia Jemmott, D.O. [facility] [address], [place] [address] Cell Phone (Mon-Fri 8am-5pm):  [phone] On Call:  [phone] & follow prompts after 5pm & weekends Office Phone:  [phone] Office Fax:  [phone]

## 2014-12-28 DIAGNOSIS — I509 Heart failure, unspecified: Secondary | ICD-10-CM | POA: Diagnosis not present

## 2014-12-28 DIAGNOSIS — N189 Chronic kidney disease, unspecified: Secondary | ICD-10-CM | POA: Diagnosis not present

## 2014-12-28 DIAGNOSIS — I10 Essential (primary) hypertension: Secondary | ICD-10-CM | POA: Diagnosis not present

## 2014-12-28 DIAGNOSIS — C50919 Malignant neoplasm of unspecified site of unspecified female breast: Secondary | ICD-10-CM | POA: Diagnosis not present

## 2014-12-28 DIAGNOSIS — F0150 Vascular dementia without behavioral disturbance: Secondary | ICD-10-CM | POA: Diagnosis not present

## 2014-12-28 DIAGNOSIS — E785 Hyperlipidemia, unspecified: Secondary | ICD-10-CM | POA: Diagnosis not present

## 2014-12-28 DIAGNOSIS — R630 Anorexia: Secondary | ICD-10-CM | POA: Diagnosis not present

## 2014-12-28 DIAGNOSIS — F339 Major depressive disorder, recurrent, unspecified: Secondary | ICD-10-CM | POA: Diagnosis not present

## 2014-12-28 DIAGNOSIS — I349 Nonrheumatic mitral valve disorder, unspecified: Secondary | ICD-10-CM | POA: Diagnosis not present

## 2014-12-28 DIAGNOSIS — R634 Abnormal weight loss: Secondary | ICD-10-CM | POA: Diagnosis not present

## 2014-12-28 DIAGNOSIS — E039 Hypothyroidism, unspecified: Secondary | ICD-10-CM | POA: Diagnosis not present

## 2014-12-30 DIAGNOSIS — F0150 Vascular dementia without behavioral disturbance: Secondary | ICD-10-CM | POA: Diagnosis not present

## 2014-12-30 DIAGNOSIS — I10 Essential (primary) hypertension: Secondary | ICD-10-CM | POA: Diagnosis not present

## 2014-12-30 DIAGNOSIS — E785 Hyperlipidemia, unspecified: Secondary | ICD-10-CM | POA: Diagnosis not present

## 2014-12-30 DIAGNOSIS — I349 Nonrheumatic mitral valve disorder, unspecified: Secondary | ICD-10-CM | POA: Diagnosis not present

## 2014-12-30 DIAGNOSIS — N189 Chronic kidney disease, unspecified: Secondary | ICD-10-CM | POA: Diagnosis not present

## 2014-12-30 DIAGNOSIS — I509 Heart failure, unspecified: Secondary | ICD-10-CM | POA: Diagnosis not present

## 2015-01-04 DIAGNOSIS — F0150 Vascular dementia without behavioral disturbance: Secondary | ICD-10-CM | POA: Diagnosis not present

## 2015-01-04 DIAGNOSIS — E785 Hyperlipidemia, unspecified: Secondary | ICD-10-CM | POA: Diagnosis not present

## 2015-01-04 DIAGNOSIS — N189 Chronic kidney disease, unspecified: Secondary | ICD-10-CM | POA: Diagnosis not present

## 2015-01-04 DIAGNOSIS — I509 Heart failure, unspecified: Secondary | ICD-10-CM | POA: Diagnosis not present

## 2015-01-04 DIAGNOSIS — I10 Essential (primary) hypertension: Secondary | ICD-10-CM | POA: Diagnosis not present

## 2015-01-04 DIAGNOSIS — I349 Nonrheumatic mitral valve disorder, unspecified: Secondary | ICD-10-CM | POA: Diagnosis not present

## 2015-01-05 DIAGNOSIS — E785 Hyperlipidemia, unspecified: Secondary | ICD-10-CM | POA: Diagnosis not present

## 2015-01-05 DIAGNOSIS — I349 Nonrheumatic mitral valve disorder, unspecified: Secondary | ICD-10-CM | POA: Diagnosis not present

## 2015-01-05 DIAGNOSIS — F0150 Vascular dementia without behavioral disturbance: Secondary | ICD-10-CM | POA: Diagnosis not present

## 2015-01-05 DIAGNOSIS — I10 Essential (primary) hypertension: Secondary | ICD-10-CM | POA: Diagnosis not present

## 2015-01-05 DIAGNOSIS — N189 Chronic kidney disease, unspecified: Secondary | ICD-10-CM | POA: Diagnosis not present

## 2015-01-05 DIAGNOSIS — I509 Heart failure, unspecified: Secondary | ICD-10-CM | POA: Diagnosis not present

## 2015-01-09 DIAGNOSIS — I509 Heart failure, unspecified: Secondary | ICD-10-CM | POA: Diagnosis not present

## 2015-01-09 DIAGNOSIS — C50919 Malignant neoplasm of unspecified site of unspecified female breast: Secondary | ICD-10-CM | POA: Diagnosis not present

## 2015-01-09 DIAGNOSIS — N189 Chronic kidney disease, unspecified: Secondary | ICD-10-CM | POA: Diagnosis not present

## 2015-01-09 DIAGNOSIS — I349 Nonrheumatic mitral valve disorder, unspecified: Secondary | ICD-10-CM | POA: Diagnosis not present

## 2015-01-09 DIAGNOSIS — F0150 Vascular dementia without behavioral disturbance: Secondary | ICD-10-CM | POA: Diagnosis not present

## 2015-01-09 DIAGNOSIS — E785 Hyperlipidemia, unspecified: Secondary | ICD-10-CM | POA: Diagnosis not present

## 2015-01-09 DIAGNOSIS — E039 Hypothyroidism, unspecified: Secondary | ICD-10-CM | POA: Diagnosis not present

## 2015-01-09 DIAGNOSIS — R634 Abnormal weight loss: Secondary | ICD-10-CM | POA: Diagnosis not present

## 2015-01-09 DIAGNOSIS — F339 Major depressive disorder, recurrent, unspecified: Secondary | ICD-10-CM | POA: Diagnosis not present

## 2015-01-09 DIAGNOSIS — R630 Anorexia: Secondary | ICD-10-CM | POA: Diagnosis not present

## 2015-01-09 DIAGNOSIS — I10 Essential (primary) hypertension: Secondary | ICD-10-CM | POA: Diagnosis not present

## 2015-01-12 DIAGNOSIS — F0150 Vascular dementia without behavioral disturbance: Secondary | ICD-10-CM | POA: Diagnosis not present

## 2015-01-12 DIAGNOSIS — E785 Hyperlipidemia, unspecified: Secondary | ICD-10-CM | POA: Diagnosis not present

## 2015-01-12 DIAGNOSIS — N189 Chronic kidney disease, unspecified: Secondary | ICD-10-CM | POA: Diagnosis not present

## 2015-01-12 DIAGNOSIS — I349 Nonrheumatic mitral valve disorder, unspecified: Secondary | ICD-10-CM | POA: Diagnosis not present

## 2015-01-12 DIAGNOSIS — I10 Essential (primary) hypertension: Secondary | ICD-10-CM | POA: Diagnosis not present

## 2015-01-12 DIAGNOSIS — I509 Heart failure, unspecified: Secondary | ICD-10-CM | POA: Diagnosis not present

## 2015-01-13 DIAGNOSIS — F0150 Vascular dementia without behavioral disturbance: Secondary | ICD-10-CM | POA: Diagnosis not present

## 2015-01-13 DIAGNOSIS — I349 Nonrheumatic mitral valve disorder, unspecified: Secondary | ICD-10-CM | POA: Diagnosis not present

## 2015-01-13 DIAGNOSIS — N189 Chronic kidney disease, unspecified: Secondary | ICD-10-CM | POA: Diagnosis not present

## 2015-01-13 DIAGNOSIS — E785 Hyperlipidemia, unspecified: Secondary | ICD-10-CM | POA: Diagnosis not present

## 2015-01-13 DIAGNOSIS — I10 Essential (primary) hypertension: Secondary | ICD-10-CM | POA: Diagnosis not present

## 2015-01-13 DIAGNOSIS — I509 Heart failure, unspecified: Secondary | ICD-10-CM | POA: Diagnosis not present

## 2015-01-15 DIAGNOSIS — I349 Nonrheumatic mitral valve disorder, unspecified: Secondary | ICD-10-CM | POA: Diagnosis not present

## 2015-01-15 DIAGNOSIS — E785 Hyperlipidemia, unspecified: Secondary | ICD-10-CM | POA: Diagnosis not present

## 2015-01-15 DIAGNOSIS — I10 Essential (primary) hypertension: Secondary | ICD-10-CM | POA: Diagnosis not present

## 2015-01-15 DIAGNOSIS — N189 Chronic kidney disease, unspecified: Secondary | ICD-10-CM | POA: Diagnosis not present

## 2015-01-15 DIAGNOSIS — F0150 Vascular dementia without behavioral disturbance: Secondary | ICD-10-CM | POA: Diagnosis not present

## 2015-01-15 DIAGNOSIS — I509 Heart failure, unspecified: Secondary | ICD-10-CM | POA: Diagnosis not present

## 2015-01-16 DIAGNOSIS — N189 Chronic kidney disease, unspecified: Secondary | ICD-10-CM | POA: Diagnosis not present

## 2015-01-16 DIAGNOSIS — E785 Hyperlipidemia, unspecified: Secondary | ICD-10-CM | POA: Diagnosis not present

## 2015-01-16 DIAGNOSIS — I10 Essential (primary) hypertension: Secondary | ICD-10-CM | POA: Diagnosis not present

## 2015-01-16 DIAGNOSIS — I509 Heart failure, unspecified: Secondary | ICD-10-CM | POA: Diagnosis not present

## 2015-01-16 DIAGNOSIS — F0150 Vascular dementia without behavioral disturbance: Secondary | ICD-10-CM | POA: Diagnosis not present

## 2015-01-16 DIAGNOSIS — I349 Nonrheumatic mitral valve disorder, unspecified: Secondary | ICD-10-CM | POA: Diagnosis not present

## 2015-01-17 DIAGNOSIS — E785 Hyperlipidemia, unspecified: Secondary | ICD-10-CM | POA: Diagnosis not present

## 2015-01-17 DIAGNOSIS — I509 Heart failure, unspecified: Secondary | ICD-10-CM | POA: Diagnosis not present

## 2015-01-17 DIAGNOSIS — I10 Essential (primary) hypertension: Secondary | ICD-10-CM | POA: Diagnosis not present

## 2015-01-17 DIAGNOSIS — N189 Chronic kidney disease, unspecified: Secondary | ICD-10-CM | POA: Diagnosis not present

## 2015-01-17 DIAGNOSIS — F0150 Vascular dementia without behavioral disturbance: Secondary | ICD-10-CM | POA: Diagnosis not present

## 2015-01-17 DIAGNOSIS — I349 Nonrheumatic mitral valve disorder, unspecified: Secondary | ICD-10-CM | POA: Diagnosis not present

## 2015-01-18 ENCOUNTER — Non-Acute Institutional Stay: Payer: Medicare Other | Admitting: Internal Medicine

## 2015-01-18 ENCOUNTER — Encounter: Payer: Self-pay | Admitting: Internal Medicine

## 2015-01-18 VITALS — BP 100/62 | HR 82 | Temp 97.5°F | Wt 101.0 lb

## 2015-01-18 DIAGNOSIS — R05 Cough: Secondary | ICD-10-CM | POA: Diagnosis not present

## 2015-01-18 DIAGNOSIS — I5042 Chronic combined systolic (congestive) and diastolic (congestive) heart failure: Secondary | ICD-10-CM | POA: Diagnosis not present

## 2015-01-18 DIAGNOSIS — R627 Adult failure to thrive: Secondary | ICD-10-CM

## 2015-01-18 DIAGNOSIS — F0150 Vascular dementia without behavioral disturbance: Secondary | ICD-10-CM | POA: Diagnosis not present

## 2015-01-18 DIAGNOSIS — J0100 Acute maxillary sinusitis, unspecified: Secondary | ICD-10-CM | POA: Diagnosis not present

## 2015-01-18 DIAGNOSIS — I10 Essential (primary) hypertension: Secondary | ICD-10-CM | POA: Diagnosis not present

## 2015-01-18 DIAGNOSIS — I349 Nonrheumatic mitral valve disorder, unspecified: Secondary | ICD-10-CM | POA: Diagnosis not present

## 2015-01-18 DIAGNOSIS — R059 Cough, unspecified: Secondary | ICD-10-CM

## 2015-01-18 DIAGNOSIS — N189 Chronic kidney disease, unspecified: Secondary | ICD-10-CM | POA: Diagnosis not present

## 2015-01-18 DIAGNOSIS — J90 Pleural effusion, not elsewhere classified: Secondary | ICD-10-CM | POA: Diagnosis not present

## 2015-01-18 DIAGNOSIS — E785 Hyperlipidemia, unspecified: Secondary | ICD-10-CM | POA: Diagnosis not present

## 2015-01-18 DIAGNOSIS — F0390 Unspecified dementia without behavioral disturbance: Secondary | ICD-10-CM | POA: Diagnosis not present

## 2015-01-18 DIAGNOSIS — R918 Other nonspecific abnormal finding of lung field: Secondary | ICD-10-CM | POA: Diagnosis not present

## 2015-01-18 DIAGNOSIS — I509 Heart failure, unspecified: Secondary | ICD-10-CM | POA: Diagnosis not present

## 2015-01-18 NOTE — Progress Notes (Signed)
Patient ID: Tara Garrett, female   DOB: [birth year], 81 y.o.   MRN: [id number]   Location:  [facility]  Code Status:  DNR  Goals of Care:Advanced Directive information Does patient have an advance directive?: Yes, Type of Advance Directive: [place];Living will;Out of facility DNR (pink MOST or yellow form), Pre-existing out of facility DNR order (yellow form or pink MOST form): Pink MOST form placed in chart (order not valid for inpatient use);Yellow form placed in chart (order not valid for inpatient use), Does patient want to make changes to advanced directive?: No - Patient declined  Chief Complaint  Patient presents with  . Cough    follow-up.  here with friend Inez Catalina (BJ)    HPI: Patient is a 75 y.o. white female with h/o suspected lung cancer seen in the [facility] today for cough.  She notes she is having a dry cough for the past several days.  She's been afebrile (T max in system was 98.8).  She does not otherwise feel bad.  Does say she has a blocked area and is pointing to her left sinus--she cannot clearly tell me what she is talking about.  Her CXR that was done 01/13/15 showed a right mid lung opacity and effusion.  Previously the noted area was said to be in the right lower lung.  She is on tessalon perles which have helped the coughing.  She is sleeping well at night now.  She has been unable to expectorate any mucus.  She is on hospice care.    We discussed that she could be treated with antibiotics due to the coughing, but it seems that she does not have pneumonia.  I suspect her effusion from the lung cancer is causing the cough.  Review of Systems:  Review of Systems  Constitutional: Positive for weight loss and malaise/fatigue. Negative for fever and chills.  HENT: Positive for congestion and hearing loss.        Left sinus  Respiratory: Positive for cough and shortness of breath. Negative for sputum production and wheezing.     Cardiovascular: Negative for chest pain and leg swelling.  Gastrointestinal: Negative for abdominal pain, diarrhea, constipation, blood in stool and melena.  Genitourinary: Negative for dysuria.  Musculoskeletal: Negative for falls.  Skin: Negative for rash.  Neurological: Positive for weakness. Negative for dizziness and headaches.  Endo/Heme/Allergies: Bruises/bleeds easily.  Psychiatric/Behavioral: Positive for depression and memory loss.    Past Medical History  Diagnosis Date  . Varicose veins   . Hypothyroidism   . Vitamin D deficiency   . Hyperlipidemia   . Hypertension   . Shoulder pain, right 10/2009    X- rayed at Dr. Berenice Primas.Severe Degenerative Disease. Recommended an artificial shoulder. Saw Dr. Tamera Punt .  Marland Kitchen Right shoulder injury 12/2009  . Leg pain   . Hyperglycemia   . Macular degeneration   . Keratosis seborrheica   . Left knee pain   . Osteoporosis   . Neoplasm of breast     right breast cancer  . Fall 10-09-2011    fell outdoors on driveway fell and hit right side of body and face  . Pain in joint, pelvic region and thigh 05/11/2012  . Pain in joint, site unspecified 02/03/39  . Phlebitis and thrombophlebitis of other deep vessels of lower extremities 01/06/2012  . Other malaise and fatigue 01/06/2012  . Closed fracture of two ribs 10/21/2011, 08/24/2013  . Other specified circulatory system disorders 10/09/2011  .  Open wound of knee, leg (except thigh), and ankle, without mention of complication 3/66/2947  . Closed fracture of unspecified part of upper end of humerus 01/13/2005  . Female stress incontinence 02/10/2004  . Herpes zoster without mention of complication 65/46/5035  . Malignant neoplasm of breast (female), unspecified site   . Varicose vein 2014  . Multiple fractures of ribs of left side 08/24/2013  . Gait disorder 08/25/2013    Poor balance     Past Surgical History  Procedure Laterality Date  . Left cataract  1999  . Right mastectomy  1989  .  Vesicovaginal fistula closure w/ tah  1967  . Colon polyps  1989, 1992, 1995, 1997  . Cataract extraction  04/2004    Dr. Bing Plume  . Mole removal  03/04/2009    on back by Dr. Derrel Nip  . Endovenous ablation saphenous vein w/ laser  01-30-2012    left greater saphenous vein and sclerotherapy left leg  . Abdominal hysterectomy  1967    Social History:   reports that she quit smoking about 36 years ago. Her smoking use included Cigarettes. She has a 10 pack-year smoking history. She has never used smokeless tobacco. She reports that she drinks alcohol. She reports that she does not use illicit drugs.  Allergies  Allergen Reactions  . Levaquin [Levofloxacin In D5w] Diarrhea  . Mobic [Meloxicam]     Blood pressure goes up  . Tramadol     Blood pressure goes up     Medications: Patient's Medications  New Prescriptions   No medications on file  Previous Medications   ACETAMINOPHEN (TYLENOL) 500 MG TABLET    Take 500 mg by mouth every 6 (six) hours as needed (for pain).   ALBUTEROL (PROVENTIL) (2.5 MG/3ML) 0.083% NEBULIZER SOLUTION    Take 3 mLs (2.5 mg total) by nebulization 2 (two) times daily.   ASPIRIN 325 MG EC TABLET    Take 325 mg by mouth once a week. On Tuesdays    BENZONATATE (TESSALON PERLES) 100 MG CAPSULE    Take by mouth. One every 8 hours as needed for cough   CALCIUM CARB-CHOLECALCIFEROL (CALCIUM 500 +D) 500-400 MG-UNIT TABS    Take 1 tablet by mouth daily.    CARVEDILOL (COREG) 3.125 MG TABLET    Take 1 tablet (3.125 mg total) by mouth 2 (two) times daily with a meal.   CHOLECALCIFEROL (VITAMIN D) 1000 UNITS TABLET    Take 2,000 Units by mouth daily.   FUROSEMIDE (LASIX) 80 MG TABLET    Take 80 mg by mouth as directed. 80 mg in the morning   LEVOTHYROXINE (SYNTHROID, LEVOTHROID) 25 MCG TABLET    Take 1 tablet by mouth daily.   LOPERAMIDE (IMODIUM) 2 MG CAPSULE    Take by mouth. Take 2 tablets for each loose stool, max 6 in 24 hour   LOSARTAN (COZAAR) 25 MG TABLET     Take 25 mg by mouth daily.   MELATONIN 10 MG TABS    Take 10 mg by mouth daily. One at bedtime   NUTRITIONAL SUPPLEMENTS (BOOST BREEZE PO)    Take by mouth. One can twice daily   POTASSIUM CHLORIDE SA (K-DUR,KLOR-CON) 20 MEQ TABLET    Take 40 mEq by mouth daily.   SERTRALINE (ZOLOFT) 25 MG TABLET    Take 25 mg by mouth. Take 1 1/2 tablets (75 mg) once daily  Modified Medications   No medications on file  Discontinued Medications   No medications on  file     Physical Exam: Filed Vitals:   01/18/15 1311  BP: 100/62  Pulse: 82  Temp: 97.5 F (36.4 C)  TempSrc: Oral  Weight: 101 lb (45.813 kg)  SpO2: 95%   Body mass index is 21.85 kg/(m^2). Physical Exam  Constitutional:  Frail white female seated in wheelchair  HENT:  Mouth/Throat: Oropharynx is clear and moist.  No tenderness over left maxillary sinus  Cardiovascular: Normal rate, regular rhythm, normal heart sounds and intact distal pulses.   Pulmonary/Chest: Effort normal.  diminished breath sounds at bases  Abdominal: Soft. Bowel sounds are normal.  Musculoskeletal: Normal range of motion.  Skin: Skin is warm and dry.  multiple ecchymoses     Labs reviewed: Basic Metabolic Panel:  Recent Labs  01/27/14  08/09/14 1245 10/20/14 11/04/14 1433 12/01/14 12/14/14 1238  NA 123*  < > 138 135* 132*  --  142  K 4.3  < > 3.6 3.5 4.2  --  4.1  CL  --   --  102  --  95*  --  103  CO2  --   --  31  --  31  --  30  GLUCOSE  --   --  107*  --  186*  --  87  BUN 10  < > 24* 15 17  --  26*  CREATININE 0.4*  < > 0.96 0.6 0.86  --  1.02  CALCIUM  --   --  9.2  --  9.9  --  10.5  TSH 3.76  --   --  11.38*  --  2.73  --   < > = values in this interval not displayed. Liver Function Tests:  Recent Labs  01/27/14 10/20/14  AST 25 19  ALT 17 10  ALKPHOS 50 37   No results for input(s): LIPASE, AMYLASE in the last 8760 hours. No results for input(s): AMMONIA in the last 8760 hours. CBC:  Recent Labs  07/26/14 10/20/14  12/14/14 1238  WBC 7.7 7.9 7.5  NEUTROABS  --   --  5.4  HGB 13.2 11.5* 11.8*  HCT 40 34* 35.0*  MCV  --   --  86.4  PLT 205 237 234.0   Lipid Panel:  Recent Labs  01/27/14  CHOL 178  HDL 93*  LDLCALC 74  TRIG 41   Lab Results  Component Value Date   HGBA1C 5.1 01/27/2014    Procedures since last appt: 01/13/15:  CXR reviewed as in hpi  Patient Care Team: [name], DO as PCP - General (Geriatric Medicine) [name], MD (Orthopedic Surgery) [name], MD (Ophthalmology) [facility] [name], MD as Consulting Physician (Cardiology)  Assessment/Plan 1. Cough -suspect this is due to a mix of some sinus inflammation and congestion AND her pleural effusion -tx symptoms with inhaler, tessalon perles and monitor  2. Acute maxillary sinusitis, recurrence not specified -afebrile, suspect allergic or viral so will monitor and tx cough as above  3. Lung mass -persistent on xray -now has effusion, but pt and her friend have opted for hospice and she doesn't want a thoracentesis at this time -cont comfort measures  4. Pleural effusion - does not seem chf-related, but more likely malignant -will monitor and tx symptoms, keep comfortable  5. FTT (failure to thrive) in adult -is doing a little better with po intake lately, but still bringing a lot of her meal back to her room (then never eats it)  6. Chronic  combined systolic and diastolic CHF (congestive heart failure) - seems this is stable at present -cont lasix 80mg  q am (could not go w/o this), losartan, and coreg  7. Dementia, without behavioral disturbance -seems this is worsening gradually as she declines overall with this lung mass -not treating her with memory meds at this stage -did treat depression with some benefit  Labs/tests ordered: TSH  Next appt:  Keep sept appt  Keali Mccraw L. Roshun Klingensmith, D.O. [facility] [address], [place] [address] Cell Phone (Mon-Fri 8am-5pm):  [phone] On Call:  [phone] & follow prompts after 5pm & weekends Office Phone:  [phone] Office Fax:  [phone]

## 2015-01-19 DIAGNOSIS — I349 Nonrheumatic mitral valve disorder, unspecified: Secondary | ICD-10-CM | POA: Diagnosis not present

## 2015-01-19 DIAGNOSIS — I10 Essential (primary) hypertension: Secondary | ICD-10-CM | POA: Diagnosis not present

## 2015-01-19 DIAGNOSIS — I509 Heart failure, unspecified: Secondary | ICD-10-CM | POA: Diagnosis not present

## 2015-01-19 DIAGNOSIS — N189 Chronic kidney disease, unspecified: Secondary | ICD-10-CM | POA: Diagnosis not present

## 2015-01-19 DIAGNOSIS — F0150 Vascular dementia without behavioral disturbance: Secondary | ICD-10-CM | POA: Diagnosis not present

## 2015-01-19 DIAGNOSIS — E785 Hyperlipidemia, unspecified: Secondary | ICD-10-CM | POA: Diagnosis not present

## 2015-01-22 DIAGNOSIS — N189 Chronic kidney disease, unspecified: Secondary | ICD-10-CM | POA: Diagnosis not present

## 2015-01-22 DIAGNOSIS — I10 Essential (primary) hypertension: Secondary | ICD-10-CM | POA: Diagnosis not present

## 2015-01-22 DIAGNOSIS — I349 Nonrheumatic mitral valve disorder, unspecified: Secondary | ICD-10-CM | POA: Diagnosis not present

## 2015-01-22 DIAGNOSIS — I509 Heart failure, unspecified: Secondary | ICD-10-CM | POA: Diagnosis not present

## 2015-01-22 DIAGNOSIS — E785 Hyperlipidemia, unspecified: Secondary | ICD-10-CM | POA: Diagnosis not present

## 2015-01-22 DIAGNOSIS — F0150 Vascular dementia without behavioral disturbance: Secondary | ICD-10-CM | POA: Diagnosis not present

## 2015-01-26 DIAGNOSIS — E785 Hyperlipidemia, unspecified: Secondary | ICD-10-CM | POA: Diagnosis not present

## 2015-01-26 DIAGNOSIS — N189 Chronic kidney disease, unspecified: Secondary | ICD-10-CM | POA: Diagnosis not present

## 2015-01-26 DIAGNOSIS — I509 Heart failure, unspecified: Secondary | ICD-10-CM | POA: Diagnosis not present

## 2015-01-26 DIAGNOSIS — F0150 Vascular dementia without behavioral disturbance: Secondary | ICD-10-CM | POA: Diagnosis not present

## 2015-01-26 DIAGNOSIS — I10 Essential (primary) hypertension: Secondary | ICD-10-CM | POA: Diagnosis not present

## 2015-01-26 DIAGNOSIS — I349 Nonrheumatic mitral valve disorder, unspecified: Secondary | ICD-10-CM | POA: Diagnosis not present

## 2015-01-27 DIAGNOSIS — I509 Heart failure, unspecified: Secondary | ICD-10-CM | POA: Diagnosis not present

## 2015-01-27 DIAGNOSIS — E785 Hyperlipidemia, unspecified: Secondary | ICD-10-CM | POA: Diagnosis not present

## 2015-01-27 DIAGNOSIS — I349 Nonrheumatic mitral valve disorder, unspecified: Secondary | ICD-10-CM | POA: Diagnosis not present

## 2015-01-27 DIAGNOSIS — F0150 Vascular dementia without behavioral disturbance: Secondary | ICD-10-CM | POA: Diagnosis not present

## 2015-01-27 DIAGNOSIS — N189 Chronic kidney disease, unspecified: Secondary | ICD-10-CM | POA: Diagnosis not present

## 2015-01-27 DIAGNOSIS — I10 Essential (primary) hypertension: Secondary | ICD-10-CM | POA: Diagnosis not present

## 2015-01-31 DIAGNOSIS — I509 Heart failure, unspecified: Secondary | ICD-10-CM | POA: Diagnosis not present

## 2015-01-31 DIAGNOSIS — F0150 Vascular dementia without behavioral disturbance: Secondary | ICD-10-CM | POA: Diagnosis not present

## 2015-01-31 DIAGNOSIS — I349 Nonrheumatic mitral valve disorder, unspecified: Secondary | ICD-10-CM | POA: Diagnosis not present

## 2015-01-31 DIAGNOSIS — N189 Chronic kidney disease, unspecified: Secondary | ICD-10-CM | POA: Diagnosis not present

## 2015-01-31 DIAGNOSIS — I10 Essential (primary) hypertension: Secondary | ICD-10-CM | POA: Diagnosis not present

## 2015-01-31 DIAGNOSIS — E785 Hyperlipidemia, unspecified: Secondary | ICD-10-CM | POA: Diagnosis not present

## 2015-02-03 DIAGNOSIS — F0150 Vascular dementia without behavioral disturbance: Secondary | ICD-10-CM | POA: Diagnosis not present

## 2015-02-03 DIAGNOSIS — E785 Hyperlipidemia, unspecified: Secondary | ICD-10-CM | POA: Diagnosis not present

## 2015-02-03 DIAGNOSIS — I10 Essential (primary) hypertension: Secondary | ICD-10-CM | POA: Diagnosis not present

## 2015-02-03 DIAGNOSIS — I509 Heart failure, unspecified: Secondary | ICD-10-CM | POA: Diagnosis not present

## 2015-02-03 DIAGNOSIS — N189 Chronic kidney disease, unspecified: Secondary | ICD-10-CM | POA: Diagnosis not present

## 2015-02-03 DIAGNOSIS — I349 Nonrheumatic mitral valve disorder, unspecified: Secondary | ICD-10-CM | POA: Diagnosis not present

## 2015-02-04 DIAGNOSIS — N189 Chronic kidney disease, unspecified: Secondary | ICD-10-CM | POA: Diagnosis not present

## 2015-02-04 DIAGNOSIS — I349 Nonrheumatic mitral valve disorder, unspecified: Secondary | ICD-10-CM | POA: Diagnosis not present

## 2015-02-04 DIAGNOSIS — E785 Hyperlipidemia, unspecified: Secondary | ICD-10-CM | POA: Diagnosis not present

## 2015-02-04 DIAGNOSIS — I509 Heart failure, unspecified: Secondary | ICD-10-CM | POA: Diagnosis not present

## 2015-02-04 DIAGNOSIS — F0150 Vascular dementia without behavioral disturbance: Secondary | ICD-10-CM | POA: Diagnosis not present

## 2015-02-04 DIAGNOSIS — I10 Essential (primary) hypertension: Secondary | ICD-10-CM | POA: Diagnosis not present

## 2015-02-09 DIAGNOSIS — F0150 Vascular dementia without behavioral disturbance: Secondary | ICD-10-CM | POA: Diagnosis not present

## 2015-02-09 DIAGNOSIS — F339 Major depressive disorder, recurrent, unspecified: Secondary | ICD-10-CM | POA: Diagnosis not present

## 2015-02-09 DIAGNOSIS — E785 Hyperlipidemia, unspecified: Secondary | ICD-10-CM | POA: Diagnosis not present

## 2015-02-09 DIAGNOSIS — I349 Nonrheumatic mitral valve disorder, unspecified: Secondary | ICD-10-CM | POA: Diagnosis not present

## 2015-02-09 DIAGNOSIS — I509 Heart failure, unspecified: Secondary | ICD-10-CM | POA: Diagnosis not present

## 2015-02-09 DIAGNOSIS — R634 Abnormal weight loss: Secondary | ICD-10-CM | POA: Diagnosis not present

## 2015-02-09 DIAGNOSIS — C50919 Malignant neoplasm of unspecified site of unspecified female breast: Secondary | ICD-10-CM | POA: Diagnosis not present

## 2015-02-09 DIAGNOSIS — E039 Hypothyroidism, unspecified: Secondary | ICD-10-CM | POA: Diagnosis not present

## 2015-02-09 DIAGNOSIS — R630 Anorexia: Secondary | ICD-10-CM | POA: Diagnosis not present

## 2015-02-09 DIAGNOSIS — I10 Essential (primary) hypertension: Secondary | ICD-10-CM | POA: Diagnosis not present

## 2015-02-09 DIAGNOSIS — N189 Chronic kidney disease, unspecified: Secondary | ICD-10-CM | POA: Diagnosis not present

## 2015-02-10 DIAGNOSIS — I10 Essential (primary) hypertension: Secondary | ICD-10-CM | POA: Diagnosis not present

## 2015-02-10 DIAGNOSIS — F0150 Vascular dementia without behavioral disturbance: Secondary | ICD-10-CM | POA: Diagnosis not present

## 2015-02-10 DIAGNOSIS — E785 Hyperlipidemia, unspecified: Secondary | ICD-10-CM | POA: Diagnosis not present

## 2015-02-10 DIAGNOSIS — I349 Nonrheumatic mitral valve disorder, unspecified: Secondary | ICD-10-CM | POA: Diagnosis not present

## 2015-02-10 DIAGNOSIS — I509 Heart failure, unspecified: Secondary | ICD-10-CM | POA: Diagnosis not present

## 2015-02-10 DIAGNOSIS — N189 Chronic kidney disease, unspecified: Secondary | ICD-10-CM | POA: Diagnosis not present

## 2015-02-15 DIAGNOSIS — I349 Nonrheumatic mitral valve disorder, unspecified: Secondary | ICD-10-CM | POA: Diagnosis not present

## 2015-02-15 DIAGNOSIS — E785 Hyperlipidemia, unspecified: Secondary | ICD-10-CM | POA: Diagnosis not present

## 2015-02-15 DIAGNOSIS — I10 Essential (primary) hypertension: Secondary | ICD-10-CM | POA: Diagnosis not present

## 2015-02-15 DIAGNOSIS — I509 Heart failure, unspecified: Secondary | ICD-10-CM | POA: Diagnosis not present

## 2015-02-15 DIAGNOSIS — N189 Chronic kidney disease, unspecified: Secondary | ICD-10-CM | POA: Diagnosis not present

## 2015-02-15 DIAGNOSIS — F0150 Vascular dementia without behavioral disturbance: Secondary | ICD-10-CM | POA: Diagnosis not present

## 2015-02-16 DIAGNOSIS — N189 Chronic kidney disease, unspecified: Secondary | ICD-10-CM | POA: Diagnosis not present

## 2015-02-16 DIAGNOSIS — I10 Essential (primary) hypertension: Secondary | ICD-10-CM | POA: Diagnosis not present

## 2015-02-16 DIAGNOSIS — I509 Heart failure, unspecified: Secondary | ICD-10-CM | POA: Diagnosis not present

## 2015-02-16 DIAGNOSIS — E785 Hyperlipidemia, unspecified: Secondary | ICD-10-CM | POA: Diagnosis not present

## 2015-02-16 DIAGNOSIS — I349 Nonrheumatic mitral valve disorder, unspecified: Secondary | ICD-10-CM | POA: Diagnosis not present

## 2015-02-16 DIAGNOSIS — F0150 Vascular dementia without behavioral disturbance: Secondary | ICD-10-CM | POA: Diagnosis not present

## 2015-02-17 DIAGNOSIS — I349 Nonrheumatic mitral valve disorder, unspecified: Secondary | ICD-10-CM | POA: Diagnosis not present

## 2015-02-17 DIAGNOSIS — F0150 Vascular dementia without behavioral disturbance: Secondary | ICD-10-CM | POA: Diagnosis not present

## 2015-02-17 DIAGNOSIS — I10 Essential (primary) hypertension: Secondary | ICD-10-CM | POA: Diagnosis not present

## 2015-02-17 DIAGNOSIS — I509 Heart failure, unspecified: Secondary | ICD-10-CM | POA: Diagnosis not present

## 2015-02-17 DIAGNOSIS — E785 Hyperlipidemia, unspecified: Secondary | ICD-10-CM | POA: Diagnosis not present

## 2015-02-17 DIAGNOSIS — N189 Chronic kidney disease, unspecified: Secondary | ICD-10-CM | POA: Diagnosis not present

## 2015-02-19 DIAGNOSIS — F0150 Vascular dementia without behavioral disturbance: Secondary | ICD-10-CM | POA: Diagnosis not present

## 2015-02-19 DIAGNOSIS — N189 Chronic kidney disease, unspecified: Secondary | ICD-10-CM | POA: Diagnosis not present

## 2015-02-19 DIAGNOSIS — I349 Nonrheumatic mitral valve disorder, unspecified: Secondary | ICD-10-CM | POA: Diagnosis not present

## 2015-02-19 DIAGNOSIS — I509 Heart failure, unspecified: Secondary | ICD-10-CM | POA: Diagnosis not present

## 2015-02-19 DIAGNOSIS — E785 Hyperlipidemia, unspecified: Secondary | ICD-10-CM | POA: Diagnosis not present

## 2015-02-19 DIAGNOSIS — I10 Essential (primary) hypertension: Secondary | ICD-10-CM | POA: Diagnosis not present

## 2015-02-21 DIAGNOSIS — E785 Hyperlipidemia, unspecified: Secondary | ICD-10-CM | POA: Diagnosis not present

## 2015-02-21 DIAGNOSIS — I10 Essential (primary) hypertension: Secondary | ICD-10-CM | POA: Diagnosis not present

## 2015-02-21 DIAGNOSIS — I509 Heart failure, unspecified: Secondary | ICD-10-CM | POA: Diagnosis not present

## 2015-02-21 DIAGNOSIS — F0150 Vascular dementia without behavioral disturbance: Secondary | ICD-10-CM | POA: Diagnosis not present

## 2015-02-21 DIAGNOSIS — I349 Nonrheumatic mitral valve disorder, unspecified: Secondary | ICD-10-CM | POA: Diagnosis not present

## 2015-02-21 DIAGNOSIS — N189 Chronic kidney disease, unspecified: Secondary | ICD-10-CM | POA: Diagnosis not present

## 2015-02-23 DIAGNOSIS — N189 Chronic kidney disease, unspecified: Secondary | ICD-10-CM | POA: Diagnosis not present

## 2015-02-23 DIAGNOSIS — I509 Heart failure, unspecified: Secondary | ICD-10-CM | POA: Diagnosis not present

## 2015-02-23 DIAGNOSIS — I349 Nonrheumatic mitral valve disorder, unspecified: Secondary | ICD-10-CM | POA: Diagnosis not present

## 2015-02-23 DIAGNOSIS — I10 Essential (primary) hypertension: Secondary | ICD-10-CM | POA: Diagnosis not present

## 2015-02-23 DIAGNOSIS — F0150 Vascular dementia without behavioral disturbance: Secondary | ICD-10-CM | POA: Diagnosis not present

## 2015-02-23 DIAGNOSIS — E785 Hyperlipidemia, unspecified: Secondary | ICD-10-CM | POA: Diagnosis not present

## 2015-02-24 DIAGNOSIS — I509 Heart failure, unspecified: Secondary | ICD-10-CM | POA: Diagnosis not present

## 2015-02-24 DIAGNOSIS — E785 Hyperlipidemia, unspecified: Secondary | ICD-10-CM | POA: Diagnosis not present

## 2015-02-24 DIAGNOSIS — I10 Essential (primary) hypertension: Secondary | ICD-10-CM | POA: Diagnosis not present

## 2015-02-24 DIAGNOSIS — N189 Chronic kidney disease, unspecified: Secondary | ICD-10-CM | POA: Diagnosis not present

## 2015-02-24 DIAGNOSIS — F0150 Vascular dementia without behavioral disturbance: Secondary | ICD-10-CM | POA: Diagnosis not present

## 2015-02-24 DIAGNOSIS — I349 Nonrheumatic mitral valve disorder, unspecified: Secondary | ICD-10-CM | POA: Diagnosis not present

## 2015-02-27 DIAGNOSIS — I349 Nonrheumatic mitral valve disorder, unspecified: Secondary | ICD-10-CM | POA: Diagnosis not present

## 2015-02-27 DIAGNOSIS — I509 Heart failure, unspecified: Secondary | ICD-10-CM | POA: Diagnosis not present

## 2015-02-27 DIAGNOSIS — I10 Essential (primary) hypertension: Secondary | ICD-10-CM | POA: Diagnosis not present

## 2015-02-27 DIAGNOSIS — F0150 Vascular dementia without behavioral disturbance: Secondary | ICD-10-CM | POA: Diagnosis not present

## 2015-02-27 DIAGNOSIS — N189 Chronic kidney disease, unspecified: Secondary | ICD-10-CM | POA: Diagnosis not present

## 2015-02-27 DIAGNOSIS — E785 Hyperlipidemia, unspecified: Secondary | ICD-10-CM | POA: Diagnosis not present

## 2015-03-01 ENCOUNTER — Non-Acute Institutional Stay: Payer: Medicare Other | Admitting: Internal Medicine

## 2015-03-01 ENCOUNTER — Encounter: Payer: Self-pay | Admitting: Internal Medicine

## 2015-03-01 VITALS — BP 100/62 | HR 76 | Temp 97.7°F | Wt 100.0 lb

## 2015-03-01 DIAGNOSIS — I5022 Chronic systolic (congestive) heart failure: Secondary | ICD-10-CM | POA: Diagnosis not present

## 2015-03-01 DIAGNOSIS — R918 Other nonspecific abnormal finding of lung field: Secondary | ICD-10-CM

## 2015-03-01 DIAGNOSIS — R413 Other amnesia: Secondary | ICD-10-CM

## 2015-03-01 DIAGNOSIS — F0390 Unspecified dementia without behavioral disturbance: Secondary | ICD-10-CM | POA: Diagnosis not present

## 2015-03-01 DIAGNOSIS — I10 Essential (primary) hypertension: Secondary | ICD-10-CM

## 2015-03-01 DIAGNOSIS — M810 Age-related osteoporosis without current pathological fracture: Secondary | ICD-10-CM | POA: Diagnosis not present

## 2015-03-01 DIAGNOSIS — R627 Adult failure to thrive: Secondary | ICD-10-CM | POA: Diagnosis not present

## 2015-03-01 NOTE — Progress Notes (Signed)
Location:  [facility]  Code Status: DNR Goals of Care: Advanced Directive information Does patient have an advance directive?: Yes, Type of Advance Directive: [place];Living will;Out of facility DNR (pink MOST or yellow form), Pre-existing out of facility DNR order (yellow form or pink MOST form): Yellow form placed in chart (order not valid for inpatient use)   Chief Complaint  Patient presents with  . Medical Management of Chronic Issues    CHF, dementia, lung mass, pleural effusion. Here with friend BJ     HPI: Patient is a 81 y.o. seen in the office today for medical management of chronic illnesses. She is accompanied by her POA, BJ.   States she is doing well today. She feels she is stable and hasn't declined.   Cough has resolved. Her breathing is ok. She isn't very active, denies any shortness of breath with exertion or at rest.    She has had some nasal congestion for a few weeks per BJ. BJ inquires if its allergies. Patient states it doesn't bother her and she is sleeping well. Denies itchy eyes, nasal congestion or drainage, sinus pain.  She doesn't have very much of an appetite. Her weight is stable.   BJ also notes that she received a letter requesting consent for the influenza vaccine. BJ feels patient is competent to make this decision for herself, and Ms. Passey states she does not want the vaccine.  BJ notes her confusion is worse. Her short term memory is also worsening.   Review of Systems:  Review of Systems  Constitutional: Negative for fever, chills, weight loss and malaise/fatigue.  HENT: Positive for congestion.   Respiratory: Negative for cough and shortness of breath.   Cardiovascular: Negative for chest pain, orthopnea and leg swelling.  Gastrointestinal: Negative for diarrhea and constipation.  Neurological: Negative for headaches.    Past Medical History  Diagnosis Date  . Varicose  veins   . Hypothyroidism   . Vitamin D deficiency   . Hyperlipidemia   . Hypertension   . Shoulder pain, right 10/2009    X- rayed at Dr. Berenice Primas.Severe Degenerative Disease. Recommended an artificial shoulder. Saw Dr. Tamera Punt .  Marland Kitchen Right shoulder injury 12/2009  . Leg pain   . Hyperglycemia   . Macular degeneration   . Keratosis seborrheica   . Left knee pain   . Osteoporosis   . Neoplasm of breast     right breast cancer  . Fall 10-09-2011    fell outdoors on driveway fell and hit right side of body and face  . Pain in joint, pelvic region and thigh 05/11/2012  . Pain in joint, site unspecified 02/03/39  . Phlebitis and thrombophlebitis of other deep vessels of lower extremities 01/06/2012  . Other malaise and fatigue 01/06/2012  . Closed fracture of two ribs 10/21/2011, 08/24/2013  . Other specified circulatory system disorders 10/09/2011  . Open wound of knee, leg (except thigh), and ankle, without mention of complication 2/95/2841  . Closed fracture of unspecified part of upper end of humerus 01/13/2005  . Female stress incontinence 02/10/2004  . Herpes zoster without mention of complication 32/44/0102  . Malignant neoplasm of breast (female), unspecified site   . Varicose vein 2014  . Multiple fractures of ribs of left side 08/24/2013  . Gait disorder 08/25/2013    Poor balance     Past Surgical History  Procedure Laterality Date  . Left cataract  1999  .  Right mastectomy  1989  . Vesicovaginal fistula closure w/ tah  1967  . Colon polyps  1989, 1992, 1995, 1997  . Cataract extraction  04/2004    Dr. Bing Plume  . Mole removal  03/04/2009    on back by Dr. Derrel Nip  . Endovenous ablation saphenous vein w/ laser  01-30-2012    left greater saphenous vein and sclerotherapy left leg  . Abdominal hysterectomy  1967    Allergies  Allergen Reactions  . Levaquin [Levofloxacin In D5w] Diarrhea  . Mobic [Meloxicam]     Blood pressure goes up  . Tramadol     Blood pressure goes up     Medications: Patient's Medications  New Prescriptions   No medications on file  Previous Medications   ACETAMINOPHEN (TYLENOL) 500 MG TABLET    Take 500 mg by mouth every 6 (six) hours as needed (for pain).   ALBUTEROL (PROVENTIL) (2.5 MG/3ML) 0.083% NEBULIZER SOLUTION    Take 3 mLs (2.5 mg total) by nebulization 2 (two) times daily.   ASPIRIN 325 MG EC TABLET    Take 325 mg by mouth once a week. On Tuesdays    BENZONATATE (TESSALON PERLES) 100 MG CAPSULE    Take by mouth. One every 8 hours as needed for cough. One at bedtime   CALCIUM CARB-CHOLECALCIFEROL (CALCIUM 500 +D) 500-400 MG-UNIT TABS    Take 1 tablet by mouth daily.    CARVEDILOL (COREG) 3.125 MG TABLET    Take 1 tablet (3.125 mg total) by mouth 2 (two) times daily with a meal.   CHOLECALCIFEROL (VITAMIN D) 1000 UNITS TABLET    Take 2,000 Units by mouth daily.   FUROSEMIDE (LASIX) 80 MG TABLET    Take 80 mg by mouth as directed. 80 mg in the morning   LEVOTHYROXINE (SYNTHROID, LEVOTHROID) 25 MCG TABLET    Take 1 tablet by mouth daily.   LOPERAMIDE (IMODIUM) 2 MG CAPSULE    Take by mouth. Take 2 tablets for each loose stool, max 6 in 24 hour   LOSARTAN (COZAAR) 25 MG TABLET    Take 25 mg by mouth daily.   MELATONIN 10 MG TABS    Take 10 mg by mouth daily. One at bedtime   NUTRITIONAL SUPPLEMENTS (BOOST BREEZE PO)    Take by mouth. One can twice daily   POTASSIUM CHLORIDE SA (K-DUR,KLOR-CON) 20 MEQ TABLET    Take 40 mEq by mouth daily.   SERTRALINE (ZOLOFT) 25 MG TABLET    Take 25 mg by mouth. Take 1 1/2 tablets (75 mg) once daily  Modified Medications   No medications on file  Discontinued Medications   No medications on file    Physical Exam: Filed Vitals:   03/01/15 1555  BP: 100/62  Pulse: 76  Temp: 97.7 F (36.5 C)  TempSrc: Oral  Weight: 100 lb (45.36 kg)  SpO2: 93%   Physical Exam  Constitutional:  frail  HENT:  Mouth/Throat: Oropharynx is clear and moist. No oropharyngeal exudate.  Eyes: Conjunctivae are  normal.  Cardiovascular: Normal rate and regular rhythm.   Murmur heard. No lower extremity edema  Pulmonary/Chest: Effort normal and breath sounds normal.  Abdominal: Soft. Bowel sounds are normal.  Musculoskeletal:  Uses walker and wheelchair  Neurological: She is alert.  Skin: Skin is warm and dry.  Psychiatric: She has a normal mood and affect.    Labs reviewed: Basic Metabolic Panel:  Recent Labs  08/09/14 1245 10/20/14 11/04/14 1433 12/01/14 12/14/14 1238  NA  138 135* 132*  --  142  K 3.6 3.5 4.2  --  4.1  CL 102  --  95*  --  103  CO2 31  --  31  --  30  GLUCOSE 107*  --  186*  --  87  BUN 24* 15 17  --  26*  CREATININE 0.96 0.6 0.86  --  1.02  CALCIUM 9.2  --  9.9  --  10.5  TSH  --  11.38*  --  2.73  --    Liver Function Tests:  Recent Labs  10/20/14  AST 19  ALT 10  ALKPHOS 37   No results for input(s): LIPASE, AMYLASE in the last 8760 hours. No results for input(s): AMMONIA in the last 8760 hours. CBC:  Recent Labs  07/26/14 10/20/14 12/14/14 1238  WBC 7.7 7.9 7.5  NEUTROABS  --   --  5.4  HGB 13.2 11.5* 11.8*  HCT 40 34* 35.0*  MCV  --   --  86.4  PLT 205 237 234.0   Lipid Panel: No results for input(s): CHOL, HDL, LDLCALC, TRIG, CHOLHDL, LDLDIRECT in the last 8760 hours. Lab Results  Component Value Date   HGBA1C 5.1 01/27/2014   Assessment/Plan  1. Chronic systolic congestive heart failure- -Stable, not currently symptomatic -Continue current medication regimen -Monitor for increasing shortness of breath or other symptoms -Patient is also being followed by Hospice  -Goal of treatment is comfort measures only  2. Essential hypertension -Stable -Continue current medications  3. Senile osteoporosis -Continue supplementation with calcium and vitamin D  4. Memory loss -Appears to be worsening--due to dementia -No indications for medication intervention at this time due to goals of care  5. FTT (failure to thrive) in  adult -Stable -Appetite is low, however weight is stable right around 100 for a few months  6. Dementia, without behavioral disturbance -Worsening, is on hospice care -Continue to monitor for complications  7. Lung mass -Unknown etiology -Patient declines further investigation or intervention -Breathing is stable and not interfering with quality of life -Patient is being followed by Hospice -Treatment goal is comfort   Labs/tests ordered: none Next appt: Dr. Mariea Clonts will visit her in her room in assisted living in 6 weeks.   Mariana Kaufman, RN, BSN [employer] [facility]- Nurse Practitioner- [facility] [address], [place] [address] Office Phone:  [phone] Office Fax:  [phone]

## 2015-03-02 DIAGNOSIS — F0150 Vascular dementia without behavioral disturbance: Secondary | ICD-10-CM | POA: Diagnosis not present

## 2015-03-02 DIAGNOSIS — N189 Chronic kidney disease, unspecified: Secondary | ICD-10-CM | POA: Diagnosis not present

## 2015-03-02 DIAGNOSIS — I349 Nonrheumatic mitral valve disorder, unspecified: Secondary | ICD-10-CM | POA: Diagnosis not present

## 2015-03-02 DIAGNOSIS — I509 Heart failure, unspecified: Secondary | ICD-10-CM | POA: Diagnosis not present

## 2015-03-02 DIAGNOSIS — I10 Essential (primary) hypertension: Secondary | ICD-10-CM | POA: Diagnosis not present

## 2015-03-02 DIAGNOSIS — E785 Hyperlipidemia, unspecified: Secondary | ICD-10-CM | POA: Diagnosis not present

## 2015-03-05 DIAGNOSIS — I10 Essential (primary) hypertension: Secondary | ICD-10-CM | POA: Diagnosis not present

## 2015-03-05 DIAGNOSIS — I349 Nonrheumatic mitral valve disorder, unspecified: Secondary | ICD-10-CM | POA: Diagnosis not present

## 2015-03-05 DIAGNOSIS — E785 Hyperlipidemia, unspecified: Secondary | ICD-10-CM | POA: Diagnosis not present

## 2015-03-05 DIAGNOSIS — I509 Heart failure, unspecified: Secondary | ICD-10-CM | POA: Diagnosis not present

## 2015-03-05 DIAGNOSIS — N189 Chronic kidney disease, unspecified: Secondary | ICD-10-CM | POA: Diagnosis not present

## 2015-03-05 DIAGNOSIS — F0150 Vascular dementia without behavioral disturbance: Secondary | ICD-10-CM | POA: Diagnosis not present

## 2015-03-06 DIAGNOSIS — I509 Heart failure, unspecified: Secondary | ICD-10-CM | POA: Diagnosis not present

## 2015-03-06 DIAGNOSIS — F0150 Vascular dementia without behavioral disturbance: Secondary | ICD-10-CM | POA: Diagnosis not present

## 2015-03-06 DIAGNOSIS — I10 Essential (primary) hypertension: Secondary | ICD-10-CM | POA: Diagnosis not present

## 2015-03-06 DIAGNOSIS — E785 Hyperlipidemia, unspecified: Secondary | ICD-10-CM | POA: Diagnosis not present

## 2015-03-06 DIAGNOSIS — I349 Nonrheumatic mitral valve disorder, unspecified: Secondary | ICD-10-CM | POA: Diagnosis not present

## 2015-03-06 DIAGNOSIS — N189 Chronic kidney disease, unspecified: Secondary | ICD-10-CM | POA: Diagnosis not present

## 2015-03-09 DIAGNOSIS — E785 Hyperlipidemia, unspecified: Secondary | ICD-10-CM | POA: Diagnosis not present

## 2015-03-09 DIAGNOSIS — I349 Nonrheumatic mitral valve disorder, unspecified: Secondary | ICD-10-CM | POA: Diagnosis not present

## 2015-03-09 DIAGNOSIS — N189 Chronic kidney disease, unspecified: Secondary | ICD-10-CM | POA: Diagnosis not present

## 2015-03-09 DIAGNOSIS — I509 Heart failure, unspecified: Secondary | ICD-10-CM | POA: Diagnosis not present

## 2015-03-09 DIAGNOSIS — F0150 Vascular dementia without behavioral disturbance: Secondary | ICD-10-CM | POA: Diagnosis not present

## 2015-03-09 DIAGNOSIS — I10 Essential (primary) hypertension: Secondary | ICD-10-CM | POA: Diagnosis not present

## 2015-03-11 DIAGNOSIS — F339 Major depressive disorder, recurrent, unspecified: Secondary | ICD-10-CM | POA: Diagnosis not present

## 2015-03-11 DIAGNOSIS — I10 Essential (primary) hypertension: Secondary | ICD-10-CM | POA: Diagnosis not present

## 2015-03-11 DIAGNOSIS — C50919 Malignant neoplasm of unspecified site of unspecified female breast: Secondary | ICD-10-CM | POA: Diagnosis not present

## 2015-03-11 DIAGNOSIS — R634 Abnormal weight loss: Secondary | ICD-10-CM | POA: Diagnosis not present

## 2015-03-11 DIAGNOSIS — I349 Nonrheumatic mitral valve disorder, unspecified: Secondary | ICD-10-CM | POA: Diagnosis not present

## 2015-03-11 DIAGNOSIS — R630 Anorexia: Secondary | ICD-10-CM | POA: Diagnosis not present

## 2015-03-11 DIAGNOSIS — I509 Heart failure, unspecified: Secondary | ICD-10-CM | POA: Diagnosis not present

## 2015-03-11 DIAGNOSIS — E785 Hyperlipidemia, unspecified: Secondary | ICD-10-CM | POA: Diagnosis not present

## 2015-03-11 DIAGNOSIS — N189 Chronic kidney disease, unspecified: Secondary | ICD-10-CM | POA: Diagnosis not present

## 2015-03-11 DIAGNOSIS — F0150 Vascular dementia without behavioral disturbance: Secondary | ICD-10-CM | POA: Diagnosis not present

## 2015-03-11 DIAGNOSIS — E039 Hypothyroidism, unspecified: Secondary | ICD-10-CM | POA: Diagnosis not present

## 2015-03-12 DIAGNOSIS — I10 Essential (primary) hypertension: Secondary | ICD-10-CM | POA: Diagnosis not present

## 2015-03-12 DIAGNOSIS — N189 Chronic kidney disease, unspecified: Secondary | ICD-10-CM | POA: Diagnosis not present

## 2015-03-12 DIAGNOSIS — I509 Heart failure, unspecified: Secondary | ICD-10-CM | POA: Diagnosis not present

## 2015-03-12 DIAGNOSIS — F0150 Vascular dementia without behavioral disturbance: Secondary | ICD-10-CM | POA: Diagnosis not present

## 2015-03-12 DIAGNOSIS — E785 Hyperlipidemia, unspecified: Secondary | ICD-10-CM | POA: Diagnosis not present

## 2015-03-12 DIAGNOSIS — I349 Nonrheumatic mitral valve disorder, unspecified: Secondary | ICD-10-CM | POA: Diagnosis not present

## 2015-03-13 DIAGNOSIS — E785 Hyperlipidemia, unspecified: Secondary | ICD-10-CM | POA: Diagnosis not present

## 2015-03-13 DIAGNOSIS — I349 Nonrheumatic mitral valve disorder, unspecified: Secondary | ICD-10-CM | POA: Diagnosis not present

## 2015-03-13 DIAGNOSIS — N189 Chronic kidney disease, unspecified: Secondary | ICD-10-CM | POA: Diagnosis not present

## 2015-03-13 DIAGNOSIS — I10 Essential (primary) hypertension: Secondary | ICD-10-CM | POA: Diagnosis not present

## 2015-03-13 DIAGNOSIS — F0150 Vascular dementia without behavioral disturbance: Secondary | ICD-10-CM | POA: Diagnosis not present

## 2015-03-13 DIAGNOSIS — I509 Heart failure, unspecified: Secondary | ICD-10-CM | POA: Diagnosis not present

## 2015-03-14 DIAGNOSIS — I349 Nonrheumatic mitral valve disorder, unspecified: Secondary | ICD-10-CM | POA: Diagnosis not present

## 2015-03-14 DIAGNOSIS — N189 Chronic kidney disease, unspecified: Secondary | ICD-10-CM | POA: Diagnosis not present

## 2015-03-14 DIAGNOSIS — I10 Essential (primary) hypertension: Secondary | ICD-10-CM | POA: Diagnosis not present

## 2015-03-14 DIAGNOSIS — I509 Heart failure, unspecified: Secondary | ICD-10-CM | POA: Diagnosis not present

## 2015-03-14 DIAGNOSIS — E785 Hyperlipidemia, unspecified: Secondary | ICD-10-CM | POA: Diagnosis not present

## 2015-03-14 DIAGNOSIS — F0150 Vascular dementia without behavioral disturbance: Secondary | ICD-10-CM | POA: Diagnosis not present

## 2015-03-15 DIAGNOSIS — I349 Nonrheumatic mitral valve disorder, unspecified: Secondary | ICD-10-CM | POA: Diagnosis not present

## 2015-03-15 DIAGNOSIS — I10 Essential (primary) hypertension: Secondary | ICD-10-CM | POA: Diagnosis not present

## 2015-03-15 DIAGNOSIS — E785 Hyperlipidemia, unspecified: Secondary | ICD-10-CM | POA: Diagnosis not present

## 2015-03-15 DIAGNOSIS — I509 Heart failure, unspecified: Secondary | ICD-10-CM | POA: Diagnosis not present

## 2015-03-15 DIAGNOSIS — N189 Chronic kidney disease, unspecified: Secondary | ICD-10-CM | POA: Diagnosis not present

## 2015-03-15 DIAGNOSIS — F0150 Vascular dementia without behavioral disturbance: Secondary | ICD-10-CM | POA: Diagnosis not present

## 2015-03-17 DIAGNOSIS — E785 Hyperlipidemia, unspecified: Secondary | ICD-10-CM | POA: Diagnosis not present

## 2015-03-17 DIAGNOSIS — N189 Chronic kidney disease, unspecified: Secondary | ICD-10-CM | POA: Diagnosis not present

## 2015-03-17 DIAGNOSIS — I10 Essential (primary) hypertension: Secondary | ICD-10-CM | POA: Diagnosis not present

## 2015-03-17 DIAGNOSIS — I509 Heart failure, unspecified: Secondary | ICD-10-CM | POA: Diagnosis not present

## 2015-03-17 DIAGNOSIS — I349 Nonrheumatic mitral valve disorder, unspecified: Secondary | ICD-10-CM | POA: Diagnosis not present

## 2015-03-17 DIAGNOSIS — F0150 Vascular dementia without behavioral disturbance: Secondary | ICD-10-CM | POA: Diagnosis not present

## 2015-03-23 DIAGNOSIS — I10 Essential (primary) hypertension: Secondary | ICD-10-CM | POA: Diagnosis not present

## 2015-03-23 DIAGNOSIS — N189 Chronic kidney disease, unspecified: Secondary | ICD-10-CM | POA: Diagnosis not present

## 2015-03-23 DIAGNOSIS — F0150 Vascular dementia without behavioral disturbance: Secondary | ICD-10-CM | POA: Diagnosis not present

## 2015-03-23 DIAGNOSIS — I509 Heart failure, unspecified: Secondary | ICD-10-CM | POA: Diagnosis not present

## 2015-03-23 DIAGNOSIS — E785 Hyperlipidemia, unspecified: Secondary | ICD-10-CM | POA: Diagnosis not present

## 2015-03-23 DIAGNOSIS — I349 Nonrheumatic mitral valve disorder, unspecified: Secondary | ICD-10-CM | POA: Diagnosis not present

## 2015-03-31 DIAGNOSIS — N189 Chronic kidney disease, unspecified: Secondary | ICD-10-CM | POA: Diagnosis not present

## 2015-03-31 DIAGNOSIS — I349 Nonrheumatic mitral valve disorder, unspecified: Secondary | ICD-10-CM | POA: Diagnosis not present

## 2015-03-31 DIAGNOSIS — I10 Essential (primary) hypertension: Secondary | ICD-10-CM | POA: Diagnosis not present

## 2015-03-31 DIAGNOSIS — E785 Hyperlipidemia, unspecified: Secondary | ICD-10-CM | POA: Diagnosis not present

## 2015-03-31 DIAGNOSIS — I509 Heart failure, unspecified: Secondary | ICD-10-CM | POA: Diagnosis not present

## 2015-03-31 DIAGNOSIS — F0150 Vascular dementia without behavioral disturbance: Secondary | ICD-10-CM | POA: Diagnosis not present

## 2015-04-01 DIAGNOSIS — I349 Nonrheumatic mitral valve disorder, unspecified: Secondary | ICD-10-CM | POA: Diagnosis not present

## 2015-04-01 DIAGNOSIS — F0150 Vascular dementia without behavioral disturbance: Secondary | ICD-10-CM | POA: Diagnosis not present

## 2015-04-01 DIAGNOSIS — E785 Hyperlipidemia, unspecified: Secondary | ICD-10-CM | POA: Diagnosis not present

## 2015-04-01 DIAGNOSIS — N189 Chronic kidney disease, unspecified: Secondary | ICD-10-CM | POA: Diagnosis not present

## 2015-04-01 DIAGNOSIS — I509 Heart failure, unspecified: Secondary | ICD-10-CM | POA: Diagnosis not present

## 2015-04-01 DIAGNOSIS — I10 Essential (primary) hypertension: Secondary | ICD-10-CM | POA: Diagnosis not present

## 2015-04-04 DIAGNOSIS — I509 Heart failure, unspecified: Secondary | ICD-10-CM | POA: Diagnosis not present

## 2015-04-04 DIAGNOSIS — N189 Chronic kidney disease, unspecified: Secondary | ICD-10-CM | POA: Diagnosis not present

## 2015-04-04 DIAGNOSIS — E785 Hyperlipidemia, unspecified: Secondary | ICD-10-CM | POA: Diagnosis not present

## 2015-04-04 DIAGNOSIS — I349 Nonrheumatic mitral valve disorder, unspecified: Secondary | ICD-10-CM | POA: Diagnosis not present

## 2015-04-04 DIAGNOSIS — F0150 Vascular dementia without behavioral disturbance: Secondary | ICD-10-CM | POA: Diagnosis not present

## 2015-04-04 DIAGNOSIS — I10 Essential (primary) hypertension: Secondary | ICD-10-CM | POA: Diagnosis not present

## 2015-04-05 DIAGNOSIS — I10 Essential (primary) hypertension: Secondary | ICD-10-CM | POA: Diagnosis not present

## 2015-04-05 DIAGNOSIS — F0150 Vascular dementia without behavioral disturbance: Secondary | ICD-10-CM | POA: Diagnosis not present

## 2015-04-05 DIAGNOSIS — E785 Hyperlipidemia, unspecified: Secondary | ICD-10-CM | POA: Diagnosis not present

## 2015-04-05 DIAGNOSIS — N189 Chronic kidney disease, unspecified: Secondary | ICD-10-CM | POA: Diagnosis not present

## 2015-04-05 DIAGNOSIS — I509 Heart failure, unspecified: Secondary | ICD-10-CM | POA: Diagnosis not present

## 2015-04-05 DIAGNOSIS — I349 Nonrheumatic mitral valve disorder, unspecified: Secondary | ICD-10-CM | POA: Diagnosis not present

## 2015-04-07 DIAGNOSIS — E785 Hyperlipidemia, unspecified: Secondary | ICD-10-CM | POA: Diagnosis not present

## 2015-04-07 DIAGNOSIS — N189 Chronic kidney disease, unspecified: Secondary | ICD-10-CM | POA: Diagnosis not present

## 2015-04-07 DIAGNOSIS — F0150 Vascular dementia without behavioral disturbance: Secondary | ICD-10-CM | POA: Diagnosis not present

## 2015-04-07 DIAGNOSIS — I10 Essential (primary) hypertension: Secondary | ICD-10-CM | POA: Diagnosis not present

## 2015-04-07 DIAGNOSIS — I509 Heart failure, unspecified: Secondary | ICD-10-CM | POA: Diagnosis not present

## 2015-04-07 DIAGNOSIS — I349 Nonrheumatic mitral valve disorder, unspecified: Secondary | ICD-10-CM | POA: Diagnosis not present

## 2015-04-11 DIAGNOSIS — I10 Essential (primary) hypertension: Secondary | ICD-10-CM | POA: Diagnosis not present

## 2015-04-11 DIAGNOSIS — I349 Nonrheumatic mitral valve disorder, unspecified: Secondary | ICD-10-CM | POA: Diagnosis not present

## 2015-04-11 DIAGNOSIS — R634 Abnormal weight loss: Secondary | ICD-10-CM | POA: Diagnosis not present

## 2015-04-11 DIAGNOSIS — C50919 Malignant neoplasm of unspecified site of unspecified female breast: Secondary | ICD-10-CM | POA: Diagnosis not present

## 2015-04-11 DIAGNOSIS — F339 Major depressive disorder, recurrent, unspecified: Secondary | ICD-10-CM | POA: Diagnosis not present

## 2015-04-11 DIAGNOSIS — N189 Chronic kidney disease, unspecified: Secondary | ICD-10-CM | POA: Diagnosis not present

## 2015-04-11 DIAGNOSIS — E785 Hyperlipidemia, unspecified: Secondary | ICD-10-CM | POA: Diagnosis not present

## 2015-04-11 DIAGNOSIS — R630 Anorexia: Secondary | ICD-10-CM | POA: Diagnosis not present

## 2015-04-11 DIAGNOSIS — F0150 Vascular dementia without behavioral disturbance: Secondary | ICD-10-CM | POA: Diagnosis not present

## 2015-04-11 DIAGNOSIS — I509 Heart failure, unspecified: Secondary | ICD-10-CM | POA: Diagnosis not present

## 2015-04-11 DIAGNOSIS — E039 Hypothyroidism, unspecified: Secondary | ICD-10-CM | POA: Diagnosis not present

## 2015-04-14 DIAGNOSIS — I10 Essential (primary) hypertension: Secondary | ICD-10-CM | POA: Diagnosis not present

## 2015-04-14 DIAGNOSIS — F0150 Vascular dementia without behavioral disturbance: Secondary | ICD-10-CM | POA: Diagnosis not present

## 2015-04-14 DIAGNOSIS — E785 Hyperlipidemia, unspecified: Secondary | ICD-10-CM | POA: Diagnosis not present

## 2015-04-14 DIAGNOSIS — I349 Nonrheumatic mitral valve disorder, unspecified: Secondary | ICD-10-CM | POA: Diagnosis not present

## 2015-04-14 DIAGNOSIS — I509 Heart failure, unspecified: Secondary | ICD-10-CM | POA: Diagnosis not present

## 2015-04-14 DIAGNOSIS — N189 Chronic kidney disease, unspecified: Secondary | ICD-10-CM | POA: Diagnosis not present

## 2015-04-15 DIAGNOSIS — I349 Nonrheumatic mitral valve disorder, unspecified: Secondary | ICD-10-CM | POA: Diagnosis not present

## 2015-04-15 DIAGNOSIS — F0150 Vascular dementia without behavioral disturbance: Secondary | ICD-10-CM | POA: Diagnosis not present

## 2015-04-15 DIAGNOSIS — N189 Chronic kidney disease, unspecified: Secondary | ICD-10-CM | POA: Diagnosis not present

## 2015-04-15 DIAGNOSIS — I509 Heart failure, unspecified: Secondary | ICD-10-CM | POA: Diagnosis not present

## 2015-04-15 DIAGNOSIS — I10 Essential (primary) hypertension: Secondary | ICD-10-CM | POA: Diagnosis not present

## 2015-04-15 DIAGNOSIS — E785 Hyperlipidemia, unspecified: Secondary | ICD-10-CM | POA: Diagnosis not present

## 2015-04-17 DIAGNOSIS — I349 Nonrheumatic mitral valve disorder, unspecified: Secondary | ICD-10-CM | POA: Diagnosis not present

## 2015-04-17 DIAGNOSIS — E785 Hyperlipidemia, unspecified: Secondary | ICD-10-CM | POA: Diagnosis not present

## 2015-04-17 DIAGNOSIS — I509 Heart failure, unspecified: Secondary | ICD-10-CM | POA: Diagnosis not present

## 2015-04-17 DIAGNOSIS — I10 Essential (primary) hypertension: Secondary | ICD-10-CM | POA: Diagnosis not present

## 2015-04-17 DIAGNOSIS — F0150 Vascular dementia without behavioral disturbance: Secondary | ICD-10-CM | POA: Diagnosis not present

## 2015-04-17 DIAGNOSIS — N189 Chronic kidney disease, unspecified: Secondary | ICD-10-CM | POA: Diagnosis not present

## 2015-04-21 DIAGNOSIS — E785 Hyperlipidemia, unspecified: Secondary | ICD-10-CM | POA: Diagnosis not present

## 2015-04-21 DIAGNOSIS — F0150 Vascular dementia without behavioral disturbance: Secondary | ICD-10-CM | POA: Diagnosis not present

## 2015-04-21 DIAGNOSIS — I349 Nonrheumatic mitral valve disorder, unspecified: Secondary | ICD-10-CM | POA: Diagnosis not present

## 2015-04-21 DIAGNOSIS — N189 Chronic kidney disease, unspecified: Secondary | ICD-10-CM | POA: Diagnosis not present

## 2015-04-21 DIAGNOSIS — I509 Heart failure, unspecified: Secondary | ICD-10-CM | POA: Diagnosis not present

## 2015-04-21 DIAGNOSIS — I10 Essential (primary) hypertension: Secondary | ICD-10-CM | POA: Diagnosis not present

## 2015-04-24 DIAGNOSIS — I509 Heart failure, unspecified: Secondary | ICD-10-CM | POA: Diagnosis not present

## 2015-04-24 DIAGNOSIS — I10 Essential (primary) hypertension: Secondary | ICD-10-CM | POA: Diagnosis not present

## 2015-04-24 DIAGNOSIS — F0150 Vascular dementia without behavioral disturbance: Secondary | ICD-10-CM | POA: Diagnosis not present

## 2015-04-24 DIAGNOSIS — E785 Hyperlipidemia, unspecified: Secondary | ICD-10-CM | POA: Diagnosis not present

## 2015-04-24 DIAGNOSIS — I349 Nonrheumatic mitral valve disorder, unspecified: Secondary | ICD-10-CM | POA: Diagnosis not present

## 2015-04-24 DIAGNOSIS — N189 Chronic kidney disease, unspecified: Secondary | ICD-10-CM | POA: Diagnosis not present

## 2015-04-26 DIAGNOSIS — I10 Essential (primary) hypertension: Secondary | ICD-10-CM | POA: Diagnosis not present

## 2015-04-26 DIAGNOSIS — N189 Chronic kidney disease, unspecified: Secondary | ICD-10-CM | POA: Diagnosis not present

## 2015-04-26 DIAGNOSIS — I509 Heart failure, unspecified: Secondary | ICD-10-CM | POA: Diagnosis not present

## 2015-04-26 DIAGNOSIS — I349 Nonrheumatic mitral valve disorder, unspecified: Secondary | ICD-10-CM | POA: Diagnosis not present

## 2015-04-26 DIAGNOSIS — E785 Hyperlipidemia, unspecified: Secondary | ICD-10-CM | POA: Diagnosis not present

## 2015-04-26 DIAGNOSIS — F0150 Vascular dementia without behavioral disturbance: Secondary | ICD-10-CM | POA: Diagnosis not present

## 2015-04-27 DIAGNOSIS — N189 Chronic kidney disease, unspecified: Secondary | ICD-10-CM | POA: Diagnosis not present

## 2015-04-27 DIAGNOSIS — I509 Heart failure, unspecified: Secondary | ICD-10-CM | POA: Diagnosis not present

## 2015-04-27 DIAGNOSIS — I349 Nonrheumatic mitral valve disorder, unspecified: Secondary | ICD-10-CM | POA: Diagnosis not present

## 2015-04-27 DIAGNOSIS — E785 Hyperlipidemia, unspecified: Secondary | ICD-10-CM | POA: Diagnosis not present

## 2015-04-27 DIAGNOSIS — I10 Essential (primary) hypertension: Secondary | ICD-10-CM | POA: Diagnosis not present

## 2015-04-27 DIAGNOSIS — F0150 Vascular dementia without behavioral disturbance: Secondary | ICD-10-CM | POA: Diagnosis not present

## 2015-04-28 DIAGNOSIS — F0150 Vascular dementia without behavioral disturbance: Secondary | ICD-10-CM | POA: Diagnosis not present

## 2015-04-28 DIAGNOSIS — I509 Heart failure, unspecified: Secondary | ICD-10-CM | POA: Diagnosis not present

## 2015-04-28 DIAGNOSIS — I10 Essential (primary) hypertension: Secondary | ICD-10-CM | POA: Diagnosis not present

## 2015-04-28 DIAGNOSIS — I349 Nonrheumatic mitral valve disorder, unspecified: Secondary | ICD-10-CM | POA: Diagnosis not present

## 2015-04-28 DIAGNOSIS — N189 Chronic kidney disease, unspecified: Secondary | ICD-10-CM | POA: Diagnosis not present

## 2015-04-28 DIAGNOSIS — E785 Hyperlipidemia, unspecified: Secondary | ICD-10-CM | POA: Diagnosis not present

## 2015-04-29 DIAGNOSIS — I509 Heart failure, unspecified: Secondary | ICD-10-CM | POA: Diagnosis not present

## 2015-04-29 DIAGNOSIS — N189 Chronic kidney disease, unspecified: Secondary | ICD-10-CM | POA: Diagnosis not present

## 2015-04-29 DIAGNOSIS — I10 Essential (primary) hypertension: Secondary | ICD-10-CM | POA: Diagnosis not present

## 2015-04-29 DIAGNOSIS — E785 Hyperlipidemia, unspecified: Secondary | ICD-10-CM | POA: Diagnosis not present

## 2015-04-29 DIAGNOSIS — I349 Nonrheumatic mitral valve disorder, unspecified: Secondary | ICD-10-CM | POA: Diagnosis not present

## 2015-04-29 DIAGNOSIS — F0150 Vascular dementia without behavioral disturbance: Secondary | ICD-10-CM | POA: Diagnosis not present

## 2015-05-01 DIAGNOSIS — N189 Chronic kidney disease, unspecified: Secondary | ICD-10-CM | POA: Diagnosis not present

## 2015-05-01 DIAGNOSIS — I10 Essential (primary) hypertension: Secondary | ICD-10-CM | POA: Diagnosis not present

## 2015-05-01 DIAGNOSIS — F0150 Vascular dementia without behavioral disturbance: Secondary | ICD-10-CM | POA: Diagnosis not present

## 2015-05-01 DIAGNOSIS — I509 Heart failure, unspecified: Secondary | ICD-10-CM | POA: Diagnosis not present

## 2015-05-01 DIAGNOSIS — I349 Nonrheumatic mitral valve disorder, unspecified: Secondary | ICD-10-CM | POA: Diagnosis not present

## 2015-05-01 DIAGNOSIS — E785 Hyperlipidemia, unspecified: Secondary | ICD-10-CM | POA: Diagnosis not present

## 2015-05-02 DIAGNOSIS — I509 Heart failure, unspecified: Secondary | ICD-10-CM | POA: Diagnosis not present

## 2015-05-02 DIAGNOSIS — F0150 Vascular dementia without behavioral disturbance: Secondary | ICD-10-CM | POA: Diagnosis not present

## 2015-05-02 DIAGNOSIS — I10 Essential (primary) hypertension: Secondary | ICD-10-CM | POA: Diagnosis not present

## 2015-05-02 DIAGNOSIS — I349 Nonrheumatic mitral valve disorder, unspecified: Secondary | ICD-10-CM | POA: Diagnosis not present

## 2015-05-02 DIAGNOSIS — N189 Chronic kidney disease, unspecified: Secondary | ICD-10-CM | POA: Diagnosis not present

## 2015-05-02 DIAGNOSIS — E785 Hyperlipidemia, unspecified: Secondary | ICD-10-CM | POA: Diagnosis not present

## 2015-05-08 DIAGNOSIS — E785 Hyperlipidemia, unspecified: Secondary | ICD-10-CM | POA: Diagnosis not present

## 2015-05-08 DIAGNOSIS — I10 Essential (primary) hypertension: Secondary | ICD-10-CM | POA: Diagnosis not present

## 2015-05-08 DIAGNOSIS — F0150 Vascular dementia without behavioral disturbance: Secondary | ICD-10-CM | POA: Diagnosis not present

## 2015-05-08 DIAGNOSIS — I349 Nonrheumatic mitral valve disorder, unspecified: Secondary | ICD-10-CM | POA: Diagnosis not present

## 2015-05-08 DIAGNOSIS — N189 Chronic kidney disease, unspecified: Secondary | ICD-10-CM | POA: Diagnosis not present

## 2015-05-08 DIAGNOSIS — I509 Heart failure, unspecified: Secondary | ICD-10-CM | POA: Diagnosis not present

## 2015-05-11 DIAGNOSIS — F0150 Vascular dementia without behavioral disturbance: Secondary | ICD-10-CM | POA: Diagnosis not present

## 2015-05-11 DIAGNOSIS — R630 Anorexia: Secondary | ICD-10-CM | POA: Diagnosis not present

## 2015-05-11 DIAGNOSIS — R634 Abnormal weight loss: Secondary | ICD-10-CM | POA: Diagnosis not present

## 2015-05-11 DIAGNOSIS — F339 Major depressive disorder, recurrent, unspecified: Secondary | ICD-10-CM | POA: Diagnosis not present

## 2015-05-11 DIAGNOSIS — E785 Hyperlipidemia, unspecified: Secondary | ICD-10-CM | POA: Diagnosis not present

## 2015-05-11 DIAGNOSIS — E039 Hypothyroidism, unspecified: Secondary | ICD-10-CM | POA: Diagnosis not present

## 2015-05-11 DIAGNOSIS — I349 Nonrheumatic mitral valve disorder, unspecified: Secondary | ICD-10-CM | POA: Diagnosis not present

## 2015-05-11 DIAGNOSIS — I509 Heart failure, unspecified: Secondary | ICD-10-CM | POA: Diagnosis not present

## 2015-05-11 DIAGNOSIS — N189 Chronic kidney disease, unspecified: Secondary | ICD-10-CM | POA: Diagnosis not present

## 2015-05-11 DIAGNOSIS — C50919 Malignant neoplasm of unspecified site of unspecified female breast: Secondary | ICD-10-CM | POA: Diagnosis not present

## 2015-05-11 DIAGNOSIS — I10 Essential (primary) hypertension: Secondary | ICD-10-CM | POA: Diagnosis not present

## 2015-05-12 DIAGNOSIS — F0150 Vascular dementia without behavioral disturbance: Secondary | ICD-10-CM | POA: Diagnosis not present

## 2015-05-12 DIAGNOSIS — E785 Hyperlipidemia, unspecified: Secondary | ICD-10-CM | POA: Diagnosis not present

## 2015-05-12 DIAGNOSIS — I349 Nonrheumatic mitral valve disorder, unspecified: Secondary | ICD-10-CM | POA: Diagnosis not present

## 2015-05-12 DIAGNOSIS — I10 Essential (primary) hypertension: Secondary | ICD-10-CM | POA: Diagnosis not present

## 2015-05-12 DIAGNOSIS — I509 Heart failure, unspecified: Secondary | ICD-10-CM | POA: Diagnosis not present

## 2015-05-12 DIAGNOSIS — N189 Chronic kidney disease, unspecified: Secondary | ICD-10-CM | POA: Diagnosis not present

## 2015-05-13 DIAGNOSIS — F0150 Vascular dementia without behavioral disturbance: Secondary | ICD-10-CM | POA: Diagnosis not present

## 2015-05-13 DIAGNOSIS — N189 Chronic kidney disease, unspecified: Secondary | ICD-10-CM | POA: Diagnosis not present

## 2015-05-13 DIAGNOSIS — I10 Essential (primary) hypertension: Secondary | ICD-10-CM | POA: Diagnosis not present

## 2015-05-13 DIAGNOSIS — I509 Heart failure, unspecified: Secondary | ICD-10-CM | POA: Diagnosis not present

## 2015-05-13 DIAGNOSIS — I349 Nonrheumatic mitral valve disorder, unspecified: Secondary | ICD-10-CM | POA: Diagnosis not present

## 2015-05-13 DIAGNOSIS — E785 Hyperlipidemia, unspecified: Secondary | ICD-10-CM | POA: Diagnosis not present

## 2015-05-18 DIAGNOSIS — I349 Nonrheumatic mitral valve disorder, unspecified: Secondary | ICD-10-CM | POA: Diagnosis not present

## 2015-05-18 DIAGNOSIS — F0150 Vascular dementia without behavioral disturbance: Secondary | ICD-10-CM | POA: Diagnosis not present

## 2015-05-18 DIAGNOSIS — I509 Heart failure, unspecified: Secondary | ICD-10-CM | POA: Diagnosis not present

## 2015-05-18 DIAGNOSIS — N189 Chronic kidney disease, unspecified: Secondary | ICD-10-CM | POA: Diagnosis not present

## 2015-05-18 DIAGNOSIS — E785 Hyperlipidemia, unspecified: Secondary | ICD-10-CM | POA: Diagnosis not present

## 2015-05-18 DIAGNOSIS — I10 Essential (primary) hypertension: Secondary | ICD-10-CM | POA: Diagnosis not present

## 2015-05-19 DIAGNOSIS — I509 Heart failure, unspecified: Secondary | ICD-10-CM | POA: Diagnosis not present

## 2015-05-19 DIAGNOSIS — F0150 Vascular dementia without behavioral disturbance: Secondary | ICD-10-CM | POA: Diagnosis not present

## 2015-05-19 DIAGNOSIS — N189 Chronic kidney disease, unspecified: Secondary | ICD-10-CM | POA: Diagnosis not present

## 2015-05-19 DIAGNOSIS — E785 Hyperlipidemia, unspecified: Secondary | ICD-10-CM | POA: Diagnosis not present

## 2015-05-19 DIAGNOSIS — I349 Nonrheumatic mitral valve disorder, unspecified: Secondary | ICD-10-CM | POA: Diagnosis not present

## 2015-05-19 DIAGNOSIS — I10 Essential (primary) hypertension: Secondary | ICD-10-CM | POA: Diagnosis not present

## 2015-05-23 DIAGNOSIS — N189 Chronic kidney disease, unspecified: Secondary | ICD-10-CM | POA: Diagnosis not present

## 2015-05-23 DIAGNOSIS — I349 Nonrheumatic mitral valve disorder, unspecified: Secondary | ICD-10-CM | POA: Diagnosis not present

## 2015-05-23 DIAGNOSIS — F0150 Vascular dementia without behavioral disturbance: Secondary | ICD-10-CM | POA: Diagnosis not present

## 2015-05-23 DIAGNOSIS — I509 Heart failure, unspecified: Secondary | ICD-10-CM | POA: Diagnosis not present

## 2015-05-23 DIAGNOSIS — I10 Essential (primary) hypertension: Secondary | ICD-10-CM | POA: Diagnosis not present

## 2015-05-23 DIAGNOSIS — E785 Hyperlipidemia, unspecified: Secondary | ICD-10-CM | POA: Diagnosis not present

## 2015-05-23 NOTE — Progress Notes (Signed)
This encounter was created in error - please disregard.

## 2015-05-24 DIAGNOSIS — I10 Essential (primary) hypertension: Secondary | ICD-10-CM | POA: Diagnosis not present

## 2015-05-24 DIAGNOSIS — N189 Chronic kidney disease, unspecified: Secondary | ICD-10-CM | POA: Diagnosis not present

## 2015-05-24 DIAGNOSIS — I349 Nonrheumatic mitral valve disorder, unspecified: Secondary | ICD-10-CM | POA: Diagnosis not present

## 2015-05-24 DIAGNOSIS — E785 Hyperlipidemia, unspecified: Secondary | ICD-10-CM | POA: Diagnosis not present

## 2015-05-24 DIAGNOSIS — I509 Heart failure, unspecified: Secondary | ICD-10-CM | POA: Diagnosis not present

## 2015-05-24 DIAGNOSIS — F0150 Vascular dementia without behavioral disturbance: Secondary | ICD-10-CM | POA: Diagnosis not present

## 2015-05-25 DIAGNOSIS — I509 Heart failure, unspecified: Secondary | ICD-10-CM | POA: Diagnosis not present

## 2015-05-25 DIAGNOSIS — F0150 Vascular dementia without behavioral disturbance: Secondary | ICD-10-CM | POA: Diagnosis not present

## 2015-05-25 DIAGNOSIS — I10 Essential (primary) hypertension: Secondary | ICD-10-CM | POA: Diagnosis not present

## 2015-05-25 DIAGNOSIS — E785 Hyperlipidemia, unspecified: Secondary | ICD-10-CM | POA: Diagnosis not present

## 2015-05-25 DIAGNOSIS — I349 Nonrheumatic mitral valve disorder, unspecified: Secondary | ICD-10-CM | POA: Diagnosis not present

## 2015-05-25 DIAGNOSIS — N189 Chronic kidney disease, unspecified: Secondary | ICD-10-CM | POA: Diagnosis not present

## 2015-05-29 DIAGNOSIS — F0150 Vascular dementia without behavioral disturbance: Secondary | ICD-10-CM | POA: Diagnosis not present

## 2015-05-29 DIAGNOSIS — I509 Heart failure, unspecified: Secondary | ICD-10-CM | POA: Diagnosis not present

## 2015-05-29 DIAGNOSIS — N189 Chronic kidney disease, unspecified: Secondary | ICD-10-CM | POA: Diagnosis not present

## 2015-05-29 DIAGNOSIS — I10 Essential (primary) hypertension: Secondary | ICD-10-CM | POA: Diagnosis not present

## 2015-05-29 DIAGNOSIS — E785 Hyperlipidemia, unspecified: Secondary | ICD-10-CM | POA: Diagnosis not present

## 2015-05-29 DIAGNOSIS — I349 Nonrheumatic mitral valve disorder, unspecified: Secondary | ICD-10-CM | POA: Diagnosis not present

## 2015-05-31 DIAGNOSIS — F0150 Vascular dementia without behavioral disturbance: Secondary | ICD-10-CM | POA: Diagnosis not present

## 2015-05-31 DIAGNOSIS — I10 Essential (primary) hypertension: Secondary | ICD-10-CM | POA: Diagnosis not present

## 2015-05-31 DIAGNOSIS — N189 Chronic kidney disease, unspecified: Secondary | ICD-10-CM | POA: Diagnosis not present

## 2015-05-31 DIAGNOSIS — I349 Nonrheumatic mitral valve disorder, unspecified: Secondary | ICD-10-CM | POA: Diagnosis not present

## 2015-05-31 DIAGNOSIS — E785 Hyperlipidemia, unspecified: Secondary | ICD-10-CM | POA: Diagnosis not present

## 2015-05-31 DIAGNOSIS — I509 Heart failure, unspecified: Secondary | ICD-10-CM | POA: Diagnosis not present

## 2015-06-02 DIAGNOSIS — I10 Essential (primary) hypertension: Secondary | ICD-10-CM | POA: Diagnosis not present

## 2015-06-02 DIAGNOSIS — F0150 Vascular dementia without behavioral disturbance: Secondary | ICD-10-CM | POA: Diagnosis not present

## 2015-06-02 DIAGNOSIS — N189 Chronic kidney disease, unspecified: Secondary | ICD-10-CM | POA: Diagnosis not present

## 2015-06-02 DIAGNOSIS — E785 Hyperlipidemia, unspecified: Secondary | ICD-10-CM | POA: Diagnosis not present

## 2015-06-02 DIAGNOSIS — I349 Nonrheumatic mitral valve disorder, unspecified: Secondary | ICD-10-CM | POA: Diagnosis not present

## 2015-06-02 DIAGNOSIS — I509 Heart failure, unspecified: Secondary | ICD-10-CM | POA: Diagnosis not present

## 2015-06-07 DIAGNOSIS — N189 Chronic kidney disease, unspecified: Secondary | ICD-10-CM | POA: Diagnosis not present

## 2015-06-07 DIAGNOSIS — I349 Nonrheumatic mitral valve disorder, unspecified: Secondary | ICD-10-CM | POA: Diagnosis not present

## 2015-06-07 DIAGNOSIS — F0150 Vascular dementia without behavioral disturbance: Secondary | ICD-10-CM | POA: Diagnosis not present

## 2015-06-07 DIAGNOSIS — E785 Hyperlipidemia, unspecified: Secondary | ICD-10-CM | POA: Diagnosis not present

## 2015-06-07 DIAGNOSIS — I509 Heart failure, unspecified: Secondary | ICD-10-CM | POA: Diagnosis not present

## 2015-06-07 DIAGNOSIS — I10 Essential (primary) hypertension: Secondary | ICD-10-CM | POA: Diagnosis not present

## 2015-06-08 DIAGNOSIS — I10 Essential (primary) hypertension: Secondary | ICD-10-CM | POA: Diagnosis not present

## 2015-06-08 DIAGNOSIS — I509 Heart failure, unspecified: Secondary | ICD-10-CM | POA: Diagnosis not present

## 2015-06-08 DIAGNOSIS — E785 Hyperlipidemia, unspecified: Secondary | ICD-10-CM | POA: Diagnosis not present

## 2015-06-08 DIAGNOSIS — I349 Nonrheumatic mitral valve disorder, unspecified: Secondary | ICD-10-CM | POA: Diagnosis not present

## 2015-06-08 DIAGNOSIS — F0150 Vascular dementia without behavioral disturbance: Secondary | ICD-10-CM | POA: Diagnosis not present

## 2015-06-08 DIAGNOSIS — N189 Chronic kidney disease, unspecified: Secondary | ICD-10-CM | POA: Diagnosis not present

## 2015-06-10 DIAGNOSIS — N189 Chronic kidney disease, unspecified: Secondary | ICD-10-CM | POA: Diagnosis not present

## 2015-06-10 DIAGNOSIS — I509 Heart failure, unspecified: Secondary | ICD-10-CM | POA: Diagnosis not present

## 2015-06-10 DIAGNOSIS — F0150 Vascular dementia without behavioral disturbance: Secondary | ICD-10-CM | POA: Diagnosis not present

## 2015-06-10 DIAGNOSIS — E785 Hyperlipidemia, unspecified: Secondary | ICD-10-CM | POA: Diagnosis not present

## 2015-06-10 DIAGNOSIS — I349 Nonrheumatic mitral valve disorder, unspecified: Secondary | ICD-10-CM | POA: Diagnosis not present

## 2015-06-10 DIAGNOSIS — I10 Essential (primary) hypertension: Secondary | ICD-10-CM | POA: Diagnosis not present

## 2015-09-27 ENCOUNTER — Encounter: Payer: Self-pay | Admitting: Internal Medicine

## 2015-09-27 ENCOUNTER — Non-Acute Institutional Stay: Payer: Medicare Other | Admitting: Internal Medicine

## 2015-09-27 VITALS — BP 110/78 | HR 68 | Temp 98.1°F | Ht <= 58 in | Wt 107.0 lb

## 2015-09-27 DIAGNOSIS — F0390 Unspecified dementia without behavioral disturbance: Secondary | ICD-10-CM | POA: Diagnosis not present

## 2015-09-27 DIAGNOSIS — R918 Other nonspecific abnormal finding of lung field: Secondary | ICD-10-CM

## 2015-09-27 DIAGNOSIS — R269 Unspecified abnormalities of gait and mobility: Secondary | ICD-10-CM | POA: Diagnosis not present

## 2015-09-27 DIAGNOSIS — F1010 Alcohol abuse, uncomplicated: Secondary | ICD-10-CM

## 2015-09-27 DIAGNOSIS — E039 Hypothyroidism, unspecified: Secondary | ICD-10-CM | POA: Diagnosis not present

## 2015-09-27 DIAGNOSIS — I5042 Chronic combined systolic (congestive) and diastolic (congestive) heart failure: Secondary | ICD-10-CM

## 2015-09-27 NOTE — Progress Notes (Signed)
Patient ID: Tara Garrett, female   DOB: 11/21/1922, 82 y.o.   MRN: [id number]   Location:  [facility] (12)  Provider: Kaion Tisdale L. Mariea Clonts, D.O., C.M.D.  Code Status: DNR Goals of Care:  Advanced Directives 09/27/2015  Does patient have an advance directive? Yes  Type of Paramedic of [place];Living will;Out of facility DNR (pink MOST or yellow form)  Copy of advanced directive(s) in chart? Yes  Pre-existing out of facility DNR order (yellow form or pink MOST form) Yellow form placed in chart (order not valid for inpatient use);Pink MOST form placed in chart (order not valid for inpatient use)   Chief Complaint  Patient presents with  . Medical Management of Chronic Issues    follow-up    HPI: Patient is a 82 y.o. female seen today for medical management of chronic diseases.  She has gained 7 lbs since visit in [place].  She has graduated from hospice care for her lung mass.  She is doing very well.  She feels good--she's walking with her walker.  She plans to improve and hopes to graduate from her walker.  Breathing well.  Continues to use oxygen at night.  Has caregiver until 9pm.  She goes to bed early b/c of that and she sleeps through until morning most nights.  Leaves bathroom light on to make her way through the hall if she does wake up.  She remembers that I told her she was having too much wine.  Tells me that her friends bring her wine which staff are storing away from her.  Says she is the oldest one that lived in her family.  B/c she is older, she thinks she should be permitted to drink more wine.  Past Medical History  Diagnosis Date  . Varicose veins   . Hypothyroidism   . Vitamin D deficiency   . Hyperlipidemia   . Hypertension   . Shoulder pain, right 10/2009    X- rayed at Dr. Berenice Primas.Severe Degenerative Disease. Recommended an artificial shoulder. Saw Dr. Tamera Punt .  Marland Kitchen Right shoulder injury 12/2009  .  Leg pain   . Hyperglycemia   . Macular degeneration   . Keratosis seborrheica   . Left knee pain   . Osteoporosis   . Neoplasm of breast     right breast cancer  . Fall 10-09-2011    fell outdoors on driveway fell and hit right side of body and face  . Pain in joint, pelvic region and thigh 05/11/2012  . Pain in joint, site unspecified 02/03/39  . Phlebitis and thrombophlebitis of other deep vessels of lower extremities 01/06/2012  . Other malaise and fatigue 01/06/2012  . Closed fracture of two ribs 10/21/2011, 08/24/2013  . Other specified circulatory system disorders 10/09/2011  . Open wound of knee, leg (except thigh), and ankle, without mention of complication [id number]  . Closed fracture of unspecified part of upper end of humerus 01/13/2005  . Female stress incontinence 02/10/2004  . Herpes zoster without mention of complication [id number]  . Malignant neoplasm of breast (female), unspecified site   . Varicose vein 2014  . Multiple fractures of ribs of left side 08/24/2013  . Gait disorder 08/25/2013    Poor balance     Past Surgical History  Procedure Laterality Date  . Left cataract  1999  . Right mastectomy  1989  . Vesicovaginal fistula closure w/ tah  1967  . Colon polyps  [place]  . Cataract extraction  04/2004    Dr. Bing Plume  . Mole removal  03/04/2009    on back by Dr. Derrel Nip  . Endovenous ablation saphenous vein w/ laser  01-30-2012    left greater saphenous vein and sclerotherapy left leg  . Abdominal hysterectomy  1967    Allergies  Allergen Reactions  . Levaquin [Levofloxacin In D5w] Diarrhea  . Mobic [Meloxicam]     Blood pressure goes up  . Tramadol     Blood pressure goes up       Medication List       This list is accurate as of: 09/27/15 11:51 AM.  Always use your most recent med list.               acetaminophen 500 MG tablet  Commonly known as:  TYLENOL  Take 500 mg by mouth every 6 (six) hours as needed (for pain).      albuterol 0.63 MG/3ML nebulizer solution  Commonly known as:  ACCUNEB     aspirin 325 MG EC tablet  Take 325 mg by mouth once a week. On Tuesdays     BOOST BREEZE PO  Take by mouth. One can twice daily     CALCIUM 500 +D 500-400 MG-UNIT Tabs  Generic drug:  Calcium Carb-Cholecalciferol  Take 1 tablet by mouth daily.     carvedilol 3.125 MG tablet  Commonly known as:  COREG  Take 1 tablet (3.125 mg total) by mouth 2 (two) times daily with a meal.     cholecalciferol 1000 units tablet  Commonly known as:  VITAMIN D  Take 2,000 Units by mouth daily.     furosemide 80 MG tablet  Commonly known as:  LASIX  Take 80 mg by mouth as directed. 80 mg in the morning     levothyroxine 25 MCG tablet  Commonly known as:  SYNTHROID, LEVOTHROID  Take 1 tablet by mouth daily.     loperamide 2 MG capsule  Commonly known as:  IMODIUM  Take by mouth. Take 2 tablets for each loose stool, max 6 in 24 hour     losartan 25 MG tablet  Commonly known as:  COZAAR  Take 25 mg by mouth daily.     Melatonin 10 MG Tabs  Take 10 mg by mouth daily. One at bedtime     potassium chloride SA 20 MEQ tablet  Commonly known as:  K-DUR,KLOR-CON  Take 40 mEq by mouth daily.     sertraline 50 MG tablet  Commonly known as:  ZOLOFT     TESSALON PERLES 100 MG capsule  Generic drug:  benzonatate  Take by mouth. One every 8 hours as needed for cough. One at bedtime        Review of Systems:  Review of Systems  Health Maintenance  Topic Date Due  . TETANUS/TDAP  08/25/1943  . ZOSTAVAX  08/24/1984  . PNA vac Low Risk Adult (1 of 2 - PCV13) 08/24/1989  . INFLUENZA VACCINE  01/09/2016  . DEXA SCAN  Completed    Physical Exam: Filed Vitals:   09/27/15 1140  BP: 110/78  Pulse: 68  Temp: 98.1 F (36.7 C)  TempSrc: Oral  Height: 4\' 9"  (1.448 m)  Weight: 107 lb (48.535 kg)  SpO2: 94%   Body mass index is 23.15 kg/(m^2). Physical Exam  Labs reviewed: Basic Metabolic Panel:  Recent Labs   10/20/14 11/04/14 1433 12/01/14 12/14/14 1238  NA 135* 132*  --  142  K 3.5 4.2  --  4.1  CL  --  95*  --  103  CO2  --  31  --  30  GLUCOSE  --  186*  --  87  BUN 15 17  --  26*  CREATININE 0.6 0.86  --  1.02  CALCIUM  --  9.9  --  10.5  TSH 11.38*  --  2.73  --    Liver Function Tests:  Recent Labs  10/20/14  AST 19  ALT 10  ALKPHOS 37   No results for input(s): LIPASE, AMYLASE in the last 8760 hours. No results for input(s): AMMONIA in the last 8760 hours. CBC:  Recent Labs  10/20/14 12/14/14 1238  WBC 7.9 7.5  NEUTROABS  --  5.4  HGB 11.5* 11.8*  HCT 34* 35.0*  MCV  --  86.4  PLT 237 234.0   Lipid Panel: No results for input(s): CHOL, HDL, LDLCALC, TRIG, CHOLHDL, LDLDIRECT in the last 8760 hours. Lab Results  Component Value Date   HGBA1C 5.1 01/27/2014    Procedures since last visit: No results found.  Assessment/Plan 1. Dementia, without behavioral disturbance -has short term memory loss -has concerns about medical issues from her childhood -needs mmse at next visit  2. Chronic combined systolic and diastolic CHF (congestive heart failure) (HCC) -has been stable for a long time now -she has graduated hospice care and continues on losartan, asa, coreg, lasix 80mg  daily each morning  3. Lung mass -remains, has not been reevaluated at pt and hcpoa's request -weight stabilized--seems her declining status may have been more due to her alcohol abuse and depression than the lung mass itself  4. Alcohol abuse -has limited alcohol intake now, but was asking for more which was not granted due to historical overuse and abuse that led to falls, worsening cognition, depression, confabulation  5. Gait disorder -cont to use rollator walker for support and walk for exercise, attend activities   6. Hypothyroidism, unspecified hypothyroidism type -f/u tsh and cont same dose synthroid for now 73mcg  Labs/tests ordered:  Tsh, bmp Next appt:  01/24/2016 med mgt,  MMSE  Tara Garrett, D.O. [facility] [address] [address] Cell Phone (Mon-Fri 8am-5pm):  [phone] On Call:  [phone] & follow prompts after 5pm & weekends Office Phone:  [phone] Office Fax:  [phone]

## 2015-09-28 DIAGNOSIS — I5042 Chronic combined systolic (congestive) and diastolic (congestive) heart failure: Secondary | ICD-10-CM | POA: Diagnosis not present

## 2015-09-28 DIAGNOSIS — E039 Hypothyroidism, unspecified: Secondary | ICD-10-CM | POA: Diagnosis not present

## 2015-09-28 LAB — CBC AND DIFFERENTIAL
HCT: 29 % — AB (ref 36–46)
Hemoglobin: 10.3 g/dL — AB (ref 12.0–16.0)
Platelets: 191 10*3/uL (ref 150–399)
WBC: 6.8 10^3/mL

## 2015-09-28 LAB — HEPATIC FUNCTION PANEL
ALT: 5 U/L — AB (ref 7–35)
AST: 14 U/L (ref 13–35)
Alkaline Phosphatase: 58 U/L (ref 25–125)
Bilirubin, Total: 0.3 mg/dL

## 2015-09-28 LAB — BASIC METABOLIC PANEL
BUN: 27 mg/dL — AB (ref 4–21)
Creatinine: 0.9 mg/dL (ref 0.5–1.1)
Glucose: 91 mg/dL
Potassium: 4.1 mmol/L (ref 3.4–5.3)
Sodium: 139 mmol/L (ref 137–147)

## 2015-09-28 LAB — TSH: TSH: 2.71 u[IU]/mL (ref 0.41–5.90)

## 2015-11-03 ENCOUNTER — Non-Acute Institutional Stay: Payer: Medicare Other | Admitting: Adult Health

## 2015-11-03 DIAGNOSIS — T148XXA Other injury of unspecified body region, initial encounter: Principal | ICD-10-CM

## 2015-11-03 DIAGNOSIS — L089 Local infection of the skin and subcutaneous tissue, unspecified: Secondary | ICD-10-CM

## 2015-11-03 DIAGNOSIS — T148 Other injury of unspecified body region: Secondary | ICD-10-CM

## 2015-11-06 ENCOUNTER — Encounter: Payer: Self-pay | Admitting: Adult Health

## 2015-11-06 DIAGNOSIS — Z79899 Other long term (current) drug therapy: Secondary | ICD-10-CM | POA: Diagnosis not present

## 2015-11-06 DIAGNOSIS — A048 Other specified bacterial intestinal infections: Secondary | ICD-10-CM | POA: Diagnosis not present

## 2015-11-06 NOTE — Progress Notes (Signed)
Patient ID: Tara Garrett, female   DOB: 1922/10/11, 82 y.o.   MRN: [id number]  Location:  [place]:  [facility] (13) Provider:   Cindi Carbon, ANP [place] [phone]   Garrett, Tara Sidle, DO  Patient Care Team: [name], DO as PCP - General (Geriatric Medicine) [name], MD (Orthopedic Surgery) [name], MD (Ophthalmology) [facility] [name], MD as Consulting Physician (Cardiology)  Extended Emergency Contact Information Primary Emergency Contact: [name],B J Address: [address]          [name] [address] [name] of [place] Phone: [phone] Mobile Phone: [phone] Relation: Friend  Code Status:  DNR Goals of care: Advanced Directive information Advanced Directives 09/27/2015  Does patient have an advance directive? Yes  Type of Paramedic of [place];Living will;Out of facility DNR (pink MOST or yellow form)  Copy of advanced directive(s) in chart? Yes  Pre-existing out of facility DNR order (yellow form or pink MOST form) Yellow form placed in chart (order not valid for inpatient use);Pink MOST form placed in chart (order not valid for inpatient use)     Chief Complaint  Patient presents with  . Acute Visit    skin tear    HPI:  Pt is a 82 y.o. female seen today for an acute visit for evaluation of a skin tear sustained after a fall. Resident reports mild pain to the area, staff is reporting drainage, erythema, swelling, and odor. The wound care is performed by the staff with dressing changes daily. They have not seen improvement or healing. The resident reports that she is eating and drinking well and in her usual state of health. She does not have a fever.    Past Medical History  Diagnosis Date  . Varicose veins   . Hypothyroidism   . Vitamin D deficiency   . Hyperlipidemia   . Hypertension   . Shoulder pain, right 10/2009    X-  rayed at Dr. Berenice Primas.Severe Degenerative Disease. Recommended an artificial shoulder. Saw Dr. Tamera Punt .  Marland Kitchen Right shoulder injury 12/2009  . Leg pain   . Hyperglycemia   . Macular degeneration   . Keratosis seborrheica   . Left knee pain   . Osteoporosis   . Neoplasm of breast     right breast cancer  . Fall 10-09-2011    fell outdoors on driveway fell and hit right side of body and face  . Pain in joint, pelvic region and thigh 05/11/2012  . Pain in joint, site unspecified 02/03/39  . Phlebitis and thrombophlebitis of other deep vessels of lower extremities 01/06/2012  . Other malaise and fatigue 01/06/2012  . Closed fracture of two ribs 10/21/2011, 08/24/2013  . Other specified circulatory system disorders 10/09/2011  . Open wound of knee, leg (except thigh), and ankle, without mention of complication [id number]  . Closed fracture of unspecified part of upper end of humerus 01/13/2005  . Female stress incontinence 02/10/2004  . Herpes zoster without mention of complication [id number]  . Malignant neoplasm of breast (female), unspecified site   . Varicose vein 2014  . Multiple fractures of ribs of left side 08/24/2013  . Gait disorder 08/25/2013    Poor balance    Past Surgical History  Procedure Laterality Date  . Left cataract  1999  . Right mastectomy  1989  . Vesicovaginal fistula closure w/ tah  1967  . Colon polyps  1989, 1992, 1995, 1997  .  Cataract extraction  04/2004    Dr. Bing Plume  . Mole removal  03/04/2009    on back by Dr. Derrel Nip  . Endovenous ablation saphenous vein w/ laser  01-30-2012    left greater saphenous vein and sclerotherapy left leg  . Abdominal hysterectomy  1967    Allergies  Allergen Reactions  . Levaquin [Levofloxacin In D5w] Diarrhea  . Mobic [Meloxicam]     Blood pressure goes up  . Tramadol     Blood pressure goes up       Medication List       This list is accurate as of: 11/03/15 11:59 PM.  Always use your most recent med list.                acetaminophen 500 MG tablet  Commonly known as:  TYLENOL  Take 500 mg by mouth every 6 (six) hours as needed (for pain).     albuterol 0.63 MG/3ML nebulizer solution  Commonly known as:  ACCUNEB     aspirin 325 MG EC tablet  Take 325 mg by mouth once a week. On Tuesdays     BOOST BREEZE PO  Take by mouth. One can twice daily     CALCIUM 500 +D 500-400 MG-UNIT Tabs  Generic drug:  Calcium Carb-Cholecalciferol  Take 1 tablet by mouth daily.     carvedilol 3.125 MG tablet  Commonly known as:  COREG  Take 1 tablet (3.125 mg total) by mouth 2 (two) times daily with a meal.     cholecalciferol 1000 units tablet  Commonly known as:  VITAMIN D  Take 2,000 Units by mouth daily.     furosemide 80 MG tablet  Commonly known as:  LASIX  Take 80 mg by mouth as directed. 80 mg in the morning     levothyroxine 25 MCG tablet  Commonly known as:  SYNTHROID, LEVOTHROID  Take 1 tablet by mouth daily.     loperamide 2 MG capsule  Commonly known as:  IMODIUM  Take by mouth. Take 2 tablets for each loose stool, max 6 in 24 hour     losartan 25 MG tablet  Commonly known as:  COZAAR  Take 25 mg by mouth daily.     Melatonin 10 MG Tabs  Take 10 mg by mouth daily. One at bedtime     potassium chloride SA 20 MEQ tablet  Commonly known as:  K-DUR,KLOR-CON  Take 40 mEq by mouth daily.     sertraline 50 MG tablet  Commonly known as:  ZOLOFT     TESSALON PERLES 100 MG capsule  Generic drug:  benzonatate  Take by mouth. One every 8 hours as needed for cough. One at bedtime        Review of Systems  Constitutional: Negative for fever, chills, diaphoresis, activity change, appetite change, fatigue and unexpected weight change.  HENT: Negative for congestion.   Respiratory: Negative for cough, shortness of breath and wheezing.   Cardiovascular: Negative for chest pain, palpitations and leg swelling.  Gastrointestinal: Negative for diarrhea, constipation and abdominal distention.    Genitourinary: Negative for dysuria and difficulty urinating.  Musculoskeletal: Positive for arthralgias and gait problem. Negative for myalgias, back pain and joint swelling.  Skin: Positive for wound.  Neurological: Negative for dizziness and facial asymmetry.  Psychiatric/Behavioral: Positive for behavioral problems and confusion. Negative for agitation.    Immunization History  Administered Date(s) Administered  . Influenza Whole 03/10/2012   Pertinent  Health Maintenance Due  Topic Date Due  . PNA vac Low Risk Adult (1 of 2 - PCV13) 08/24/1989  . INFLUENZA VACCINE  01/09/2016  . DEXA SCAN  Completed   Fall Risk  09/27/2015 04/11/2014  Falls in the past year? No Yes  Number falls in past yr: - 1  Injury with Fall? - Yes   Functional Status Survey:    Filed Vitals:   11/03/15 1123  BP: 99/62  Pulse: 70  Temp: 97.3 F (36.3 C)  Resp: 20  SpO2: 93%   There is no weight on file to calculate BMI. Physical Exam  Constitutional: She is oriented to person, place, and time. No distress.  Cardiovascular: Normal rate and regular rhythm.   No murmur heard. Pulmonary/Chest: Effort normal and breath sounds normal.  Abdominal: Soft. Bowel sounds are normal.  Neurological: She is alert and oriented to person, place, and time.  Skin: Skin is warm and dry. She is not diaphoretic.  Right forearm with skin tears approximated with steristrips. Mild erythema and swelling noted. Drainage noted on gauze, no odor. There are two small pustules forming beside the wound.  Minimal tenderness to the surrounding wound only  Psychiatric: She has a normal mood and affect.    Labs reviewed:  Recent Labs  12/14/14 1238  NA 142  K 4.1  CL 103  CO2 30  GLUCOSE 87  BUN 26*  CREATININE 1.02  CALCIUM 10.5   No results for input(s): AST, ALT, ALKPHOS, BILITOT, PROT, ALBUMIN in the last 8760 hours.  Recent Labs  12/14/14 1238  WBC 7.5  NEUTROABS 5.4  HGB 11.8*  HCT 35.0*  MCV 86.4   PLT 234.0   Lab Results  Component Value Date   TSH 2.73 12/01/2014   Lab Results  Component Value Date   HGBA1C 5.1 01/27/2014   Lab Results  Component Value Date   CHOL 178 01/27/2014   HDL 93* 01/27/2014   LDLCALC 74 01/27/2014   TRIG 41 01/27/2014    Significant Diagnostic Results in last 30 days:  No results found.  Assessment/Plan 1. Infected skin tear Keflex 500 mg TID for 7 days Continue with dressing changes by wound care Staff to monitor wound and VS and report if no improvmement   Cindi Carbon, [place] [phone]

## 2015-12-14 ENCOUNTER — Non-Acute Institutional Stay: Payer: Medicare Other | Admitting: Adult Health

## 2015-12-14 DIAGNOSIS — R059 Cough, unspecified: Secondary | ICD-10-CM | POA: Insufficient documentation

## 2015-12-14 DIAGNOSIS — R05 Cough: Secondary | ICD-10-CM | POA: Insufficient documentation

## 2015-12-14 NOTE — Progress Notes (Signed)
Patient ID: Tara Garrett, female   DOB: 06-Feb-1923, 82 y.o.   MRN: [id number]  Location:   [facility]   Place of Service:  SNF (31) Provider:   Cindi Carbon, ANP [facility] [phone]   REED, Jonelle Sidle, DO  Patient Care Team: [name], DO as PCP - General (Geriatric Medicine) [name], MD (Orthopedic Surgery) [name], MD (Ophthalmology) [facility] [name], MD as Consulting Physician (Cardiology)  Extended Emergency Contact Information Primary Emergency Contact: [name],B J Address: [address]          [name] [address] [name] of [place] Phone: [phone] Mobile Phone: [phone] Relation: Friend  Code Status:  DNR Goals of care: Advanced Directive information Advanced Directives 09/27/2015  Does patient have an advance directive? Yes  Type of Paramedic of [place];Living will;Out of facility DNR (pink MOST or yellow form)  Copy of advanced directive(s) in chart? Yes  Pre-existing out of facility DNR order (yellow form or pink MOST form) Yellow form placed in chart (order not valid for inpatient use);Pink MOST form placed in chart (order not valid for inpatient use)     Chief Complaint  Patient presents with  . Acute Visit  Cough  HPI:  Pt is a 82 y.o. female seen today for an acute visit for cough X 2 days. No report of fever, nausea, vomiting, rhinorrhea, or sore throat. Reports the cough as somewhat productive which does not keep her up at night. Denies shortness of breath. History of right upper lobe infiltrate that was concerning for cancer in the setting of weight loss. Respiratory status improved, and weight loss stabilized. Discharged from hospice. Uses oxygen at night. No reports of orthopnea.    Past Medical History  Diagnosis Date  . Varicose veins   . Hypothyroidism   . Vitamin D deficiency   . Hyperlipidemia   . Hypertension   . Shoulder pain, right 10/2009    X- rayed at Dr. Berenice Primas.Severe Degenerative Disease. Recommended an artificial shoulder. Saw Dr. Tamera Punt .  Marland Kitchen Right shoulder injury 12/2009  . Leg pain   . Hyperglycemia   . Macular degeneration   . Keratosis seborrheica   . Left knee pain   . Osteoporosis   . Neoplasm of breast     right breast cancer  . Fall 10-09-2011    fell outdoors on driveway fell and hit right side of body and face  . Pain in joint, pelvic region and thigh 05/11/2012  . Pain in joint, site unspecified 02/03/39  . Phlebitis and thrombophlebitis of other deep vessels of lower extremities 01/06/2012  . Other malaise and fatigue 01/06/2012  . Closed fracture of two ribs 10/21/2011, 08/24/2013  . Other specified circulatory system disorders 10/09/2011  . Open wound of knee, leg (except thigh), and ankle, without mention of complication [id number]  . Closed fracture of unspecified part of upper end of humerus 01/13/2005  . Female stress incontinence 02/10/2004  . Herpes zoster without mention of complication [id number]  . Malignant neoplasm of breast (female), unspecified site   . Varicose vein 2014  . Multiple fractures of ribs of left side 08/24/2013  . Gait disorder 08/25/2013    Poor balance    Past Surgical History  Procedure Laterality Date  . Left cataract  1999  . Right mastectomy  1989  . Vesicovaginal fistula closure w/ tah  1967  . Colon polyps  1989, 1992, 1995, 1997  . Cataract extraction  04/2004  Dr. Bing Plume  . Mole removal  03/04/2009    on back by Dr. Derrel Nip  . Endovenous ablation saphenous vein w/ laser  01-30-2012    left greater saphenous vein and sclerotherapy left leg  . Abdominal hysterectomy  1967    Allergies  Allergen Reactions  . Levaquin [Levofloxacin In D5w] Diarrhea  . Mobic [Meloxicam]     Blood pressure goes up  . Tramadol     Blood pressure goes up       Medication List       This list is accurate as of: 12/14/15 12:29 PM.  Always use your most recent med list.                acetaminophen 500 MG tablet  Commonly known as:  TYLENOL  Take 500 mg by mouth every 6 (six) hours as needed (for pain).     albuterol 0.63 MG/3ML nebulizer solution  Commonly known as:  ACCUNEB     aspirin 325 MG EC tablet  Take 325 mg by mouth once a week. On Tuesdays     BOOST BREEZE PO  Take by mouth. One can twice daily     CALCIUM 500 +D 500-400 MG-UNIT Tabs  Generic drug:  Calcium Carb-Cholecalciferol  Take 1 tablet by mouth daily.     carvedilol 3.125 MG tablet  Commonly known as:  COREG  Take 1 tablet (3.125 mg total) by mouth 2 (two) times daily with a meal.     cholecalciferol 1000 units tablet  Commonly known as:  VITAMIN D  Take 2,000 Units by mouth daily.     furosemide 80 MG tablet  Commonly known as:  LASIX  Take 80 mg by mouth as directed. 80 mg in the morning     levothyroxine 25 MCG tablet  Commonly known as:  SYNTHROID, LEVOTHROID  Take 1 tablet by mouth daily.     loperamide 2 MG capsule  Commonly known as:  IMODIUM  Take by mouth. Take 2 tablets for each loose stool, max 6 in 24 hour     losartan 25 MG tablet  Commonly known as:  COZAAR  Take 25 mg by mouth daily.     Melatonin 10 MG Tabs  Take 10 mg by mouth daily. One at bedtime     potassium chloride SA 20 MEQ tablet  Commonly known as:  K-DUR,KLOR-CON  Take 40 mEq by mouth daily.     sertraline 50 MG tablet  Commonly known as:  ZOLOFT     TESSALON PERLES 100 MG capsule  Generic drug:  benzonatate  Take by mouth. One every 8 hours as needed for cough. One at bedtime        Review of Systems  Constitutional: Negative for fever, chills, activity change, appetite change and fatigue.  HENT: Negative for congestion, ear pain, postnasal drip, sneezing, sore throat and trouble swallowing.   Eyes: Negative.   Respiratory: Positive for cough. Negative for chest tightness, shortness of breath and wheezing.   Cardiovascular: Negative for chest pain, palpitations and leg swelling.    Gastrointestinal: Negative.  Negative for diarrhea and constipation.  Musculoskeletal: Positive for gait problem. Negative for myalgias, back pain, joint swelling and arthralgias.  Psychiatric/Behavioral: Positive for behavioral problems and confusion. Negative for agitation.    Immunization History  Administered Date(s) Administered  . Influenza Whole 03/10/2012   Pertinent  Health Maintenance Due  Topic Date Due  . PNA vac Low Risk Adult (1 of 2 - PCV13)  08/24/1989  . INFLUENZA VACCINE  01/09/2016  . DEXA SCAN  Completed   Fall Risk  09/27/2015 04/11/2014  Falls in the past year? No Yes  Number falls in past yr: - 1  Injury with Fall? - Yes   Functional Status Survey:    Filed Vitals:   12/14/15 1218  BP: 108/60  Pulse: 74  Temp: 97.8 F (36.6 C)  Resp: 18  Weight: 107 lb 9.6 oz (48.807 kg)  SpO2: 92%   Body mass index is 23.28 kg/(m^2).   Physical Exam  Constitutional: She is oriented to person, place, and time. No distress.  HENT:  Nose: No mucosal edema or rhinorrhea. Right sinus exhibits no maxillary sinus tenderness and no frontal sinus tenderness. Left sinus exhibits no maxillary sinus tenderness and no frontal sinus tenderness.  Mouth/Throat: Oropharynx is clear and moist and mucous membranes are normal. No posterior oropharyngeal edema or posterior oropharyngeal erythema.  Cardiovascular: Normal rate and regular rhythm.   Murmur heard. No lower extremity edema.  Pulmonary/Chest:  Rhonchi auscultated throughout lung fields. Cleared with cough.   Neurological: She is alert and oriented to person, place, and time.  Skin: Skin is warm and dry. She is not diaphoretic.  Pink nail beds with <2 sec capillary refill  Psychiatric: She has a normal mood and affect.    Labs reviewed:  Recent Labs  12/14/14 1238  NA 142  K 4.1  CL 103  CO2 30  GLUCOSE 87  BUN 26*  CREATININE 1.02  CALCIUM 10.5   No results for input(s): AST, ALT, ALKPHOS, BILITOT, PROT,  ALBUMIN in the last 8760 hours.  Recent Labs  12/14/14 1238  WBC 7.5  NEUTROABS 5.4  HGB 11.8*  HCT 35.0*  MCV 86.4  PLT 234.0   Lab Results  Component Value Date   TSH 2.73 12/01/2014   Lab Results  Component Value Date   HGBA1C 5.1 01/27/2014   Lab Results  Component Value Date   CHOL 178 01/27/2014   HDL 93* 01/27/2014   LDLCALC 74 01/27/2014   TRIG 41 01/27/2014    Significant Diagnostic Results in last 30 days:  No results found.  Wt Readings from Last 3 Encounters:  12/14/15 107 lb 9.6 oz (48.807 kg)  09/27/15 107 lb (48.535 kg)  03/01/15 100 lb (45.36 kg)   Assessment/Plan 1. Cough Weight stable over past 3 months with no lower extremity edema or complaints of SOB. Unlikely CHF exacerbation. Continues to utilize oxygen at night. Will monitor and reevaluate when xray report available. -2 view chest xray to evaluate for pneumonia -D/C Albuterol -DuoNebs TID X 7 days   Cindi Carbon, [place] [phone]

## 2016-01-24 ENCOUNTER — Encounter: Payer: Self-pay | Admitting: Internal Medicine

## 2016-01-24 ENCOUNTER — Non-Acute Institutional Stay: Payer: Medicare Other | Admitting: Internal Medicine

## 2016-01-24 VITALS — BP 110/60 | HR 78 | Temp 98.4°F | Ht <= 58 in | Wt 110.0 lb

## 2016-01-24 DIAGNOSIS — F329 Major depressive disorder, single episode, unspecified: Secondary | ICD-10-CM

## 2016-01-24 DIAGNOSIS — I5022 Chronic systolic (congestive) heart failure: Secondary | ICD-10-CM | POA: Diagnosis not present

## 2016-01-24 DIAGNOSIS — M810 Age-related osteoporosis without current pathological fracture: Secondary | ICD-10-CM | POA: Diagnosis not present

## 2016-01-24 DIAGNOSIS — R918 Other nonspecific abnormal finding of lung field: Secondary | ICD-10-CM | POA: Diagnosis not present

## 2016-01-24 DIAGNOSIS — E039 Hypothyroidism, unspecified: Secondary | ICD-10-CM | POA: Diagnosis not present

## 2016-01-24 DIAGNOSIS — F32A Depression, unspecified: Secondary | ICD-10-CM

## 2016-01-24 DIAGNOSIS — F0390 Unspecified dementia without behavioral disturbance: Secondary | ICD-10-CM

## 2016-01-24 NOTE — Progress Notes (Signed)
Location:   [facility] of Service:  Clinic (12)  Provider: Deana Krock L. Mariea Clonts, D.O., C.M.D.  Code Status: DNR Goals of Care:  Advanced Directives 01/24/2016  Does patient have an advance directive? Yes  Type of Advance Directive Out of facility DNR (pink MOST or yellow form);[place];Living will  Does patient want to make changes to advanced directive? -  Copy of advanced directive(s) in chart? Yes  Pre-existing out of facility DNR order (yellow form or pink MOST form) Yellow form placed in chart (order not valid for inpatient use);Pink MOST form placed in chart (order not valid for inpatient use)     Chief Complaint  Patient presents with  . Medical Management of Chronic Issues    48mth follow-up    HPI: Patient is a 82 y.o. female seen today for medical management of chronic diseases.    She has gained 3 more lbs in the past 4 months.  She was discharged from hospice which she was on for possible lung cancer/tumor in RUL.    She is going to the weightlifting class which is easy.   Uses 2 lbs for right and 4 lbs for left.  She has no scar from the fall.    No pain.  Shoulder sore and doesn't move as well, but normally it doesn't bother her either.  Sleeping well.    She has gained three more lbs since April.  No increase in edema or shortness of breath.  Feels well.  Past Medical History:  Diagnosis Date  . Closed fracture of two ribs 10/21/2011, 08/24/2013  . Closed fracture of unspecified part of upper end of humerus 01/13/2005  . Fall 10-09-2011   fell outdoors on driveway fell and hit right side of body and face  . Female stress incontinence 02/10/2004  . Gait disorder 08/25/2013   Poor balance   . Herpes zoster without mention of complication [id number]  . Hyperglycemia   . Hyperlipidemia   . Hypertension   . Hypothyroidism   . Keratosis seborrheica   . Left knee pain   . Leg pain   . Macular degeneration   . Malignant neoplasm of  breast (female), unspecified site   . Multiple fractures of ribs of left side 08/24/2013  . Neoplasm of breast    right breast cancer  . Open wound of knee, leg (except thigh), and ankle, without mention of complication [id number]  . Osteoporosis   . Other malaise and fatigue 01/06/2012  . Other specified circulatory system disorders 10/09/2011  . Pain in joint, pelvic region and thigh 05/11/2012  . Pain in joint, site unspecified 02/03/39  . Phlebitis and thrombophlebitis of other deep vessels of lower extremities 01/06/2012  . Right shoulder injury 12/2009  . Shoulder pain, right 10/2009   X- rayed at Dr. Berenice Primas.Severe Degenerative Disease. Recommended an artificial shoulder. Saw Dr. Tamera Punt .  Marland Kitchen Varicose vein 2014  . Varicose veins   . Vitamin D deficiency     Past Surgical History:  Procedure Laterality Date  . ABDOMINAL HYSTERECTOMY  1967  . CATARACT EXTRACTION  04/2004   Dr. Bing Plume  . colon polyps  1989, 1992, 1995, 1997  . ENDOVENOUS ABLATION SAPHENOUS VEIN W/ LASER  01-30-2012   left greater saphenous vein and sclerotherapy left leg  . left cataract  1999  . MOLE REMOVAL  03/04/2009   on back by Dr. Derrel Nip  . right mastectomy  1989  . VESICOVAGINAL FISTULA CLOSURE W/ TAH  1967    Allergies  Allergen Reactions  . Levaquin [Levofloxacin In D5w] Diarrhea  . Mobic [Meloxicam]     Blood pressure goes up  . Tramadol     Blood pressure goes up       Medication List       Accurate as of 01/24/16 11:37 AM. Always use your most recent med list.          acetaminophen 500 MG tablet Commonly known as:  TYLENOL Take 500 mg by mouth every 6 (six) hours as needed (for pain).   aspirin 325 MG EC tablet Take 325 mg by mouth once a week. On Tuesdays   BOOST BREEZE PO Take by mouth. One can twice daily   CALCIUM 500 +D 500-400 MG-UNIT Tabs Generic drug:  Calcium Carb-Cholecalciferol Take 1 tablet by mouth daily.   carvedilol 3.125 MG tablet Commonly known as:   COREG Take 1 tablet (3.125 mg total) by mouth 2 (two) times daily with a meal.   cholecalciferol 1000 units tablet Commonly known as:  VITAMIN D Take 2,000 Units by mouth daily.   furosemide 80 MG tablet Commonly known as:  LASIX Take 80 mg by mouth as directed. 80 mg in the morning   levothyroxine 25 MCG tablet Commonly known as:  SYNTHROID, LEVOTHROID Take 1 tablet by mouth daily.   loperamide 2 MG capsule Commonly known as:  IMODIUM Take 2 tablets for each loose stool, max 6 in 24 hour   losartan 25 MG tablet Commonly known as:  COZAAR Take 25 mg by mouth daily.   Melatonin 10 MG Tabs Take 10 mg by mouth daily. One at bedtime   potassium chloride SA 20 MEQ tablet Commonly known as:  K-DUR,KLOR-CON Take 40 mEq by mouth daily.   sertraline 50 MG tablet Commonly known as:  ZOLOFT Take 50 mg by mouth daily.   TESSALON PERLES 100 MG capsule Generic drug:  benzonatate Take by mouth. One every 8 hours as needed for cough. One at bedtime       Review of Systems:  Review of Systems  Constitutional: Negative for chills, fever, malaise/fatigue and weight loss.  HENT: Positive for hearing loss.   Eyes: Positive for blurred vision.  Respiratory: Negative for cough and shortness of breath.   Cardiovascular: Negative for chest pain, palpitations and leg swelling.  Gastrointestinal: Negative for abdominal pain, blood in stool, constipation, diarrhea, heartburn and melena.  Genitourinary: Positive for frequency and urgency. Negative for dysuria, flank pain and hematuria.       Incontinence  Musculoskeletal: Positive for joint pain. Negative for falls and myalgias.  Skin: Negative for itching and rash.  Neurological: Negative for weakness.    Health Maintenance  Topic Date Due  . TETANUS/TDAP  08/25/1943  . ZOSTAVAX  08/24/1984  . PNA vac Low Risk Adult (1 of 2 - PCV13) 08/24/1989  . INFLUENZA VACCINE  01/09/2016  . DEXA SCAN  Completed    Physical Exam: Vitals:    01/24/16 1133  BP: 110/60  Pulse: 78  Temp: 98.4 F (36.9 C)  TempSrc: Oral  SpO2: 96%  Weight: 110 lb (49.9 kg)  Height: 4\' 9"  (1.448 m)   Body mass index is 23.8 kg/m. Physical Exam  Constitutional: She appears well-developed and well-nourished. No distress.  Cardiovascular: Intact distal pulses.   irreg irreg  Pulmonary/Chest: Effort normal.  Diminished breath sounds RUL  Abdominal: Soft. Bowel sounds are normal.  Musculoskeletal: Normal range of motion.  Neurological: She is alert.  Oriented to person and place, remembers me from before her memory problems started  Skin: Skin is warm and dry. There is pallor.  Psychiatric: She has a normal mood and affect.    Labs reviewed: Basic Metabolic Panel:  Recent Labs  09/28/15 0300  NA 139  K 4.1  BUN 27*  CREATININE 0.9  TSH 2.71   Liver Function Tests:  Recent Labs  09/28/15 0300  AST 14  ALT 5*  ALKPHOS 58   No results for input(s): LIPASE, AMYLASE in the last 8760 hours. No results for input(s): AMMONIA in the last 8760 hours. CBC:  Recent Labs  09/28/15 0300  WBC 6.8  HGB 10.3*  HCT 29*  PLT 191   Lipid Panel: No results for input(s): CHOL, HDL, LDLCALC, TRIG, CHOLHDL, LDLDIRECT in the last 8760 hours. Lab Results  Component Value Date   HGBA1C 5.1 01/27/2014   Assessment/Plan 1. Chronic systolic congestive heart failure (HCC) -has been stable -has gained weight but it appears this is due to better eating and not drinking alcohol in excess -cont current regimen and monitor  2. Hypothyroidism (acquired) -tsh stable on her levothyroxine  3. Senile osteoporosis -continues on ca with D and additional D -since she graduated hospice, might consider a reassessment and treating her OP with meds in the future  4. Depression -has done wonderfully with zoloft--she had been very depressed when she first got ill with her chf and RUL mass -I'm hesitant to change this at this point due to how she is  eating and drinking well, participating in activities, spirits are great  5. Mass of lung -still has diminshed breath sounds in RUL, but weight has stabilized, in fact gone up and she "graduated" hospice care, cont to monitor  6. Dementia, without behavioral disturbance -memory actually seems a little better also recently per her friend/HCPOA, not on memory meds as goal has been comfort considering her comorbidities  Labs/tests ordered:   Orders Placed This Encounter  Procedures  . CBC and differential    This external order was created through the Results Console.  . Basic metabolic panel    This external order was created through the Results Console.  . Hepatic function panel    This external order was created through the Results Console.  . TSH    This external order was created through the Results Console.    Next appt:  05/22/2016  Brettney Ficken L. Demarie Uhlig, D.O. [facility] [address], [place] [address] Cell Phone (Mon-Fri 8am-5pm):  [phone] On Call:  [phone] & follow prompts after 5pm & weekends Office Phone:  [phone] Office Fax:  [phone]

## 2016-03-08 ENCOUNTER — Telehealth: Payer: Self-pay

## 2016-03-08 NOTE — Telephone Encounter (Signed)
A letter was received from [facility] [place] that stated the quantity limitation request for Sertraline HCI 50 mg OR tabs was approved.   Approval will end on 03/05/17. A new request will have to be submitted after that date.   Any questions should be directed to the Customer Service line at [phone] for HMO and [phone] for PPO members, 7 days a week from 8 am to 8 pm.   Letter was placed in Dr. Cyndi Lennert folder for review.

## 2016-05-22 ENCOUNTER — Encounter: Payer: Self-pay | Admitting: Internal Medicine

## 2016-07-03 ENCOUNTER — Encounter: Payer: Self-pay | Admitting: Internal Medicine

## 2016-07-10 ENCOUNTER — Non-Acute Institutional Stay: Payer: Medicare Other | Admitting: Internal Medicine

## 2016-07-10 ENCOUNTER — Encounter: Payer: Self-pay | Admitting: Internal Medicine

## 2016-07-10 VITALS — BP 120/60 | HR 75 | Temp 97.6°F | Wt 102.0 lb

## 2016-07-10 DIAGNOSIS — F01518 Vascular dementia, unspecified severity, with other behavioral disturbance: Secondary | ICD-10-CM

## 2016-07-10 DIAGNOSIS — R634 Abnormal weight loss: Secondary | ICD-10-CM | POA: Diagnosis not present

## 2016-07-10 DIAGNOSIS — F0151 Vascular dementia with behavioral disturbance: Secondary | ICD-10-CM | POA: Diagnosis not present

## 2016-07-10 DIAGNOSIS — F3342 Major depressive disorder, recurrent, in full remission: Secondary | ICD-10-CM | POA: Diagnosis not present

## 2016-07-10 DIAGNOSIS — I5022 Chronic systolic (congestive) heart failure: Secondary | ICD-10-CM

## 2016-07-10 DIAGNOSIS — R918 Other nonspecific abnormal finding of lung field: Secondary | ICD-10-CM | POA: Diagnosis not present

## 2016-07-10 DIAGNOSIS — E039 Hypothyroidism, unspecified: Secondary | ICD-10-CM

## 2016-07-10 NOTE — Progress Notes (Signed)
Location:  [facility] Room Number: assisted living Place of Service:  Clinic (12)  Provider: Everrett Lacasse L. Mariea Clonts, D.O., C.M.D.  Code Status: DNR Goals of Care:  Advanced Directives 07/10/2016  Does Patient Have a Medical Advance Directive? Yes  Type of Paramedic of [place];Living will;Out of facility DNR (pink MOST or yellow form)  Does patient want to make changes to medical advance directive? No - Patient declined  Copy of [place] in Chart? Yes  Pre-existing out of facility DNR order (yellow form or pink MOST form) Yellow form placed in chart (order not valid for inpatient use);Pink MOST form placed in chart (order not valid for inpatient use)     Chief Complaint  Patient presents with  . Medical Management of Chronic Issues    24mth follow-up    HPI: Patient is a 83 y.o. female seen today for medical management of chronic diseases.    She feels she is doing well and has no complaints herself.  Denies pain.  Spirits are good.  I had heard about an episode where she was not wanting to receive her personal care and bit her caregiver's breast, but her friend who was along for the appt clearly did not want that brought up.  Pt also got very angry and called security when staff cleaned the old food out of her refrigerator, claiming she'd been robbed.  Of course, she does have some dementia though other residents think she is cognitively intact.    Her friend is concerned about her weight loss.  She has been trending down.  It seems that the weight in the 90s in AL was likely incorrect.  We discussed increasing intake and more regular use of ensure or boost supplements.  These are already ordered.  She likes more savory than sweet flavors, unfortunately, which makes it more challenging.  She had been on hospice care for suspected lung cancer (had a postobstructive pneumonia and a ton of weight loss in the context  of heart failure, depression, and alcohol abuse also prior to and at the beginning of her AL stay).  She graduated after she gained weight, intake was great and she was stable for a long time.    Past Medical History:  Diagnosis Date  . Closed fracture of two ribs 10/21/2011, 08/24/2013  . Closed fracture of unspecified part of upper end of humerus 01/13/2005  . Fall 10-09-2011   fell outdoors on driveway fell and hit right side of body and face  . Female stress incontinence 02/10/2004  . Gait disorder 08/25/2013   Poor balance   . Herpes zoster without mention of complication [id number]  . Hyperglycemia   . Hyperlipidemia   . Hypertension   . Hypothyroidism   . Keratosis seborrheica   . Left knee pain   . Leg pain   . Macular degeneration   . Malignant neoplasm of breast (female), unspecified site   . Multiple fractures of ribs of left side 08/24/2013  . Neoplasm of breast    right breast cancer  . Open wound of knee, leg (except thigh), and ankle, without mention of complication [id number]  . Osteoporosis   . Other malaise and fatigue 01/06/2012  . Other specified circulatory system disorders 10/09/2011  . Pain in joint, pelvic region and thigh 05/11/2012  . Pain in joint, site unspecified 02/03/39  . Phlebitis and thrombophlebitis of other deep vessels of lower extremities 01/06/2012  . Right shoulder injury  12/2009  . Shoulder pain, right 10/2009   X- rayed at Dr. Berenice Primas.Severe Degenerative Disease. Recommended an artificial shoulder. Saw Dr. Tamera Punt .  Marland Kitchen Varicose vein 2014  . Varicose veins   . Vitamin D deficiency     Past Surgical History:  Procedure Laterality Date  . ABDOMINAL HYSTERECTOMY  1967  . CATARACT EXTRACTION  04/2004   Dr. Bing Plume  . colon polyps  1989, 1992, 1995, 1997  . ENDOVENOUS ABLATION SAPHENOUS VEIN W/ LASER  01-30-2012   left greater saphenous vein and sclerotherapy left leg  . left cataract  1999  . MOLE REMOVAL  03/04/2009   on back by Dr. Derrel Nip  .  right mastectomy  1989  . VESICOVAGINAL FISTULA CLOSURE W/ TAH  1967    Allergies  Allergen Reactions  . Levaquin [Levofloxacin In D5w] Diarrhea  . Mobic [Meloxicam]     Blood pressure goes up  . Tramadol     Blood pressure goes up     Allergies as of 07/10/2016      Reactions   Levaquin [levofloxacin In D5w] Diarrhea   Mobic [meloxicam]    Blood pressure goes up   Tramadol    Blood pressure goes up       Medication List       Accurate as of 07/10/16  4:26 PM. Always use your most recent med list.          aspirin 325 MG EC tablet Take 325 mg by mouth once a week. On Tuesdays   BOOST BREEZE PO Take by mouth. One can twice daily   CALCIUM 500 +D 500-400 MG-UNIT Tabs Generic drug:  Calcium Carb-Cholecalciferol Take 1 tablet by mouth daily.   carvedilol 3.125 MG tablet Commonly known as:  COREG Take 1 tablet (3.125 mg total) by mouth 2 (two) times daily with a meal.   cholecalciferol 1000 units tablet Commonly known as:  VITAMIN D Take 2,000 Units by mouth daily.   furosemide 80 MG tablet Commonly known as:  LASIX Take 80 mg by mouth as directed. 80 mg in the morning   levothyroxine 25 MCG tablet Commonly known as:  SYNTHROID, LEVOTHROID Take 1 tablet by mouth daily.   losartan 25 MG tablet Commonly known as:  COZAAR Take 25 mg by mouth daily.   Melatonin 10 MG Tabs Take 10 mg by mouth daily. One at bedtime   potassium chloride SA 20 MEQ tablet Commonly known as:  K-DUR,KLOR-CON Take 40 mEq by mouth daily.   sertraline 50 MG tablet Commonly known as:  ZOLOFT Take 50 mg by mouth daily.   TESSALON PERLES 100 MG capsule Generic drug:  benzonatate Take by mouth. One every 8 hours as needed for cough. One at bedtime       Review of Systems:  Review of Systems  Constitutional: Positive for weight loss. Negative for chills, fever and malaise/fatigue.  HENT: Negative for hearing loss.   Eyes: Negative for blurred vision.  Respiratory: Negative for  cough, shortness of breath and wheezing.   Cardiovascular: Negative for chest pain, palpitations and leg swelling.  Gastrointestinal: Negative for abdominal pain, blood in stool, constipation, diarrhea and melena.  Genitourinary: Negative for dysuria.  Musculoskeletal: Negative for falls and myalgias.       Walks with walker  Skin: Negative for itching and rash.  Neurological: Negative for dizziness, loss of consciousness and weakness.  Endo/Heme/Allergies: Bruises/bleeds easily.  Psychiatric/Behavioral: Positive for memory loss. Negative for depression, hallucinations and substance abuse. The patient  is not nervous/anxious.     Health Maintenance  Topic Date Due  . TETANUS/TDAP  08/[birth year]/1943  . ZOSTAVAX  08/24/1984  . PNA vac Low Risk Adult (1 of 2 - PCV13) 08/24/1989  . INFLUENZA VACCINE  09/07/2016 (Originally 01/09/2016)  . DEXA SCAN  Completed    Physical Exam: Vitals:   07/10/16 1554  BP: 120/60  Pulse: 75  Temp: 97.6 F (36.4 C)  TempSrc: Oral  SpO2: 93%  Weight: 102 lb (46.3 kg)   Body mass index is 22.07 kg/m. Physical Exam  Constitutional: She is oriented to person, place, and time. No distress.  Has visibly lost weight since last visit 6 mos ago  HENT:  Head: Normocephalic and atraumatic.  Cardiovascular: Normal rate, regular rhythm and intact distal pulses.   Murmur heard. Pulmonary/Chest: Effort normal and breath sounds normal. No respiratory distress. She has no wheezes. She has no rales.  Abdominal: Bowel sounds are normal.  Musculoskeletal: Normal range of motion.  Walks with walker  Neurological: She is alert and oriented to person, place, and time.  Short term memory loss, poor historian--recalls things differently and associates them with incorrect situations  Skin: Skin is warm and dry. There is pallor.    Labs reviewed: Basic Metabolic Panel:  Recent Labs  09/28/15 0300  NA 139  K 4.1  BUN 27*  CREATININE 0.9  TSH 2.71   Liver Function  Tests:  Recent Labs  09/28/15 0300  AST 14  ALT 5*  ALKPHOS 58   No results for input(s): LIPASE, AMYLASE in the last 8760 hours. No results for input(s): AMMONIA in the last 8760 hours. CBC:  Recent Labs  09/28/15 0300  WBC 6.8  HGB 10.3*  HCT 29*  PLT 191   Lipid Panel: No results for input(s): CHOL, HDL, LDLCALC, TRIG, CHOLHDL, LDLDIRECT in the last 8760 hours. Lab Results  Component Value Date   HGBA1C 5.1 01/27/2014   Assessment/Plan 1. Weight loss -noted -may be due to lung mass progressing vs generalized frailty -will need to monitor how much she is eating at meals and also encourage boost/ensure supplements and other higher protein food choices  2. Vascular dementia with behavioral disturbance -having some more behaviors lately with the episode with her fridge and biting her caregiver's breast -we had stopped meds for this when she was on hospice before for the lung mass--cont off  3. Hypothyroidism (acquired) -cont current levothyroxine Lab Results  Component Value Date   TSH 2.71 09/28/2015    4. Recurrent major depressive disorder, in full remission ([place]) -stable on zoloft low dose of 50mg  daily==cont same and monitor  5. Chronic systolic congestive heart failure (HCC) -no evidence of progression or exacerbations -continues on losartan, coreg, lasix and potassium for this, full dose asa  6. Mass of lung -suspect this may be progressing leading to weight loss--if this continues despite encouragement for more po intake and supplements, might call hospice back to reassess, not sure that reimaging will make sense   Next appt:  09/25/2016   Imojean Yoshino L. Lauriann Milillo, D.O. [facility] [address], [place] [address] Cell Phone (Mon-Fri 8am-5pm):  [phone] On Call:  [phone] & follow prompts after 5pm & weekends Office Phone:  [phone] Office Fax:  [phone]

## 2016-07-14 DIAGNOSIS — R634 Abnormal weight loss: Secondary | ICD-10-CM | POA: Insufficient documentation

## 2016-07-14 DIAGNOSIS — F01518 Vascular dementia, unspecified severity, with other behavioral disturbance: Secondary | ICD-10-CM | POA: Insufficient documentation

## 2016-07-14 DIAGNOSIS — F0151 Vascular dementia with behavioral disturbance: Secondary | ICD-10-CM | POA: Insufficient documentation

## 2016-08-01 ENCOUNTER — Non-Acute Institutional Stay: Payer: Medicare Other | Admitting: Adult Health

## 2016-08-01 ENCOUNTER — Encounter: Payer: Self-pay | Admitting: Adult Health

## 2016-08-01 DIAGNOSIS — R059 Cough, unspecified: Secondary | ICD-10-CM

## 2016-08-01 DIAGNOSIS — R918 Other nonspecific abnormal finding of lung field: Secondary | ICD-10-CM | POA: Diagnosis not present

## 2016-08-01 DIAGNOSIS — R05 Cough: Secondary | ICD-10-CM | POA: Diagnosis not present

## 2016-08-01 LAB — BASIC METABOLIC PANEL
BUN: 19 mg/dL (ref 4–21)
Creatinine: 0.8 mg/dL (ref 0.5–1.1)
Glucose: 86 mg/dL
Potassium: 4.3 mmol/L (ref 3.4–5.3)
Sodium: 136 mmol/L — AB (ref 137–147)

## 2016-08-01 LAB — CBC AND DIFFERENTIAL
HCT: 36 % (ref 36–46)
Hemoglobin: 11.6 g/dL — AB (ref 12.0–16.0)
Platelets: 266 10*3/uL (ref 150–399)
WBC: 8 10^3/mL

## 2016-08-01 LAB — TSH: TSH: 3.01 u[IU]/mL (ref 0.41–5.90)

## 2016-08-05 DIAGNOSIS — R918 Other nonspecific abnormal finding of lung field: Secondary | ICD-10-CM | POA: Insufficient documentation

## 2016-08-05 NOTE — Progress Notes (Signed)
Location:  [facility]up[facility] of Service:  ALF (13) Provider:   Cindi Carbon, ANP [place] [phone]   REED, Jonelle Sidle, DO  Patient Care Team: [name], DO as PCP - General (Geriatric Medicine) [name], MD (Orthopedic Surgery) [name], MD (Ophthalmology) [facility] [name], MD as Consulting Physician (Cardiology)  Extended Emergency Contact Information Primary Emergency Contact: [name],B J Address: [address]          [name] [address] [name] of [place] Phone: [phone] Mobile Phone: [phone] Relation: Friend  Code Status:  DNR Goals of care: Advanced Directive information Advanced Directives 07/10/2016  Does Patient Have a Medical Advance Directive? Yes  Type of Paramedic of [place];Living will;Out of facility DNR (pink MOST or yellow form)  Does patient want to make changes to medical advance directive? No - Patient declined  Copy of [place] in Chart? Yes  Pre-existing out of facility DNR order (yellow form or pink MOST form) Yellow form placed in chart (order not valid for inpatient use);Pink MOST form placed in chart (order not valid for inpatient use)     Chief Complaint  Patient presents with  . Acute Visit    cough x 2 weeks, sleepy    HPI:  Pt is a 83 y.o. female seen today for an acute visit for cough.  The resident reports that it has been present for two weeks.  She has some yellow sputum production, but no fever, no sob, no cp, or nasal symptoms.  She feels the cough is improved from the day prior but her caregiver reports that it sounds very "congested" and that she has been falling asleep more. She has a hx of right lung opacity (found in 2016). This was not worked up due to her age, goals of care, and debility. She was followed by hospice but seemed to improve so they discharged her.     Past Medical History:    Diagnosis Date  . Closed fracture of two ribs 10/21/2011, 08/24/2013  . Closed fracture of unspecified part of upper end of humerus 01/13/2005  . Fall 10-09-2011   fell outdoors on driveway fell and hit right side of body and face  . Female stress incontinence 02/10/2004  . Gait disorder 08/25/2013   Poor balance   . Herpes zoster without mention of complication [id number]  . Hyperglycemia   . Hyperlipidemia   . Hypertension   . Hypothyroidism   . Keratosis seborrheica   . Left knee pain   . Leg pain   . Macular degeneration   . Malignant neoplasm of breast (female), unspecified site   . Multiple fractures of ribs of left side 08/24/2013  . Neoplasm of breast    right breast cancer  . Open wound of knee, leg (except thigh), and ankle, without mention of complication [id number]  . Osteoporosis   . Other malaise and fatigue 01/06/2012  . Other specified circulatory system disorders 10/09/2011  . Pain in joint, pelvic region and thigh 05/11/2012  . Pain in joint, site unspecified 02/03/39  . Phlebitis and thrombophlebitis of other deep vessels of lower extremities 01/06/2012  . Right shoulder injury 12/2009  . Shoulder pain, right 10/2009   X- rayed at Dr. Berenice Primas.Severe Degenerative Disease. Recommended an artificial shoulder. Saw Dr. Tamera Punt .  Marland Kitchen Varicose vein 2014  . Varicose veins   . Vitamin D deficiency    Past Surgical History:  Procedure  Laterality Date  . ABDOMINAL HYSTERECTOMY  1967  . CATARACT EXTRACTION  04/2004   Dr. Bing Plume  . colon polyps  1989, 1992, 1995, 1997  . ENDOVENOUS ABLATION SAPHENOUS VEIN W/ LASER  01-30-2012   left greater saphenous vein and sclerotherapy left leg  . left cataract  1999  . MOLE REMOVAL  03/04/2009   on back by Dr. Derrel Nip  . right mastectomy  1989  . VESICOVAGINAL FISTULA CLOSURE W/ TAH  1967    Allergies  Allergen Reactions  . Levaquin [Levofloxacin In D5w] Diarrhea  . Mobic [Meloxicam]     Blood pressure goes up  . Tramadol      Blood pressure goes up     Allergies as of 08/01/2016      Reactions   Levaquin [levofloxacin In D5w] Diarrhea   Mobic [meloxicam]    Blood pressure goes up   Tramadol    Blood pressure goes up       Medication List       Accurate as of 08/01/16 11:59 PM. Always use your most recent med list.          aspirin 325 MG EC tablet Take 325 mg by mouth once a week. On Tuesdays   BOOST BREEZE PO Take by mouth. One can twice daily   CALCIUM 500 +D 500-400 MG-UNIT Tabs Generic drug:  Calcium Carb-Cholecalciferol Take 1 tablet by mouth daily.   carvedilol 3.125 MG tablet Commonly known as:  COREG Take 1 tablet (3.125 mg total) by mouth 2 (two) times daily with a meal.   cholecalciferol 1000 units tablet Commonly known as:  VITAMIN D Take 2,000 Units by mouth daily.   furosemide 80 MG tablet Commonly known as:  LASIX Take 80 mg by mouth as directed. 80 mg in the morning   levothyroxine 25 MCG tablet Commonly known as:  SYNTHROID, LEVOTHROID Take 1 tablet by mouth daily.   losartan 25 MG tablet Commonly known as:  COZAAR Take 25 mg by mouth daily.   Melatonin 10 MG Tabs Take 10 mg by mouth daily. One at bedtime   potassium chloride SA 20 MEQ tablet Commonly known as:  K-DUR,KLOR-CON Take 40 mEq by mouth daily.   sertraline 50 MG tablet Commonly known as:  ZOLOFT Take 50 mg by mouth daily.   TESSALON PERLES 100 MG capsule Generic drug:  benzonatate Take by mouth. One every 8 hours as needed for cough. One at bedtime       Review of Systems  Constitutional: Positive for appetite change, fatigue and unexpected weight change. Negative for activity change, chills, diaphoresis and fever.  HENT: Negative for congestion.   Respiratory: Positive for cough and wheezing. Negative for shortness of breath.   Cardiovascular: Negative for chest pain, palpitations and leg swelling.  Gastrointestinal: Negative for abdominal distention, abdominal pain, constipation and  diarrhea.  Genitourinary: Negative for difficulty urinating and dysuria.  Musculoskeletal: Positive for arthralgias and gait problem. Negative for back pain, joint swelling and myalgias.  Neurological: Negative for dizziness, tremors, seizures, syncope, facial asymmetry, speech difficulty, weakness, light-headedness, numbness and headaches.  Psychiatric/Behavioral: Positive for confusion (baseline). Negative for agitation and behavioral problems.    Immunization History  Administered Date(s) Administered  . Influenza Whole 03/10/2012  . Influenza-Unspecified 03/28/2016   Pertinent  Health Maintenance Due  Topic Date Due  . PNA vac Low Risk Adult (1 of 2 - PCV13) 08/24/1989  . INFLUENZA VACCINE  Addressed  . DEXA SCAN  Completed   Fall  Risk  07/10/2016 09/27/2015 04/11/2014  Falls in the past year? No No Yes  Number falls in past yr: - - 1  Injury with Fall? - - Yes   Functional Status Survey:    Vitals:   08/01/16 1017  BP: (!) 94/57  Pulse: 71  Resp: 20  Temp: 97.3 F (36.3 C)  SpO2: 96%  Weight: 105 lb (47.6 kg)   Body mass index is 22.72 kg/m. Physical Exam  Constitutional: She is oriented to person, place, and time. No distress.  HENT:  Head: Normocephalic and atraumatic.  Right Ear: External ear normal.  Left Ear: External ear normal.  Nose: Nose normal.  Mouth/Throat: Oropharynx is clear and moist. No oropharyngeal exudate.  Eyes: Conjunctivae are normal. Pupils are equal, round, and reactive to light. Right eye exhibits no discharge. Left eye exhibits no discharge.  Neck: No JVD present.  Cardiovascular: Normal rate and regular rhythm.   No murmur heard. Pulmonary/Chest: Effort normal. No respiratory distress. She has wheezes (mild, bilat, no rhonchi).  Neurological: She is alert and oriented to person, place, and time.  Skin: Skin is warm and dry. She is not diaphoretic.  Psychiatric: She has a normal mood and affect.    Labs reviewed:  Recent Labs   09/28/15 0300  NA 139  K 4.1  BUN 27*  CREATININE 0.9    Recent Labs  09/28/15 0300  AST 14  ALT 5*  ALKPHOS 58    Recent Labs  09/28/15 0300  WBC 6.8  HGB 10.3*  HCT 29*  PLT 191   Lab Results  Component Value Date   TSH 2.71 09/28/2015   Lab Results  Component Value Date   HGBA1C 5.1 01/27/2014   Lab Results  Component Value Date   CHOL 178 01/27/2014   HDL 93 (A) 01/27/2014   LDLCALC 74 01/27/2014   TRIG 41 01/27/2014    Significant Diagnostic Results in last 30 days:  No results found.  Assessment/Plan 1. Cough 2 view cxr to rule out pna Duonebs TID for 5 days CBC BMP TSH (previously ordered for weight loss) See below  2. Opacity of lung on imaging study She has a hx of chronic right mid lung opacity so this will need to be considered when reviewing her xray She was previously followed by hospice for this issue and no aggressive measures were taken given her age, debility, goals of care    Family/ staff Communication: discussed with resident and staff Labs/tests ordered:  CBC BMP TSH CXR

## 2016-09-25 ENCOUNTER — Encounter: Payer: Self-pay | Admitting: Internal Medicine

## 2016-09-25 ENCOUNTER — Non-Acute Institutional Stay: Payer: Medicare Other | Admitting: Internal Medicine

## 2016-09-25 VITALS — BP 110/60 | HR 76 | Temp 97.9°F | Wt 103.0 lb

## 2016-09-25 DIAGNOSIS — F0151 Vascular dementia with behavioral disturbance: Secondary | ICD-10-CM | POA: Diagnosis not present

## 2016-09-25 DIAGNOSIS — R918 Other nonspecific abnormal finding of lung field: Secondary | ICD-10-CM | POA: Insufficient documentation

## 2016-09-25 DIAGNOSIS — E039 Hypothyroidism, unspecified: Secondary | ICD-10-CM

## 2016-09-25 DIAGNOSIS — I5022 Chronic systolic (congestive) heart failure: Secondary | ICD-10-CM

## 2016-09-25 DIAGNOSIS — F01518 Vascular dementia, unspecified severity, with other behavioral disturbance: Secondary | ICD-10-CM

## 2016-09-25 NOTE — Progress Notes (Signed)
Location:  Occupational psychologist of Service:  Clinic (12)  Provider: [name], D.O., C.M.D.  Code Status: DNR Goals of Care:  Advanced Directives 09/25/2016  Does Patient Have a Medical Advance Directive? Yes  Type of Advance Directive Out of facility DNR (pink MOST or yellow form);Tara Garrett;Living will  Does patient want to make changes to medical advance directive? -  Copy of [place] in Chart? Yes  Pre-existing out of facility DNR order (yellow form or pink MOST form) Yellow form placed in chart (order not valid for inpatient use);Pink MOST form placed in chart (order not valid for inpatient use)     Chief Complaint  Patient presents with  . Medical Management of Chronic Issues    45mth follow-up    HPI: Patient is a 83 y.o. female seen today for medical management of chronic diseases.    Last visit, we were concerned about her weight trending down, but it has trended back up now to 103 lbs.  It's been fluctuating 100-102 past several days in AL.    NP Wert saw her on 2/22 due to concerns about ongoing cough for 2 weeks and sleepiness.  She's had mass identified in 2016 in right lung and was on hospice for presumed lung cancer along with CHF, vascular dementia.  She had been stable with weight for years and got discharged from hospice.  2/22, CXR was repeated to r/o pneumonia, duonebs tid for 5 days, cbc, bmp, tsh pending ordered for weight loss. Wet cough resolved.  No changes since.  Reviewed that area on right lung has grown based on the xray and CT recommended, but will not change management with goals being comfort.  Labs were normal.  No new concerns from friends or patient.    Kept talking about wanting to go without her walker, but encouraged not to experiment with this.  Her friend, BJ and her husband also discouraged attempts to walk w/o the walker.  I reminded Tara Garrett that she's been requiring the walker for  2 years at least.    Past Medical History:  Diagnosis Date  . Closed fracture of two ribs 10/21/2011, 08/24/2013  . Closed fracture of unspecified part of upper end of humerus 01/13/2005  . Fall 10-09-2011   fell outdoors on driveway fell and hit right side of body and face  . Female stress incontinence 02/10/2004  . Gait disorder 08/25/2013   Poor balance   . Herpes zoster without mention of complication 70/62/3762  . Hyperglycemia   . Hyperlipidemia   . Hypertension   . Hypothyroidism   . Keratosis seborrheica   . Left knee pain   . Leg pain   . Macular degeneration   . Malignant neoplasm of breast (female), unspecified site   . Multiple fractures of ribs of left side 08/24/2013  . Neoplasm of breast    right breast cancer  . Open wound of knee, leg (except thigh), and ankle, without mention of complication 02/08/5175  . Osteoporosis   . Other malaise and fatigue 01/06/2012  . Other specified circulatory system disorders 10/09/2011  . Pain in joint, pelvic region and thigh 05/11/2012  . Pain in joint, site unspecified 02/03/39  . Phlebitis and thrombophlebitis of other deep vessels of lower extremities 01/06/2012  . Right shoulder injury 12/2009  . Shoulder pain, right 10/2009   X- rayed at Dr. Berenice Primas.Severe Degenerative Disease. Recommended an artificial shoulder. Saw Dr. Tamera Punt .  Marland Kitchen  Varicose vein 2014  . Varicose veins   . Vitamin D deficiency     Past Surgical History:  Procedure Laterality Date  . ABDOMINAL HYSTERECTOMY  1967  . CATARACT EXTRACTION  04/2004   Dr. Bing Plume  . colon polyps  1989, 1992, 1995, 1997  . ENDOVENOUS ABLATION SAPHENOUS VEIN W/ LASER  01-30-2012   left greater saphenous vein and sclerotherapy left leg  . left cataract  1999  . MOLE REMOVAL  03/04/2009   on back by Dr. Derrel Nip  . right mastectomy  1989  . VESICOVAGINAL FISTULA CLOSURE W/ TAH  1967    Allergies  Allergen Reactions  . Levaquin [Levofloxacin In D5w] Diarrhea  . Mobic [Meloxicam]      Blood pressure goes up  . Tramadol     Blood pressure goes up     Allergies as of 09/25/2016      Reactions   Levaquin [levofloxacin In D5w] Diarrhea   Mobic [meloxicam]    Blood pressure goes up   Tramadol    Blood pressure goes up       Medication List       Accurate as of 09/25/16  2:02 PM. Always use your most recent med list.          aspirin 325 MG EC tablet Take 325 mg by mouth once a week. On Tuesdays   BOOST BREEZE PO Take by mouth. One can twice daily   CALCIUM 500 +D 500-400 MG-UNIT Tabs Generic drug:  Calcium Carb-Cholecalciferol Take 1 tablet by mouth daily.   carvedilol 3.125 MG tablet Commonly known as:  COREG Take 1 tablet (3.125 mg total) by mouth 2 (two) times daily with a meal.   cholecalciferol 1000 units tablet Commonly known as:  VITAMIN D Take 2,000 Units by mouth daily.   furosemide 80 MG tablet Commonly known as:  LASIX Take 80 mg by mouth as directed. 80 mg in the morning   levothyroxine 25 MCG tablet Commonly known as:  SYNTHROID, LEVOTHROID Take 1 tablet by mouth daily.   losartan 25 MG tablet Commonly known as:  COZAAR Take 25 mg by mouth daily.   Melatonin 10 MG Tabs Take 10 mg by mouth daily. One at bedtime   potassium chloride SA 20 MEQ tablet Commonly known as:  K-DUR,KLOR-CON Take 40 mEq by mouth daily.   sertraline 50 MG tablet Commonly known as:  ZOLOFT Take 50 mg by mouth daily.   TESSALON PERLES 100 MG capsule Generic drug:  benzonatate Take by mouth. One every 8 hours as needed for cough. One at bedtime       Review of Systems:  Review of Systems  Constitutional: Negative for chills and fever.  HENT: Positive for hearing loss.   Eyes: Negative for blurred vision.  Respiratory: Negative for cough and shortness of breath.   Cardiovascular: Negative for chest pain, palpitations and leg swelling.  Gastrointestinal: Negative for abdominal pain.  Genitourinary: Negative for dysuria.  Musculoskeletal:  Negative for falls and joint pain.  Neurological: Negative for dizziness and loss of consciousness.  Psychiatric/Behavioral: Positive for memory loss.    Health Maintenance  Topic Date Due  . TETANUS/TDAP  08/[birth year]1943  . PNA vac Low Risk Adult (1 of 2 - PCV13) 08/24/1989  . INFLUENZA VACCINE  01/08/2017  . DEXA SCAN  Completed    Physical Exam: Vitals:   09/25/16 1327  BP: 110/60  Pulse: 76  Temp: 97.9 F (36.6 C)  TempSrc: Oral  SpO2: 93%  Weight: 103 lb (46.7 kg)   Body mass index is 22.29 kg/m. Physical Exam  Constitutional: She appears well-developed and well-nourished. No distress.  Cardiovascular: Normal rate, regular rhythm, normal heart sounds and intact distal pulses.   Pulmonary/Chest: Effort normal and breath sounds normal. No respiratory distress.  Abdominal: Bowel sounds are normal.  Musculoskeletal:  Walks with rollator walker  Neurological: She is alert.  Oriented to person and place, loss of time perception  Skin: Skin is warm and dry.  Psychiatric: She has a normal mood and affect.    Labs reviewed: Basic Metabolic Panel:  Recent Labs  09/28/15 0300 08/01/16 0944  NA 139 136*  K 4.1 4.3  BUN 27* 19  CREATININE 0.9 0.8  TSH 2.71 3.01   Liver Function Tests:  Recent Labs  09/28/15 0300  AST 14  ALT 5*  ALKPHOS 58   No results for input(s): LIPASE, AMYLASE in the last 8760 hours. No results for input(s): AMMONIA in the last 8760 hours. CBC:  Recent Labs  09/28/15 0300 08/01/16 0944  WBC 6.8 8.0  HGB 10.3* 11.6*  HCT 29* 36  PLT 191 266   Lipid Panel: No results for input(s): CHOL, HDL, LDLCALC, TRIG, CHOLHDL, LDLDIRECT in the last 8760 hours. Lab Results  Component Value Date   HGBA1C 5.1 01/27/2014   CXR reviewed from 08/01/16  Assessment/Plan 1. Vascular dementia with behavioral disturbance -pt poor historian, but managing well in AL with medications given to her and some cuing for adls  2. Hypothyroidism  (acquired) -cont current levothyroxine, 2/22 tsh normal  3. Mass of lung -has grown in right lung since last imaging in 2016, but patient doing well -seems if she was indeed ill in feb, it resolved spontaneously, no longer coughing -weight did decrease, but stabilized again now  4. Chronic systolic congestive heart failure (HCC) -stable with current regimen, no changes, no signs of acute exacerbation  Labs/tests ordered:  No new Next appt:  6 mos med mgt  Graviela Nodal L. Warden Buffa, D.O. [facility] [address], [place] [address] Cell Phone (Mon-Fri 8am-5pm):  [phone] On Call:  [phone] & follow prompts after 5pm & weekends Office Phone:  [phone] Office Fax:  [phone]

## 2016-10-21 IMAGING — CT CT CHEST W/O CM
2 of 3 series · 15 of 36 positions shown, 18 images · non-contrast
Comparison: CT scan dated 11/01/2014

CLINICAL DATA: Right upper lobe pulmonary infiltrate.

EXAM:
CT CHEST WITHOUT CONTRAST
TECHNIQUE: Multidetector CT imaging of the chest was performed following the
standard protocol without IV contrast.

[Series 3: chest 5.0 i41s 1 · axial · 0.60mm/px · z∈[-368,-158]mm · 12 of 50 slices shown, 15 images]
[im 4/50  mediastinal]
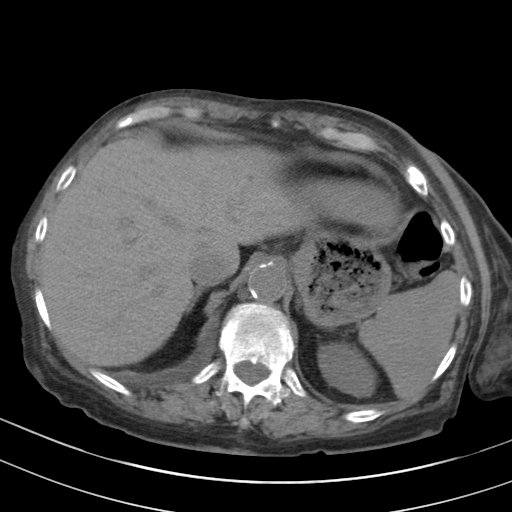
[im 4/50  lung]
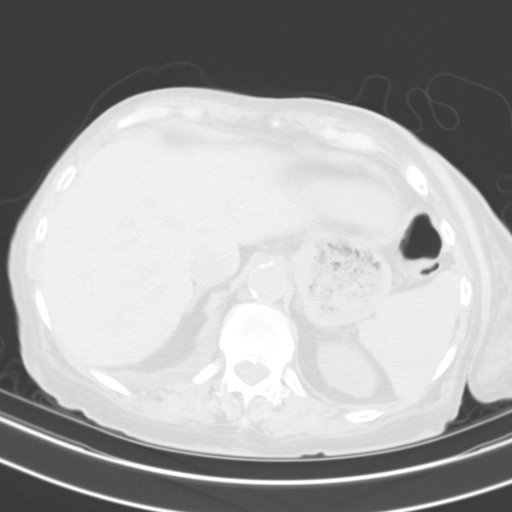
[im 8/50  lung]
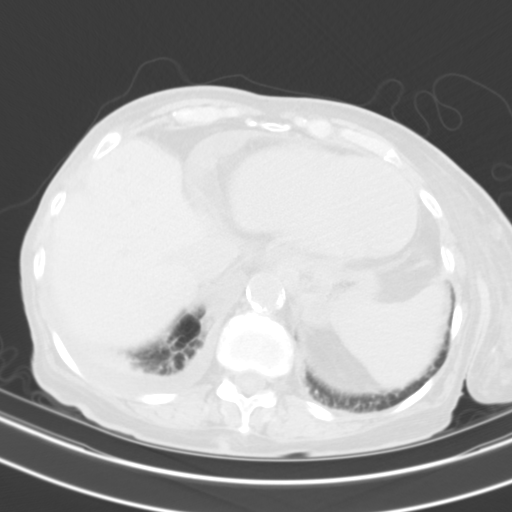
[im 11/50  lung]
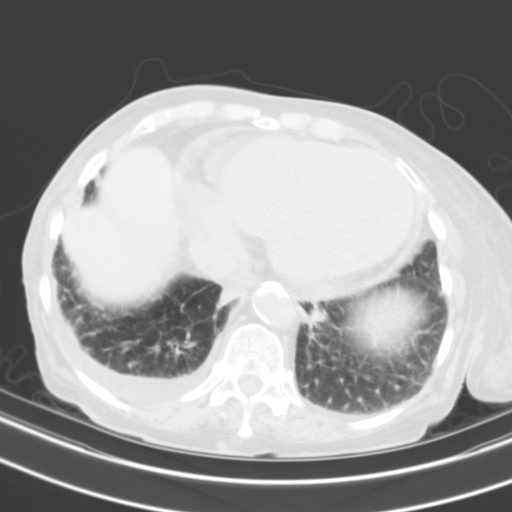
[im 15/50  lung]
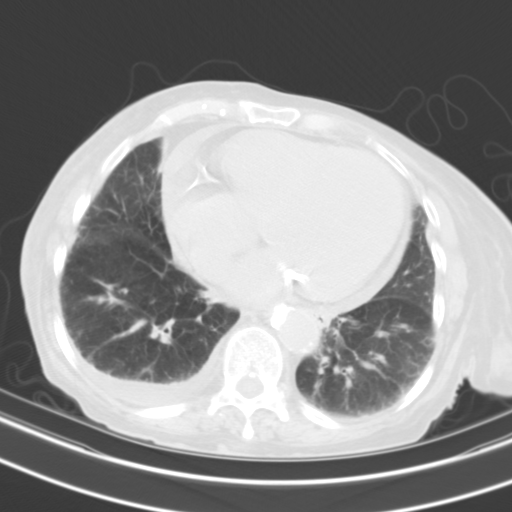
[im 19/50  mediastinal]
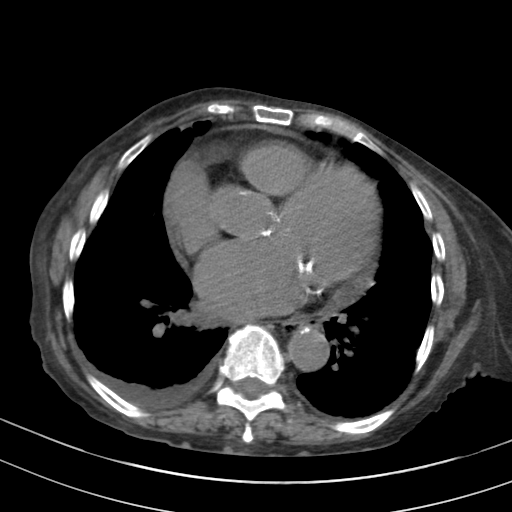
[im 19/50  lung]
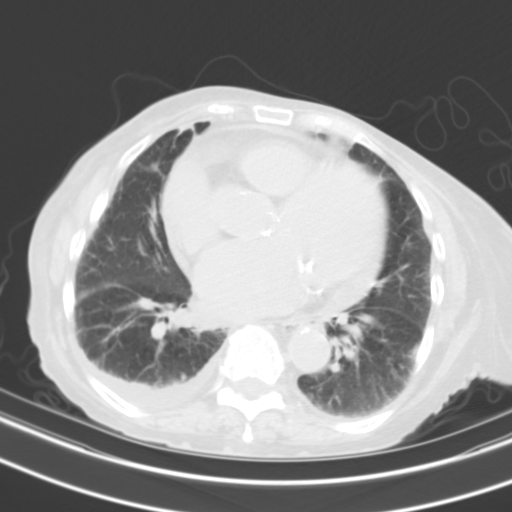
[im 22/50  lung]
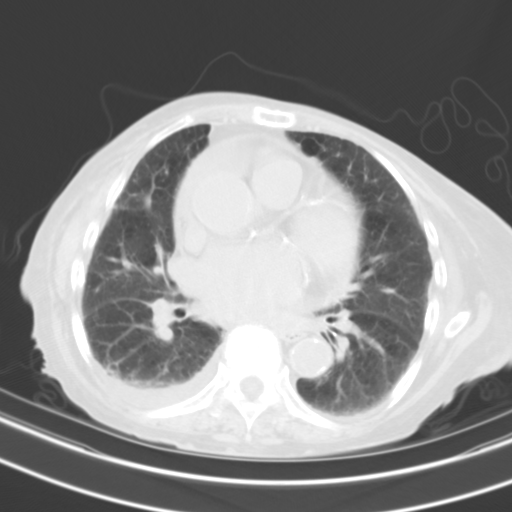
[im 28/50  lung]
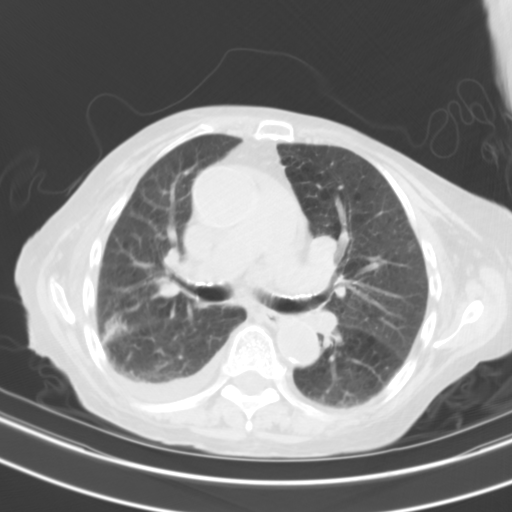
[im 31/50  lung]
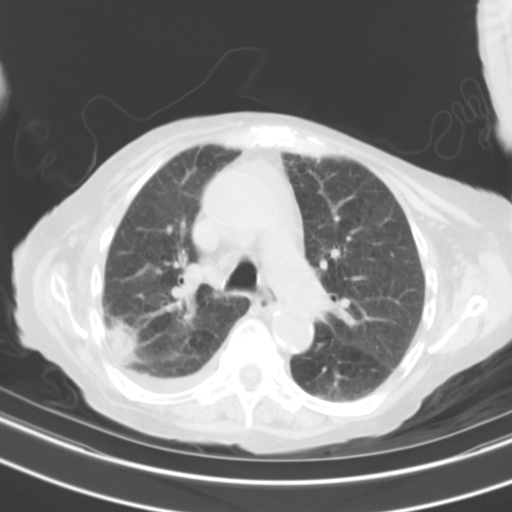
[im 35/50  mediastinal]
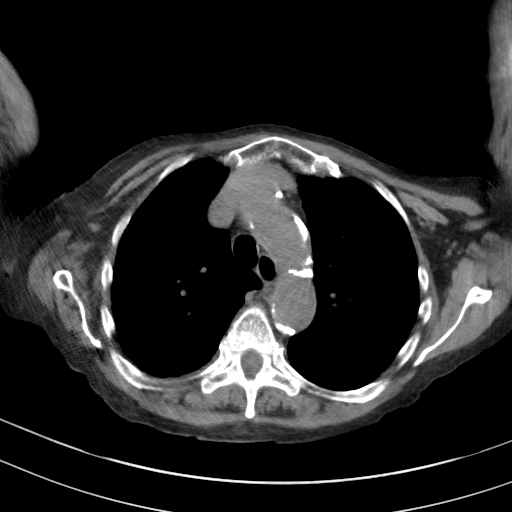
[im 35/50  lung]
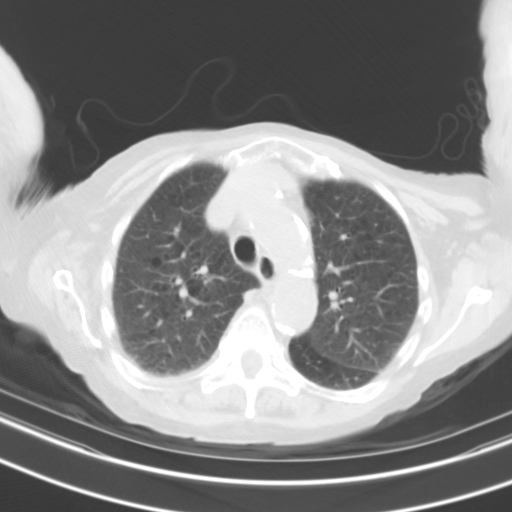
[im 39/50  lung]
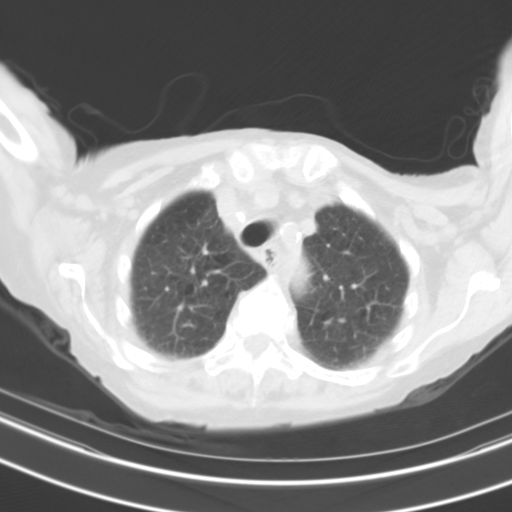
[im 42/50  lung]
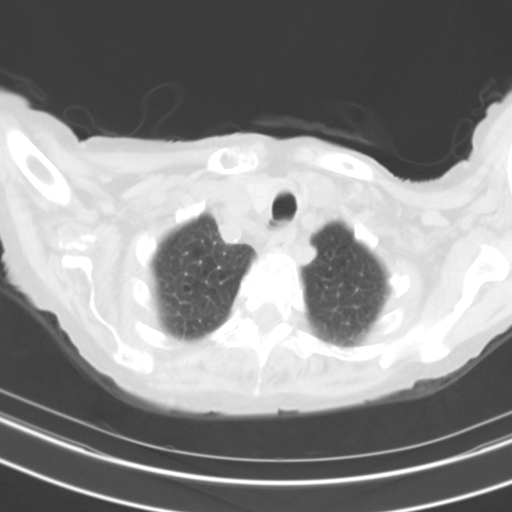
[im 46/50  lung]
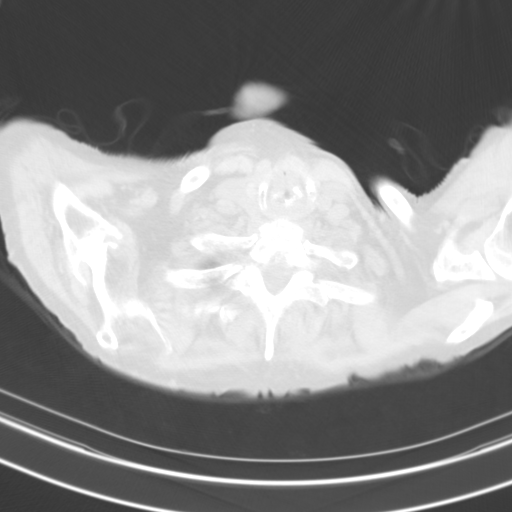

[Series 4: cor 3.0 · coronal · 0.50mm/px · 3 of 70 slices shown]
[im 14/70  lung]
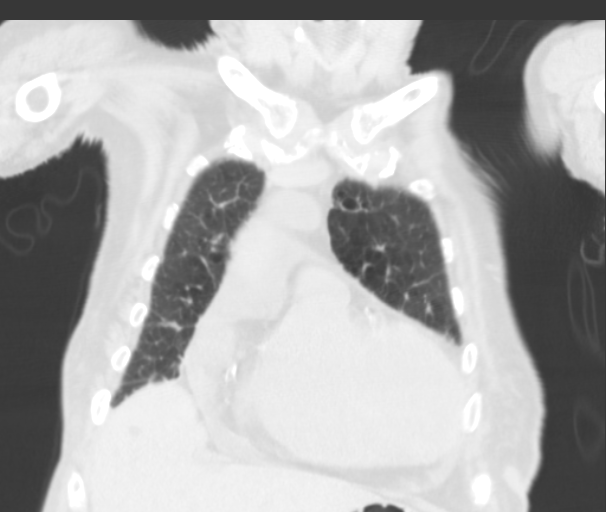
[im 28/70  lung]
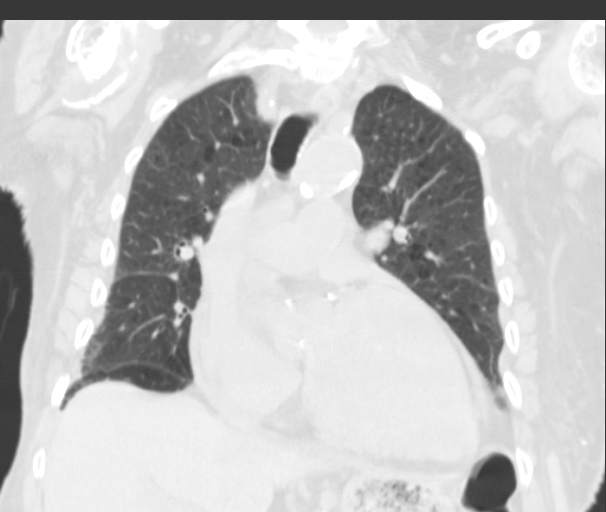
[im 42/70  lung]
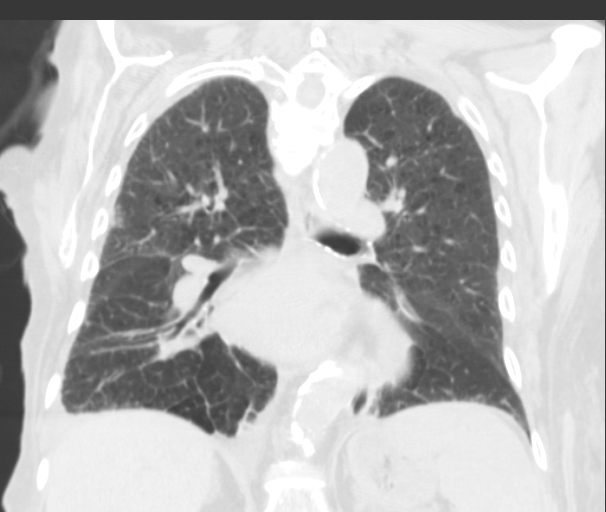

[15 of 36 positions shown; findings below may reference images not displayed]

FINDINGS: There is chronic cardiomegaly with extensive coronary artery
calcification. Small pericardial effusion, slightly increased.
Stable small right pleural effusion.

There is new slight interstitial edema at both lung bases. Increased
peribronchial thickening in both lower lobes. Small patchy area of
peripheral density in the posterior aspect of the right upper lobe
has slightly progressed. I suspect this represents pneumonia but
there is more nodularity to the abnormality than on the prior study.
Follow-up is recommended to ensure clearing and to exclude an
underlying mass.

No hilar or mediastinal adenopathy.

The visualized portion of the upper abdomen demonstrates no
significant abnormality.
IMPRESSION: 1. Persistent ill-defined density in the right upper lobe, probably
infectious in etiology. This appears slightly more prominent than on
the prior study. Follow-up is recommended to exclude an underlying
mass.
2. New slight interstitial edema at the lung bases.
3. Slight increased small pericardial effusion.
4. Persistent cardiomegaly and small right pleural effusion.

## 2017-04-02 ENCOUNTER — Encounter: Payer: Self-pay | Admitting: Internal Medicine

## 2017-04-02 ENCOUNTER — Non-Acute Institutional Stay: Payer: Medicare Other | Admitting: Internal Medicine

## 2017-04-02 VITALS — BP 118/60 | HR 74 | Temp 97.9°F | Wt 98.0 lb

## 2017-04-02 DIAGNOSIS — F01518 Vascular dementia, unspecified severity, with other behavioral disturbance: Secondary | ICD-10-CM

## 2017-04-02 DIAGNOSIS — F3341 Major depressive disorder, recurrent, in partial remission: Secondary | ICD-10-CM

## 2017-04-02 DIAGNOSIS — F0151 Vascular dementia with behavioral disturbance: Secondary | ICD-10-CM | POA: Diagnosis not present

## 2017-04-02 DIAGNOSIS — E039 Hypothyroidism, unspecified: Secondary | ICD-10-CM

## 2017-04-02 DIAGNOSIS — R918 Other nonspecific abnormal finding of lung field: Secondary | ICD-10-CM | POA: Diagnosis not present

## 2017-04-02 DIAGNOSIS — D6489 Other specified anemias: Secondary | ICD-10-CM

## 2017-04-02 DIAGNOSIS — R634 Abnormal weight loss: Secondary | ICD-10-CM | POA: Diagnosis not present

## 2017-04-02 DIAGNOSIS — I5042 Chronic combined systolic (congestive) and diastolic (congestive) heart failure: Secondary | ICD-10-CM

## 2017-04-02 DIAGNOSIS — R4689 Other symptoms and signs involving appearance and behavior: Secondary | ICD-10-CM | POA: Diagnosis not present

## 2017-04-02 DIAGNOSIS — Z853 Personal history of malignant neoplasm of breast: Secondary | ICD-10-CM | POA: Diagnosis not present

## 2017-04-02 LAB — BASIC METABOLIC PANEL
BUN: 25 — AB (ref 4–21)
Creatinine: 0.8 (ref 0.5–1.1)
Glucose: 82
Potassium: 4 (ref 3.4–5.3)
Sodium: 142 (ref 137–147)

## 2017-04-02 LAB — HEPATIC FUNCTION PANEL
ALT: 5 — AB (ref 7–35)
AST: 13 (ref 13–35)
Alkaline Phosphatase: 57 (ref 25–125)
Bilirubin, Total: 0.4

## 2017-04-02 LAB — CBC AND DIFFERENTIAL
HCT: 30 — AB (ref 36–46)
Hemoglobin: 10 — AB (ref 12.0–16.0)
Platelets: 182 (ref 150–399)
WBC: 6.1

## 2017-04-02 MED ORDER — SERTRALINE HCL 100 MG PO TABS
100.0000 mg | ORAL_TABLET | Freq: Every day | ORAL | 3 refills | Status: AC
Start: 2017-04-02 — End: ?
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
## 2017-05-28 ENCOUNTER — Non-Acute Institutional Stay: Payer: Medicare Other | Admitting: Internal Medicine

## 2017-05-28 ENCOUNTER — Encounter: Payer: Self-pay | Admitting: Internal Medicine

## 2017-05-28 ENCOUNTER — Ambulatory Visit: Payer: Medicare Other | Admitting: Internal Medicine

## 2017-05-28 VITALS — BP 130/70 | HR 84 | Temp 97.8°F | Wt 97.0 lb

## 2017-05-28 DIAGNOSIS — F01518 Vascular dementia, unspecified severity, with other behavioral disturbance: Secondary | ICD-10-CM

## 2017-05-28 DIAGNOSIS — Z853 Personal history of malignant neoplasm of breast: Secondary | ICD-10-CM | POA: Diagnosis not present

## 2017-05-28 DIAGNOSIS — F0151 Vascular dementia with behavioral disturbance: Secondary | ICD-10-CM

## 2017-05-28 DIAGNOSIS — Z0000 Encounter for general adult medical examination without abnormal findings: Secondary | ICD-10-CM

## 2017-05-28 DIAGNOSIS — R918 Other nonspecific abnormal finding of lung field: Secondary | ICD-10-CM | POA: Diagnosis not present
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
## 2017-06-30 ENCOUNTER — Emergency Department (HOSPITAL_COMMUNITY): Payer: Medicare Other

## 2017-06-30 ENCOUNTER — Encounter (HOSPITAL_COMMUNITY): Payer: Self-pay

## 2017-06-30 ENCOUNTER — Inpatient Hospital Stay (HOSPITAL_COMMUNITY)
Admission: EM | Admit: 2017-06-30 | Discharge: 2017-07-04 | DRG: 481 | Disposition: A | Discharge status: Skilled Nursing Facility | Payer: Medicare Other | Attending: Internal Medicine | Admitting: Internal Medicine

## 2017-06-30 DIAGNOSIS — S72001A Fracture of unspecified part of neck of right femur, initial encounter for closed fracture: Secondary | ICD-10-CM

## 2017-06-30 DIAGNOSIS — Z853 Personal history of malignant neoplasm of breast: Secondary | ICD-10-CM

## 2017-06-30 DIAGNOSIS — Z90710 Acquired absence of both cervix and uterus: Secondary | ICD-10-CM

## 2017-06-30 DIAGNOSIS — Z7951 Long term (current) use of inhaled steroids: Secondary | ICD-10-CM | POA: Diagnosis not present

## 2017-06-30 DIAGNOSIS — I5042 Chronic combined systolic (congestive) and diastolic (congestive) heart failure: Secondary | ICD-10-CM | POA: Diagnosis present

## 2017-06-30 DIAGNOSIS — M810 Age-related osteoporosis without current pathological fracture: Secondary | ICD-10-CM | POA: Diagnosis present

## 2017-06-30 DIAGNOSIS — I959 Hypotension, unspecified: Secondary | ICD-10-CM | POA: Diagnosis not present

## 2017-06-30 DIAGNOSIS — D62 Acute posthemorrhagic anemia: Secondary | ICD-10-CM | POA: Diagnosis not present

## 2017-06-30 DIAGNOSIS — F0391 Unspecified dementia with behavioral disturbance: Secondary | ICD-10-CM | POA: Diagnosis present

## 2017-06-30 DIAGNOSIS — R0600 Dyspnea, unspecified: Secondary | ICD-10-CM | POA: Diagnosis not present

## 2017-06-30 DIAGNOSIS — F319 Bipolar disorder, unspecified: Secondary | ICD-10-CM | POA: Diagnosis present

## 2017-06-30 DIAGNOSIS — I10 Essential (primary) hypertension: Secondary | ICD-10-CM | POA: Diagnosis present

## 2017-06-30 DIAGNOSIS — R079 Chest pain, unspecified: Secondary | ICD-10-CM | POA: Diagnosis not present

## 2017-06-30 DIAGNOSIS — E039 Hypothyroidism, unspecified: Secondary | ICD-10-CM | POA: Diagnosis present

## 2017-06-30 DIAGNOSIS — Z419 Encounter for procedure for purposes other than remedying health state, unspecified: Secondary | ICD-10-CM

## 2017-06-30 DIAGNOSIS — C50919 Malignant neoplasm of unspecified site of unspecified female breast: Secondary | ICD-10-CM | POA: Diagnosis present

## 2017-06-30 DIAGNOSIS — S72009A Fracture of unspecified part of neck of unspecified femur, initial encounter for closed fracture: Secondary | ICD-10-CM | POA: Diagnosis present

## 2017-06-30 DIAGNOSIS — Z9011 Acquired absence of right breast and nipple: Secondary | ICD-10-CM

## 2017-06-30 DIAGNOSIS — Z79890 Hormone replacement therapy (postmenopausal): Secondary | ICD-10-CM | POA: Diagnosis not present

## 2017-06-30 DIAGNOSIS — W010XXA Fall on same level from slipping, tripping and stumbling without subsequent striking against object, initial encounter: Secondary | ICD-10-CM | POA: Diagnosis present

## 2017-06-30 DIAGNOSIS — H3530 Unspecified macular degeneration: Secondary | ICD-10-CM | POA: Diagnosis present

## 2017-06-30 DIAGNOSIS — R413 Other amnesia: Secondary | ICD-10-CM | POA: Diagnosis not present

## 2017-06-30 DIAGNOSIS — S72141A Displaced intertrochanteric fracture of right femur, initial encounter for closed fracture: Secondary | ICD-10-CM | POA: Diagnosis present

## 2017-06-30 DIAGNOSIS — Z66 Do not resuscitate: Secondary | ICD-10-CM | POA: Diagnosis present

## 2017-06-30 DIAGNOSIS — Y92098 Other place in other non-institutional residence as the place of occurrence of the external cause: Secondary | ICD-10-CM

## 2017-06-30 DIAGNOSIS — Z87891 Personal history of nicotine dependence: Secondary | ICD-10-CM

## 2017-06-30 DIAGNOSIS — E785 Hyperlipidemia, unspecified: Secondary | ICD-10-CM | POA: Diagnosis present

## 2017-06-30 DIAGNOSIS — I110 Hypertensive heart disease with heart failure: Secondary | ICD-10-CM | POA: Diagnosis present

## 2017-06-30 DIAGNOSIS — R918 Other nonspecific abnormal finding of lung field: Secondary | ICD-10-CM | POA: Diagnosis present

## 2017-06-30 LAB — BASIC METABOLIC PANEL
ANION GAP: 11 (ref 5–15)
BUN: 34 mg/dL — ABNORMAL HIGH (ref 6–20)
CALCIUM: 9.2 mg/dL (ref 8.9–10.3)
CO2: 26 mmol/L (ref 22–32)
CREATININE: 1.06 mg/dL — AB (ref 0.44–1.00)
Chloride: 102 mmol/L (ref 101–111)
GFR calc Af Amer: 51 mL/min — ABNORMAL LOW (ref >60)
GFR, EST NON AFRICAN AMERICAN: 44 mL/min — AB (ref >60)
GLUCOSE: 132 mg/dL — AB (ref 65–99)
Potassium: 4.6 mmol/L (ref 3.5–5.1)
Sodium: 139 mmol/L (ref 135–145)

## 2017-06-30 LAB — TYPE AND SCREEN
ABO/RH(D): A POS
Antibody Screen: NEGATIVE

## 2017-06-30 LAB — PROTIME-INR
INR: 1.23
PROTHROMBIN TIME: 15.4 s — AB (ref 11.4–15.2)

## 2017-06-30 LAB — CBC WITH DIFFERENTIAL/PLATELET
BASOS PCT: 0 %
Basophils Absolute: 0 10*3/uL (ref 0.0–0.1)
EOS ABS: 0.1 10*3/uL (ref 0.0–0.7)
EOS PCT: 1 %
HCT: 34.4 % — ABNORMAL LOW (ref 36.0–46.0)
Hemoglobin: 11.4 g/dL — ABNORMAL LOW (ref 12.0–15.0)
Lymphocytes Relative: 6 %
Lymphs Abs: 0.6 10*3/uL — ABNORMAL LOW (ref 0.7–4.0)
MCH: 29.3 pg (ref 26.0–34.0)
MCHC: 33.1 g/dL (ref 30.0–36.0)
MCV: 88.4 fL (ref 78.0–100.0)
MONO ABS: 0.6 10*3/uL (ref 0.1–1.0)
Monocytes Relative: 7 %
NEUTROS ABS: 8.3 10*3/uL — AB (ref 1.7–7.7)
Neutrophils Relative %: 86 %
PLATELETS: 154 10*3/uL (ref 150–400)
RBC: 3.89 MIL/uL (ref 3.87–5.11)
RDW: 15.2 % (ref 11.5–15.5)
WBC: 9.7 10*3/uL (ref 4.0–10.5)

## 2017-06-30 MED ORDER — SODIUM CHLORIDE 0.9 % IV SOLN
INTRAVENOUS | Status: DC
  Administered 2017-06-30: 21:00:00 via INTRAVENOUS

## 2017-06-30 MED ORDER — FENTANYL CITRATE (PF) 100 MCG/2ML IJ SOLN
50.0000 ug | INTRAMUSCULAR | Status: AC | PRN
  Administered 2017-06-30 (×2): 50 ug via INTRAVENOUS
  Filled 2017-06-30 (×2): qty 2

## 2017-06-30 NOTE — ED Provider Notes (Addendum)
[facility] DEPT Provider Note   CSN: [id number] Arrival date & time: 06/30/17  [facility]     History   Chief Complaint Chief Complaint  Patient presents with  . Fall    HPI Tara Garrett is a 84 y.o. female.  HPI Patient presents to the emergency room for evaluation of a fall.  Patient is a resident of [facility].  She was walking down the hall when she tripped on some uneven ground.  Patient denies hitting her head or losing consciousness however staff at the facility noted that she had some bruising around her eyes and this was new.  Patient denies any trouble with headache or neck pain.  She is complaining of pain in her right leg.  No nausea vomiting.  No numbness or weakness. Past Medical History:  Diagnosis Date  . Closed fracture of two ribs 10/21/2011, 08/24/2013  . Closed fracture of unspecified part of upper end of humerus 01/13/2005  . Fall 10-09-2011   fell outdoors on driveway fell and hit right side of body and face  . Female stress incontinence 02/10/2004  . Gait disorder 08/25/2013   Poor balance   . Herpes zoster without mention of complication 16/38/4665  . Hyperglycemia   . Hyperlipidemia   . Hypertension   . Hypothyroidism   . Keratosis seborrheica   . Left knee pain   . Leg pain   . Macular degeneration   . Malignant neoplasm of breast (female), unspecified site   . Multiple fractures of ribs of left side 08/24/2013  . Neoplasm of breast    right breast cancer  . Open wound of knee, leg (except thigh), and ankle, without mention of complication 9/93/5701  . Osteoporosis   . Other malaise and fatigue 01/06/2012  . Other specified circulatory system disorders 10/09/2011  . Pain in joint, pelvic region and thigh 05/11/2012  . Pain in joint, site unspecified 02/03/39  . Phlebitis and thrombophlebitis of other deep vessels of lower extremities 01/06/2012  . Right shoulder injury 12/2009  . Shoulder pain, right 10/2009    X- rayed at Dr. Berenice Primas.Severe Degenerative Disease. Recommended an artificial shoulder. Saw Dr. Tamera Punt .  Marland Kitchen Varicose vein 2014  . Varicose veins   . Vitamin D deficiency     Patient Active Problem List   Diagnosis Date Noted  . History of breast cancer 05/28/2017  . Mass of lung 09/25/2016  . Opacity of lung on imaging study 08/05/2016  . Weight loss 07/14/2016  . Vascular dementia with behavioral disturbance 07/14/2016  . Cough 12/14/2015  . Dyspnea 12/14/2014  . Pulmonary infiltrates 12/14/2014  . Depression 10/31/2014  . Chronic combined systolic and diastolic CHF (congestive heart failure) ([place]) 10/31/2014  . Low back pain 07/26/2014  . Gait disorder 08/25/2013  . Closed fracture of two ribs   . Osteoarthritis, hip, bilateral 12/21/2012  . Hypothyroidism (acquired)   . Hypertension   . Varicose veins of lower extremities with other complications 77/93/9030  . Other malaise and fatigue 01/06/2012  . Varicose veins of lower extremities with inflammation 10/14/2011  . ADENOCARCINOMA, BREAST 10/31/2007  . COLONIC POLYPS 10/31/2007  . DIVERTICULOSIS, COLON 10/31/2007  . Senile osteoporosis 10/31/2007  . Memory loss 10/31/2007    Past Surgical History:  Procedure Laterality Date  . ABDOMINAL HYSTERECTOMY  1967  . CATARACT EXTRACTION  04/2004   Dr. Bing Plume  . colon polyps  1989, 1992, 1995, 1997  . ENDOVENOUS ABLATION SAPHENOUS VEIN W/ LASER  01-30-2012   left greater saphenous vein and sclerotherapy left leg  . left cataract  1999  . MOLE REMOVAL  03/04/2009   on back by Dr. Derrel Nip  . right mastectomy  1989  . VESICOVAGINAL FISTULA CLOSURE W/ TAH  1967    OB History    No data available       Home Medications    Prior to Admission medications   Medication Sig Start Date End Date Taking? Authorizing Provider  acetaminophen (TYLENOL) 325 MG tablet Take 650 mg by mouth every 6 (six) hours as needed for moderate pain.   Yes [provider]    benzonatate (TESSALON PERLES) 100 MG capsule Take 100 mg by mouth at bedtime.    Yes [provider]  carvedilol (COREG) 3.125 MG tablet Take 1 tablet (3.125 mg total) by mouth 2 (two) times daily with a meal. 08/06/14  Yes Reed, Tiffany L, DO  divalproex (DEPAKOTE SPRINKLE) 125 MG capsule Take 250 mg by mouth 2 (two) times daily.  05/09/17  Yes [provider]  furosemide (LASIX) 80 MG tablet Take 80 mg by mouth as directed. 80 mg in the morning   Yes [provider]  levothyroxine (SYNTHROID, LEVOTHROID) 25 MCG tablet Take 1 tablet by mouth daily. 10/20/14  Yes [provider]  losartan (COZAAR) 25 MG tablet Take 25 mg by mouth daily.   Yes [provider]  Melatonin 10 MG TABS Take 10 mg by mouth at bedtime.    Yes [provider]  menthol-cetylpyridinium (CEPACOL) 3 MG lozenge Take 1 lozenge by mouth as needed for sore throat.   Yes [provider]  potassium chloride SA (K-DUR,KLOR-CON) 20 MEQ tablet Take 40 mEq by mouth daily.   Yes [provider]  sertraline (ZOLOFT) 100 MG tablet Take 1 tablet (100 mg total) by mouth daily. 04/02/17  Yes Reed, Tiffany L, DO  ipratropium-albuterol (DUONEB) 0.5-2.5 (3) MG/3ML SOLN Take 3 mLs by nebulization every 6 (six) hours as needed (wheezing and sob).     [provider]  Menthol, Topical Analgesic, (BIOFREEZE) 4 % GEL Apply 1 application topically 2 (two) times daily as needed (shoulder pain).     [provider]  Nutritional Supplements (BOOST BREEZE PO) Take by mouth. One can twice daily    [provider]    Family History Family History  Problem Relation Age of Onset  . Cancer Mother        ? type    Social History Social History   Tobacco Use  . Smoking status: Former Smoker    Packs/day: 0.50    Years: 20.00    Pack years: 10.00    Types: Cigarettes    Last attempt to quit: 06/10/1978    Years since quitting: 39.0  . Smokeless tobacco:  Never Used  Substance Use Topics  . Alcohol use: Yes    Alcohol/week: 0.0 - 0.6 oz    Comment: 8 oz of wine per day  . Drug use: No     Allergies   Levaquin [levofloxacin in d5w]; Mobic [meloxicam]; and Tramadol   Review of Systems Review of Systems  All other systems reviewed and are negative.    Physical Exam Updated Vital Signs BP (!) 150/63   Pulse 63   Temp (!) 97.5 F (36.4 C) (Oral)   Resp 15   SpO2 92%   Physical Exam  Constitutional: She appears well-developed and well-nourished. No distress.  HENT:  Head: Normocephalic and  atraumatic.  Right Ear: External ear normal.  Left Ear: External ear normal.  Eyes: Conjunctivae are normal. Right eye exhibits no discharge. Left eye exhibits no discharge. No scleral icterus.  Small amount of bruising in the periorbital region, no bony tenderness to palpation of the face  Neck: Neck supple. No tracheal deviation present.  Cardiovascular: Normal rate, regular rhythm and intact distal pulses.  Pulmonary/Chest: Effort normal and breath sounds normal. No stridor. No respiratory distress. She has no wheezes. She has no rales.  Abdominal: Soft. Bowel sounds are normal. She exhibits no distension. There is no tenderness. There is no rebound and no guarding.  Musculoskeletal: She exhibits tenderness. She exhibits no edema.       Right hip: She exhibits decreased range of motion, tenderness and bony tenderness.       Left hip: Normal.       Cervical back: Normal.       Thoracic back: Normal.       Lumbar back: Normal.       Right upper leg: She exhibits no swelling.       Right lower leg: Normal.  Neurological: She is alert. She has normal strength. No cranial nerve deficit (no facial droop, extraocular movements intact, no slurred speech) or sensory deficit. She exhibits normal muscle tone. She displays no seizure activity. Coordination normal.  Skin: Skin is warm and dry. No rash noted.  Psychiatric: She has a normal mood and  affect.  Nursing note and vitals reviewed.    ED Treatments / Results  Labs (all labs ordered are listed, but only abnormal results are displayed) Labs Reviewed  CBC WITH DIFFERENTIAL/PLATELET - Abnormal; Notable for the following components:      Result Value   Hemoglobin 11.4 (*)    HCT 34.4 (*)    Neutro Abs 8.3 (*)    Lymphs Abs 0.6 (*)    All other components within normal limits  PROTIME-INR - Abnormal; Notable for the following components:   Prothrombin Time 15.4 (*)    All other components within normal limits  BASIC METABOLIC PANEL  TYPE AND SCREEN    EKG  EKG Interpretation  Date/Time:  Monday June 30 2017 20:08:14 EST Ventricular Rate:  70 PR Interval:  182 QRS Duration: 90 QT Interval:  456 QTC Calculation: 492 R Axis:   -44 Text Interpretation:  Normal sinus rhythm Left axis deviation Left ventricular hypertrophy with repolarization abnormality Cannot rule out Septal infarct , age undetermined Abnormal ECG No old tracing to compare Confirmed by Dorie Rank [phone]) on 06/30/2017 8:17:24 PM       Radiology Dg Chest 2 View  Result Date: 06/30/2017 CLINICAL DATA:  Fall with hip pain, history of breast cancer EXAM: CHEST  2 VIEW COMPARISON:  12/14/2014, 08/24/2013 FINDINGS: Ground-glass opacity and consolidation in the right upper lobe. No pleural effusion. Cardiomegaly with aortic atherosclerosis. No pneumothorax. Severe chronic deformity of the right shoulder. Multiple old right-sided rib fractures IMPRESSION: 1. Ground-glass opacity and consolidation in the right upper lobe, possibly a pneumonia however imaging follow-up to resolution recommended to exclude underlying mass 2. Moderate severe cardiomegaly without edema Electronically Signed   By: Donavan Foil M.D.   On: 06/30/2017 20:43   Ct Head Wo Contrast  Result Date: 06/30/2017 CLINICAL DATA:  Head trauma unwitnessed fall EXAM: CT HEAD WITHOUT CONTRAST CT CERVICAL SPINE WITHOUT CONTRAST TECHNIQUE:  Multidetector CT imaging of the head and cervical spine was performed following the standard protocol without intravenous contrast. Multiplanar  CT image reconstructions of the cervical spine were also generated. COMPARISON:  None. FINDINGS: CT HEAD FINDINGS Brain: No acute territorial infarction, hemorrhage, or intracranial mass is visualized. Moderate-to-marked atrophy. Mild to moderate subcortical and periventricular white matter hypodensity consistent with small vessel ischemic changes. Prominent ventricles, felt related to atrophy Vascular: No hyperdense vessels.  Carotid artery calcifications. Skull: No fracture or suspicious lesion Sinuses/Orbits: Mild mucosal thickening in the ethmoid and maxillary sinuses. No acute orbital abnormality. Other: None CT CERVICAL SPINE FINDINGS Alignment: No subluxation.  Facet alignment is within normal limits. Skull base and vertebrae: No acute fracture. No primary bone lesion or focal pathologic process. Soft tissues and spinal canal: No prevertebral fluid or swelling. No visible canal hematoma. Disc levels: Moderate severe diffuse degenerative changes C3 through C7. Multiple level bilateral facet arthropathy with multilevel bilateral foraminal stenosis. Upper chest: Partially visualized ground-glass density in the right upper lobe. Mild apical emphysema. Aortic atherosclerosis. No thyroid mass Other: None IMPRESSION: 1. No CT evidence for acute intracranial abnormality. Atrophy and small vessel ischemic changes of the white matter 2. Degenerative changes of the cervical spine. No acute osseous abnormality 3. Mild apical emphysema. Partially visualized ground-glass density in the right upper lobe suspicious for pneumonia Electronically Signed   By: Donavan Foil M.D.   On: 06/30/2017 20:54   Ct Cervical Spine Wo Contrast  Result Date: 06/30/2017 CLINICAL DATA:  Head trauma unwitnessed fall EXAM: CT HEAD WITHOUT CONTRAST CT CERVICAL SPINE WITHOUT CONTRAST TECHNIQUE:  Multidetector CT imaging of the head and cervical spine was performed following the standard protocol without intravenous contrast. Multiplanar CT image reconstructions of the cervical spine were also generated. COMPARISON:  None. FINDINGS: CT HEAD FINDINGS Brain: No acute territorial infarction, hemorrhage, or intracranial mass is visualized. Moderate-to-marked atrophy. Mild to moderate subcortical and periventricular white matter hypodensity consistent with small vessel ischemic changes. Prominent ventricles, felt related to atrophy Vascular: No hyperdense vessels.  Carotid artery calcifications. Skull: No fracture or suspicious lesion Sinuses/Orbits: Mild mucosal thickening in the ethmoid and maxillary sinuses. No acute orbital abnormality. Other: None CT CERVICAL SPINE FINDINGS Alignment: No subluxation.  Facet alignment is within normal limits. Skull base and vertebrae: No acute fracture. No primary bone lesion or focal pathologic process. Soft tissues and spinal canal: No prevertebral fluid or swelling. No visible canal hematoma. Disc levels: Moderate severe diffuse degenerative changes C3 through C7. Multiple level bilateral facet arthropathy with multilevel bilateral foraminal stenosis. Upper chest: Partially visualized ground-glass density in the right upper lobe. Mild apical emphysema. Aortic atherosclerosis. No thyroid mass Other: None IMPRESSION: 1. No CT evidence for acute intracranial abnormality. Atrophy and small vessel ischemic changes of the white matter 2. Degenerative changes of the cervical spine. No acute osseous abnormality 3. Mild apical emphysema. Partially visualized ground-glass density in the right upper lobe suspicious for pneumonia Electronically Signed   By: Donavan Foil M.D.   On: 06/30/2017 20:54   Dg Hip Unilat With Pelvis 2-3 Views Right  Result Date: 06/30/2017 CLINICAL DATA:  Right hip pain due to a fall today. Initial encounter. EXAM: DG HIP (WITH OR WITHOUT PELVIS) 2-3V  RIGHT COMPARISON:  None. FINDINGS: The patient has an acute right intertrochanteric fracture. No other acute bony or joint abnormality is seen. IMPRESSION: Acute right intertrochanteric fracture. Electronically Signed   By: Inge Rise M.D.   On: 06/30/2017 20:41    Procedures Procedures (including critical care time)  Medications Ordered in ED Medications  0.9 %  sodium chloride infusion ( Intravenous  New Bag/Given 06/30/17 2106)  fentaNYL (SUBLIMAZE) injection 50 mcg (50 mcg Intravenous Given 06/30/17 2106)     Initial Impression / Assessment and Plan / ED Course  I have reviewed the triage vital signs and the nursing notes.  Pertinent labs & imaging results that were available during my care of the patient were reviewed by me and considered in my medical decision making (see chart for details).   Patient presented to the emergency room for evaluation after a fall.  X-rays unfortunately show a right intertrochanteric hip fracture.  No other serious injuries noted.  Labs otherwise unremarkable.  Endings discussed with the patient and her friends.  I will consult with the orthopedic doctor and consult the medical service for admission.  Final Clinical Impressions(s) / ED Diagnoses   Final diagnoses:  Closed fracture of femur, intertrochanteric, right, initial encounter [facility])      Dorie Rank, MD 06/30/17 2147   Discussed case with Dr Synthia Innocent.  Plan on surgery on Wednesday.  I will consult with medical service   Dorie Rank, MD 06/30/17 2232

## 2017-06-30 NOTE — ED Notes (Signed)
Bed: [facility] Expected date:  Expected time:  Means of arrival:  Comments: 84 yo fall, hip/knee pain

## 2017-06-30 NOTE — ED Notes (Signed)
Lab draw delayed due to patient being gone to CT and X-ray.

## 2017-06-30 NOTE — ED Notes (Signed)
ED TO INPATIENT HANDOFF REPORT  Name/Age/Gender Tara Garrett 84 y.o. female  Code Status Code Status History    Date Active Date Inactive Code Status Order ID Comments User Context   07/20/2014 15:07 06/30/2017 18:48 DNR [id number]  Lauree Chandler, NP Outpatient    Questions for Most Recent Historical Code Status (Order [id number])    Question Answer Comment   In the event of cardiac or respiratory ARREST Do not call a "code blue"    In the event of cardiac or respiratory ARREST Do not perform Intubation, CPR, defibrillation or ACLS    In the event of cardiac or respiratory ARREST Use medication by any route, position, wound care, and other measures to relive pain and suffering. May use oxygen, suction and manual treatment of airway obstruction as needed for comfort.         Advance Directive Documentation     Most Recent Value  Type of Advance Directive  Out of facility DNR (pink MOST or yellow form)  Pre-existing out of facility DNR order (yellow form or pink MOST form)  No data  "MOST" Form in Place?  No data      Home/SNF/Other Nursing Home  Chief Complaint Fall  Level of Care/Admitting Diagnosis ED Disposition    ED Disposition Condition Comment   Admit  Hospital Area: [facility] [[id number]]  Level of Care: Med-Surg [16]  Diagnosis: Hip fracture [facility]) [[id number]]  Admitting Physician: [name] [phone]  Attending Physician: [name] [phone]  Estimated length of stay: past midnight tomorrow  Certification:: I certify this patient will need inpatient services for at least 2 midnights  PT Class (Do Not Modify): Inpatient [101]  PT Acc Code (Do Not Modify): Private [1]       Medical History Past Medical History:  Diagnosis Date  . Closed fracture of two ribs 10/21/2011, 08/24/2013  . Closed fracture of unspecified part of upper end of humerus 01/13/2005  . Fall 10-09-2011   fell outdoors on driveway fell and hit right side of  body and face  . Female stress incontinence 02/10/2004  . Gait disorder 08/25/2013   Poor balance   . Herpes zoster without mention of complication 66/29/4765  . Hyperglycemia   . Hyperlipidemia   . Hypertension   . Hypothyroidism   . Keratosis seborrheica   . Left knee pain   . Leg pain   . Macular degeneration   . Malignant neoplasm of breast (female), unspecified site   . Multiple fractures of ribs of left side 08/24/2013  . Neoplasm of breast    right breast cancer  . Open wound of knee, leg (except thigh), and ankle, without mention of complication 4/65/0354  . Osteoporosis   . Other malaise and fatigue 01/06/2012  . Other specified circulatory system disorders 10/09/2011  . Pain in joint, pelvic region and thigh 05/11/2012  . Pain in joint, site unspecified 02/03/39  . Phlebitis and thrombophlebitis of other deep vessels of lower extremities 01/06/2012  . Right shoulder injury 12/2009  . Shoulder pain, right 10/2009   X- rayed at Dr. Berenice Primas.Severe Degenerative Disease. Recommended an artificial shoulder. Saw Dr. Tamera Punt .  Marland Kitchen Varicose vein 2014  . Varicose veins   . Vitamin D deficiency     Allergies Allergies  Allergen Reactions  . Levaquin [Levofloxacin In D5w] Diarrhea  . Mobic [Meloxicam]     Blood pressure goes up  . Tramadol     Blood pressure goes up  IV Location/Drains/Wounds Patient Lines/Drains/Airways Status   Active Line/Drains/Airways    Name:   Placement date:   Placement time:   Site:   Days:   Peripheral IV 06/30/17 Right Forearm   06/30/17    2337    Forearm   less than 1          Labs/Imaging Results for orders placed or performed during the hospital encounter of 06/30/17 (from the past 48 hour(s))  Basic metabolic panel     Status: Abnormal   Collection Time: 06/30/17  8:56 PM  Result Value Ref Range   Sodium 139 135 - 145 mmol/L   Potassium 4.6 3.5 - 5.1 mmol/L   Chloride 102 101 - 111 mmol/L   CO2 26 22 - 32 mmol/L   Glucose, Bld 132 (H)  65 - 99 mg/dL   BUN 34 (H) 6 - 20 mg/dL   Creatinine, Ser 1.06 (H) 0.44 - 1.00 mg/dL   Calcium 9.2 8.9 - 10.3 mg/dL   GFR calc non Af Amer 44 (L) >60 mL/min   GFR calc Af Amer 51 (L) >60 mL/min    Comment: (NOTE) The eGFR has been calculated using the CKD EPI equation. This calculation has not been validated in all clinical situations. eGFR's persistently <60 mL/min signify possible Chronic Kidney Disease.    Anion gap 11 5 - 15  CBC WITH DIFFERENTIAL     Status: Abnormal   Collection Time: 06/30/17  8:56 PM  Result Value Ref Range   WBC 9.7 4.0 - 10.5 K/uL   RBC 3.89 3.87 - 5.11 MIL/uL   Hemoglobin 11.4 (L) 12.0 - 15.0 g/dL   HCT 34.4 (L) 36.0 - 46.0 %   MCV 88.4 78.0 - 100.0 fL   MCH 29.3 26.0 - 34.0 pg   MCHC 33.1 30.0 - 36.0 g/dL   RDW 15.2 11.5 - 15.5 %   Platelets 154 150 - 400 K/uL   Neutrophils Relative % 86 %   Neutro Abs 8.3 (H) 1.7 - 7.7 K/uL   Lymphocytes Relative 6 %   Lymphs Abs 0.6 (L) 0.7 - 4.0 K/uL   Monocytes Relative 7 %   Monocytes Absolute 0.6 0.1 - 1.0 K/uL   Eosinophils Relative 1 %   Eosinophils Absolute 0.1 0.0 - 0.7 K/uL   Basophils Relative 0 %   Basophils Absolute 0.0 0.0 - 0.1 K/uL  Protime-INR     Status: Abnormal   Collection Time: 06/30/17  8:56 PM  Result Value Ref Range   Prothrombin Time 15.4 (H) 11.4 - 15.2 seconds   INR 1.23   Type and screen [place]     Status: None   Collection Time: 06/30/17  8:56 PM  Result Value Ref Range   ABO/RH(D) A POS    Antibody Screen NEG    Sample Expiration 07/03/2017    Dg Chest 2 View  Result Date: 06/30/2017 CLINICAL DATA:  Fall with hip pain, history of breast cancer EXAM: CHEST  2 VIEW COMPARISON:  12/14/2014, 08/24/2013 FINDINGS: Ground-glass opacity and consolidation in the right upper lobe. No pleural effusion. Cardiomegaly with aortic atherosclerosis. No pneumothorax. Severe chronic deformity of the right shoulder. Multiple old right-sided rib fractures IMPRESSION: 1.  Ground-glass opacity and consolidation in the right upper lobe, possibly a pneumonia however imaging follow-up to resolution recommended to exclude underlying mass 2. Moderate severe cardiomegaly without edema Electronically Signed   By: Donavan Foil M.D.   On: 06/30/2017 20:43   Ct Head  Wo Contrast  Result Date: 06/30/2017 CLINICAL DATA:  Head trauma unwitnessed fall EXAM: CT HEAD WITHOUT CONTRAST CT CERVICAL SPINE WITHOUT CONTRAST TECHNIQUE: Multidetector CT imaging of the head and cervical spine was performed following the standard protocol without intravenous contrast. Multiplanar CT image reconstructions of the cervical spine were also generated. COMPARISON:  None. FINDINGS: CT HEAD FINDINGS Brain: No acute territorial infarction, hemorrhage, or intracranial mass is visualized. Moderate-to-marked atrophy. Mild to moderate subcortical and periventricular white matter hypodensity consistent with small vessel ischemic changes. Prominent ventricles, felt related to atrophy Vascular: No hyperdense vessels.  Carotid artery calcifications. Skull: No fracture or suspicious lesion Sinuses/Orbits: Mild mucosal thickening in the ethmoid and maxillary sinuses. No acute orbital abnormality. Other: None CT CERVICAL SPINE FINDINGS Alignment: No subluxation.  Facet alignment is within normal limits. Skull base and vertebrae: No acute fracture. No primary bone lesion or focal pathologic process. Soft tissues and spinal canal: No prevertebral fluid or swelling. No visible canal hematoma. Disc levels: Moderate severe diffuse degenerative changes C3 through C7. Multiple level bilateral facet arthropathy with multilevel bilateral foraminal stenosis. Upper chest: Partially visualized ground-glass density in the right upper lobe. Mild apical emphysema. Aortic atherosclerosis. No thyroid mass Other: None IMPRESSION: 1. No CT evidence for acute intracranial abnormality. Atrophy and small vessel ischemic changes of the white matter  2. Degenerative changes of the cervical spine. No acute osseous abnormality 3. Mild apical emphysema. Partially visualized ground-glass density in the right upper lobe suspicious for pneumonia Electronically Signed   By: Donavan Foil M.D.   On: 06/30/2017 20:54   Ct Cervical Spine Wo Contrast  Result Date: 06/30/2017 CLINICAL DATA:  Head trauma unwitnessed fall EXAM: CT HEAD WITHOUT CONTRAST CT CERVICAL SPINE WITHOUT CONTRAST TECHNIQUE: Multidetector CT imaging of the head and cervical spine was performed following the standard protocol without intravenous contrast. Multiplanar CT image reconstructions of the cervical spine were also generated. COMPARISON:  None. FINDINGS: CT HEAD FINDINGS Brain: No acute territorial infarction, hemorrhage, or intracranial mass is visualized. Moderate-to-marked atrophy. Mild to moderate subcortical and periventricular white matter hypodensity consistent with small vessel ischemic changes. Prominent ventricles, felt related to atrophy Vascular: No hyperdense vessels.  Carotid artery calcifications. Skull: No fracture or suspicious lesion Sinuses/Orbits: Mild mucosal thickening in the ethmoid and maxillary sinuses. No acute orbital abnormality. Other: None CT CERVICAL SPINE FINDINGS Alignment: No subluxation.  Facet alignment is within normal limits. Skull base and vertebrae: No acute fracture. No primary bone lesion or focal pathologic process. Soft tissues and spinal canal: No prevertebral fluid or swelling. No visible canal hematoma. Disc levels: Moderate severe diffuse degenerative changes C3 through C7. Multiple level bilateral facet arthropathy with multilevel bilateral foraminal stenosis. Upper chest: Partially visualized ground-glass density in the right upper lobe. Mild apical emphysema. Aortic atherosclerosis. No thyroid mass Other: None IMPRESSION: 1. No CT evidence for acute intracranial abnormality. Atrophy and small vessel ischemic changes of the white matter 2.  Degenerative changes of the cervical spine. No acute osseous abnormality 3. Mild apical emphysema. Partially visualized ground-glass density in the right upper lobe suspicious for pneumonia Electronically Signed   By: Donavan Foil M.D.   On: 06/30/2017 20:54   Dg Hip Unilat With Pelvis 2-3 Views Right  Result Date: 06/30/2017 CLINICAL DATA:  Right hip pain due to a fall today. Initial encounter. EXAM: DG HIP (WITH OR WITHOUT PELVIS) 2-3V RIGHT COMPARISON:  None. FINDINGS: The patient has an acute right intertrochanteric fracture. No other acute bony or joint abnormality is seen.  IMPRESSION: Acute right intertrochanteric fracture. Electronically Signed   By: Inge Rise M.D.   On: 06/30/2017 20:41    Pending Labs Unresulted Labs (From admission, onward)   Start     Ordered   06/30/17 2058  ABO/Rh  Once,   R     06/30/17 2058      Vitals/Pain Today's Vitals   06/30/17 1857 06/30/17 2235  BP: (!) 150/63 (!) 142/56  Pulse: 63 69  Resp: 15 20  Temp: (!) 97.5 F (36.4 C)   TempSrc: Oral   SpO2: 92% 93%    Isolation Precautions No active isolations  Medications Medications  0.9 %  sodium chloride infusion ( Intravenous New Bag/Given 06/30/17 2106)  fentaNYL (SUBLIMAZE) injection 50 mcg (50 mcg Intravenous Given 06/30/17 2348)    Mobility non-ambulatory

## 2017-06-30 NOTE — ED Triage Notes (Signed)
EMS reports from [facility], unwitnessed fall. Pt states she hit her head, denies LOC. Not on blood thinners. Denies head or neck pain, c/o right leg pain. PT DNR in packet   BP 140/90 HR 61 Resp 14 CBG 137

## 2017-07-01 ENCOUNTER — Other Ambulatory Visit: Payer: Self-pay

## 2017-07-01 DIAGNOSIS — S72009A Fracture of unspecified part of neck of unspecified femur, initial encounter for closed fracture: Secondary | ICD-10-CM | POA: Diagnosis present

## 2017-07-01 DIAGNOSIS — I10 Essential (primary) hypertension: Secondary | ICD-10-CM

## 2017-07-01 DIAGNOSIS — S72001A Fracture of unspecified part of neck of right femur, initial encounter for closed fracture: Secondary | ICD-10-CM

## 2017-07-01 DIAGNOSIS — I5042 Chronic combined systolic (congestive) and diastolic (congestive) heart failure: Secondary | ICD-10-CM

## 2017-07-01 LAB — BASIC METABOLIC PANEL
ANION GAP: 9 (ref 5–15)
BUN: 32 mg/dL — ABNORMAL HIGH (ref 6–20)
CHLORIDE: 104 mmol/L (ref 101–111)
CO2: 26 mmol/L (ref 22–32)
Calcium: 8.7 mg/dL — ABNORMAL LOW (ref 8.9–10.3)
Creatinine, Ser: 1.02 mg/dL — ABNORMAL HIGH (ref 0.44–1.00)
GFR calc Af Amer: 54 mL/min — ABNORMAL LOW (ref >60)
GFR, EST NON AFRICAN AMERICAN: 46 mL/min — AB (ref >60)
GLUCOSE: 162 mg/dL — AB (ref 65–99)
POTASSIUM: 4.2 mmol/L (ref 3.5–5.1)
SODIUM: 139 mmol/L (ref 135–145)

## 2017-07-01 LAB — CBC
HCT: 29.1 % — ABNORMAL LOW (ref 36.0–46.0)
HEMOGLOBIN: 9.7 g/dL — AB (ref 12.0–15.0)
MCH: 29.2 pg (ref 26.0–34.0)
MCHC: 33.3 g/dL (ref 30.0–36.0)
MCV: 87.7 fL (ref 78.0–100.0)
PLATELETS: 140 10*3/uL — AB (ref 150–400)
RBC: 3.32 MIL/uL — AB (ref 3.87–5.11)
RDW: 15.2 % (ref 11.5–15.5)
WBC: 10.8 10*3/uL — AB (ref 4.0–10.5)

## 2017-07-01 LAB — ABO/RH: ABO/RH(D): A POS

## 2017-07-01 LAB — SURGICAL PCR SCREEN
MRSA, PCR: NEGATIVE
Staphylococcus aureus: POSITIVE — AB

## 2017-07-01 LAB — PROCALCITONIN

## 2017-07-01 LAB — VALPROIC ACID LEVEL: VALPROIC ACID LVL: 68 ug/mL (ref 50.0–100.0)

## 2017-07-01 MED ORDER — LOSARTAN POTASSIUM 25 MG PO TABS
25.0000 mg | ORAL_TABLET | Freq: Every day | ORAL | Status: DC
  Administered 2017-07-01 – 2017-07-02 (×2): 25 mg via ORAL
  Filled 2017-07-01 (×2): qty 1

## 2017-07-01 MED ORDER — IPRATROPIUM-ALBUTEROL 0.5-2.5 (3) MG/3ML IN SOLN: 3.0000 mL | Freq: Four times a day (QID) | RESPIRATORY_TRACT | Status: DC | PRN

## 2017-07-01 MED ORDER — CEFAZOLIN SODIUM-DEXTROSE 2-4 GM/100ML-% IV SOLN
2.0000 g | Freq: Once | INTRAVENOUS | Status: AC
  Administered 2017-07-02: 2 g via INTRAVENOUS
  Filled 2017-07-01: qty 100

## 2017-07-01 MED ORDER — LEVOTHYROXINE SODIUM 25 MCG PO TABS
25.0000 ug | ORAL_TABLET | Freq: Every day | ORAL | Status: DC
  Administered 2017-07-01 – 2017-07-04 (×4): 25 ug via ORAL
  Filled 2017-07-01 (×4): qty 1

## 2017-07-01 MED ORDER — CHLORHEXIDINE GLUCONATE CLOTH 2 % EX PADS
6.0000 | MEDICATED_PAD | Freq: Every day | CUTANEOUS | Status: DC
  Administered 2017-07-01 – 2017-07-03 (×3): 6 via TOPICAL

## 2017-07-01 MED ORDER — HYDROCODONE-ACETAMINOPHEN 5-325 MG PO TABS
1.0000 | ORAL_TABLET | Freq: Four times a day (QID) | ORAL | Status: DC | PRN
  Administered 2017-07-01 (×2): 1 via ORAL
  Administered 2017-07-01 – 2017-07-02 (×2): 2 via ORAL
  Administered 2017-07-03 – 2017-07-04 (×3): 1 via ORAL
  Filled 2017-07-01 (×3): qty 1
  Filled 2017-07-01: qty 2
  Filled 2017-07-01: qty 1
  Filled 2017-07-01: qty 2
  Filled 2017-07-01: qty 1
  Filled 2017-07-01: qty 2

## 2017-07-01 MED ORDER — DIVALPROEX SODIUM 125 MG PO CSDR
250.0000 mg | DELAYED_RELEASE_CAPSULE | Freq: Two times a day (BID) | ORAL | Status: DC
  Administered 2017-07-01 – 2017-07-04 (×8): 250 mg via ORAL
  Filled 2017-07-01 (×8): qty 2

## 2017-07-01 MED ORDER — CARVEDILOL 3.125 MG PO TABS
3.1250 mg | ORAL_TABLET | Freq: Two times a day (BID) | ORAL | Status: DC
  Administered 2017-07-01 – 2017-07-02 (×3): 3.125 mg via ORAL
  Filled 2017-07-01 (×5): qty 1

## 2017-07-01 MED ORDER — MUPIROCIN 2 % EX OINT
1.0000 "application " | TOPICAL_OINTMENT | Freq: Two times a day (BID) | CUTANEOUS | Status: DC
  Administered 2017-07-01 – 2017-07-04 (×7): 1 via NASAL
  Filled 2017-07-01 (×2): qty 22

## 2017-07-01 MED ORDER — SERTRALINE HCL 100 MG PO TABS
100.0000 mg | ORAL_TABLET | Freq: Every day | ORAL | Status: DC
  Administered 2017-07-01 – 2017-07-04 (×4): 100 mg via ORAL
  Filled 2017-07-01: qty 2
  Filled 2017-07-01: qty 1
  Filled 2017-07-01: qty 2
  Filled 2017-07-01: qty 1

## 2017-07-01 MED ORDER — MORPHINE SULFATE (PF) 2 MG/ML IV SOLN
0.5000 mg | INTRAVENOUS | Status: DC | PRN
  Administered 2017-07-01: 0.5 mg via INTRAVENOUS
  Filled 2017-07-01: qty 1

## 2017-07-01 NOTE — H&P (Signed)
History and Physical    Tara Garrett HYW:[id number] DOB: 07-21-1922 DOA: 06/30/2017  PCP: [name], DO  Patient coming from: Skilled nursing facility.  Chief Complaint: Fall.  HPI: Tara Garrett is a 84 y.o. female with history of dementia, cardiomyopathy, hypertension, anemia, bipolar disorder, hypothyroidism was brought to the ER after patient had an unwitnessed fall.  Patient states he was walking and suddenly the floor height was different and she slipped and fell.  Patient denied hitting her face or head but nurses noticed ecchymotic areas periorbitally.  Patient denies any chest pain shortness of breath or palpitations.  Was complaining of right hip pain.  ED Course: In the ER x-rays revealed right hip fracture.  CT of the head and neck was unremarkable.  Dr. Elmyra Ricks orthopedic surgeon was consulted patient admitted for further management.  Review of Systems: As per HPI, rest all negative.   Past Medical History:  Diagnosis Date  . Closed fracture of two ribs 10/21/2011, 08/24/2013  . Closed fracture of unspecified part of upper end of humerus 01/13/2005  . Fall 10-09-2011   fell outdoors on driveway fell and hit right side of body and face  . Female stress incontinence 02/10/2004  . Gait disorder 08/25/2013   Poor balance   . Herpes zoster without mention of complication 48/54/6270  . Hyperglycemia   . Hyperlipidemia   . Hypertension   . Hypothyroidism   . Keratosis seborrheica   . Left knee pain   . Leg pain   . Macular degeneration   . Malignant neoplasm of breast (female), unspecified site   . Multiple fractures of ribs of left side 08/24/2013  . Neoplasm of breast    right breast cancer  . Open wound of knee, leg (except thigh), and ankle, without mention of complication 3/50/0938  . Osteoporosis   . Other malaise and fatigue 01/06/2012  . Other specified circulatory system disorders 10/09/2011  . Pain in joint, pelvic region and thigh 05/11/2012  . Pain in  joint, site unspecified 02/03/39  . Phlebitis and thrombophlebitis of other deep vessels of lower extremities 01/06/2012  . Right shoulder injury 12/2009  . Shoulder pain, right 10/2009   X- rayed at Dr. Berenice Primas.Severe Degenerative Disease. Recommended an artificial shoulder. Saw Dr. Tamera Punt .  Marland Kitchen Varicose vein 2014  . Varicose veins   . Vitamin D deficiency     Past Surgical History:  Procedure Laterality Date  . ABDOMINAL HYSTERECTOMY  1967  . CATARACT EXTRACTION  04/2004   Dr. Bing Plume  . colon polyps  1989, 1992, 1995, 1997  . ENDOVENOUS ABLATION SAPHENOUS VEIN W/ LASER  01-30-2012   left greater saphenous vein and sclerotherapy left leg  . left cataract  1999  . MOLE REMOVAL  03/04/2009   on back by Dr. Derrel Nip  . right mastectomy  1989  . VESICOVAGINAL FISTULA CLOSURE W/ TAH  1967     reports that she quit smoking about 39 years ago. Her smoking use included cigarettes. She has a 10.00 pack-year smoking history. she has never used smokeless tobacco. She reports that she drinks alcohol. She reports that she does not use drugs.  Allergies  Allergen Reactions  . Levaquin [Levofloxacin In D5w] Diarrhea  . Mobic [Meloxicam]     Blood pressure goes up  . Tramadol     Blood pressure goes up     Family History  Problem Relation Age of Onset  . Cancer Mother        ?  type    Prior to Admission medications   Medication Sig Start Date End Date Taking? Authorizing Provider  acetaminophen (TYLENOL) 325 MG tablet Take 650 mg by mouth every 6 (six) hours as needed for moderate pain.   Yes [provider]  benzonatate (TESSALON PERLES) 100 MG capsule Take 100 mg by mouth at bedtime.    Yes [provider]  carvedilol (COREG) 3.125 MG tablet Take 1 tablet (3.125 mg total) by mouth 2 (two) times daily with a meal. 08/06/14  Yes Reed, Tiffany L, DO  divalproex (DEPAKOTE SPRINKLE) 125 MG capsule Take 250 mg by mouth 2 (two) times daily.  05/09/17  Yes [provider]  furosemide (LASIX) 80 MG tablet Take 80 mg by mouth as directed. 80 mg in the morning   Yes [provider]  levothyroxine (SYNTHROID, LEVOTHROID) 25 MCG tablet Take 1 tablet by mouth daily. 10/20/14  Yes [provider]  losartan (COZAAR) 25 MG tablet Take 25 mg by mouth daily.   Yes [provider]  Melatonin 10 MG TABS Take 10 mg by mouth at bedtime.    Yes [provider]  menthol-cetylpyridinium (CEPACOL) 3 MG lozenge Take 1 lozenge by mouth as needed for sore throat.   Yes [provider]  potassium chloride SA (K-DUR,KLOR-CON) 20 MEQ tablet Take 40 mEq by mouth daily.   Yes [provider]  sertraline (ZOLOFT) 100 MG tablet Take 1 tablet (100 mg total) by mouth daily. 04/02/17  Yes Reed, Tiffany L, DO  ipratropium-albuterol (DUONEB) 0.5-2.5 (3) MG/3ML SOLN Take 3 mLs by nebulization every 6 (six) hours as needed (wheezing and sob).     [provider]  Menthol, Topical Analgesic, (BIOFREEZE) 4 % GEL Apply 1 application topically 2 (two) times daily as needed (shoulder pain).     [provider]  Nutritional Supplements (BOOST BREEZE PO) Take by mouth. One can twice daily    [provider]    Physical Exam: Vitals:   06/30/17 1857 06/30/17 2235  BP: (!) 150/63 (!) 142/56  Pulse: 63 69  Resp: 15 20  Temp: (!) 97.5 F (36.4 C)   TempSrc: Oral   SpO2: 92% 93%      Constitutional: Moderately built and nourished. Vitals:   06/30/17 1857 06/30/17 2235  BP: (!) 150/63 (!) 142/56  Pulse: 63 69  Resp: 15 20  Temp: (!) 97.5 F (36.4 C)   TempSrc: Oral   SpO2: 92% 93%   Eyes: Anicteric no pallor.  Periorbital ecchymotic areas. ENMT:.  Periorbital ecchymotic areas.  No discharge from the ears eyes nose or mouth. Neck: No neck rigidity no mass felt. Respiratory: No rhonchi or crepitations. Cardiovascular: S1-S2 heard no murmurs appreciated. Abdomen: Soft nontender bowel sounds  present. Musculoskeletal: Right hip has pain on movement. Skin: Periorbital ecchymotic areas. Neurologic: Alert awake oriented to her name and place.  Moves all extremities. Psychiatric: Has mild dementia.   Labs on Admission: I have personally reviewed following labs and imaging studies  CBC: Recent Labs  Lab 06/30/17 2056  WBC 9.7  NEUTROABS 8.3*  HGB 11.4*  HCT 34.4*  MCV 88.4  PLT 093   Basic Metabolic Panel: Recent Labs  Lab 06/30/17 2056  NA 139  K 4.6  CL 102  CO2 26  GLUCOSE 132*  BUN 34*  CREATININE 1.06*  CALCIUM 9.2   GFR: CrCl cannot be calculated (Unknown ideal weight.). Liver Function Tests: No results for input(s): AST, ALT, ALKPHOS, BILITOT, PROT,  ALBUMIN in the last 168 hours. No results for input(s): LIPASE, AMYLASE in the last 168 hours. No results for input(s): AMMONIA in the last 168 hours. Coagulation Profile: Recent Labs  Lab 06/30/17 2056  INR 1.23   Cardiac Enzymes: No results for input(s): CKTOTAL, CKMB, CKMBINDEX, TROPONINI in the last 168 hours. BNP (last 3 results) No results for input(s): PROBNP in the last 8760 hours. HbA1C: No results for input(s): HGBA1C in the last 72 hours. CBG: No results for input(s): GLUCAP in the last 168 hours. Lipid Profile: No results for input(s): CHOL, HDL, LDLCALC, TRIG, CHOLHDL, LDLDIRECT in the last 72 hours. Thyroid Function Tests: No results for input(s): TSH, T4TOTAL, FREET4, T3FREE, THYROIDAB in the last 72 hours. Anemia Panel: No results for input(s): VITAMINB12, FOLATE, FERRITIN, TIBC, IRON, RETICCTPCT in the last 72 hours. Urine analysis: No results found for: COLORURINE, APPEARANCEUR, LABSPEC, PHURINE, GLUCOSEU, HGBUR, BILIRUBINUR, KETONESUR, PROTEINUR, UROBILINOGEN, NITRITE, LEUKOCYTESUR Sepsis Labs: @LABRCNTIP (procalcitonin:4,lacticidven:4) )No results found for this or any previous visit (from the past 240 hour(s)).   Radiological Exams on Admission: Dg Chest 2 View  Result  Date: 06/30/2017 CLINICAL DATA:  Fall with hip pain, history of breast cancer EXAM: CHEST  2 VIEW COMPARISON:  12/14/2014, 08/24/2013 FINDINGS: Ground-glass opacity and consolidation in the right upper lobe. No pleural effusion. Cardiomegaly with aortic atherosclerosis. No pneumothorax. Severe chronic deformity of the right shoulder. Multiple old right-sided rib fractures IMPRESSION: 1. Ground-glass opacity and consolidation in the right upper lobe, possibly a pneumonia however imaging follow-up to resolution recommended to exclude underlying mass 2. Moderate severe cardiomegaly without edema Electronically Signed   By: Donavan Foil M.D.   On: 06/30/2017 20:43   Ct Head Wo Contrast  Result Date: 06/30/2017 CLINICAL DATA:  Head trauma unwitnessed fall EXAM: CT HEAD WITHOUT CONTRAST CT CERVICAL SPINE WITHOUT CONTRAST TECHNIQUE: Multidetector CT imaging of the head and cervical spine was performed following the standard protocol without intravenous contrast. Multiplanar CT image reconstructions of the cervical spine were also generated. COMPARISON:  None. FINDINGS: CT HEAD FINDINGS Brain: No acute territorial infarction, hemorrhage, or intracranial mass is visualized. Moderate-to-marked atrophy. Mild to moderate subcortical and periventricular white matter hypodensity consistent with small vessel ischemic changes. Prominent ventricles, felt related to atrophy Vascular: No hyperdense vessels.  Carotid artery calcifications. Skull: No fracture or suspicious lesion Sinuses/Orbits: Mild mucosal thickening in the ethmoid and maxillary sinuses. No acute orbital abnormality. Other: None CT CERVICAL SPINE FINDINGS Alignment: No subluxation.  Facet alignment is within normal limits. Skull base and vertebrae: No acute fracture. No primary bone lesion or focal pathologic process. Soft tissues and spinal canal: No prevertebral fluid or swelling. No visible canal hematoma. Disc levels: Moderate severe diffuse degenerative  changes C3 through C7. Multiple level bilateral facet arthropathy with multilevel bilateral foraminal stenosis. Upper chest: Partially visualized ground-glass density in the right upper lobe. Mild apical emphysema. Aortic atherosclerosis. No thyroid mass Other: None IMPRESSION: 1. No CT evidence for acute intracranial abnormality. Atrophy and small vessel ischemic changes of the white matter 2. Degenerative changes of the cervical spine. No acute osseous abnormality 3. Mild apical emphysema. Partially visualized ground-glass density in the right upper lobe suspicious for pneumonia Electronically Signed   By: Donavan Foil M.D.   On: 06/30/2017 20:54   Ct Cervical Spine Wo Contrast  Result Date: 06/30/2017 CLINICAL DATA:  Head trauma unwitnessed fall EXAM: CT HEAD WITHOUT CONTRAST CT CERVICAL SPINE WITHOUT CONTRAST TECHNIQUE: Multidetector CT imaging of the head and cervical spine was performed  following the standard protocol without intravenous contrast. Multiplanar CT image reconstructions of the cervical spine were also generated. COMPARISON:  None. FINDINGS: CT HEAD FINDINGS Brain: No acute territorial infarction, hemorrhage, or intracranial mass is visualized. Moderate-to-marked atrophy. Mild to moderate subcortical and periventricular white matter hypodensity consistent with small vessel ischemic changes. Prominent ventricles, felt related to atrophy Vascular: No hyperdense vessels.  Carotid artery calcifications. Skull: No fracture or suspicious lesion Sinuses/Orbits: Mild mucosal thickening in the ethmoid and maxillary sinuses. No acute orbital abnormality. Other: None CT CERVICAL SPINE FINDINGS Alignment: No subluxation.  Facet alignment is within normal limits. Skull base and vertebrae: No acute fracture. No primary bone lesion or focal pathologic process. Soft tissues and spinal canal: No prevertebral fluid or swelling. No visible canal hematoma. Disc levels: Moderate severe diffuse degenerative  changes C3 through C7. Multiple level bilateral facet arthropathy with multilevel bilateral foraminal stenosis. Upper chest: Partially visualized ground-glass density in the right upper lobe. Mild apical emphysema. Aortic atherosclerosis. No thyroid mass Other: None IMPRESSION: 1. No CT evidence for acute intracranial abnormality. Atrophy and small vessel ischemic changes of the white matter 2. Degenerative changes of the cervical spine. No acute osseous abnormality 3. Mild apical emphysema. Partially visualized ground-glass density in the right upper lobe suspicious for pneumonia Electronically Signed   By: Donavan Foil M.D.   On: 06/30/2017 20:54   Dg Hip Unilat With Pelvis 2-3 Views Right  Result Date: 06/30/2017 CLINICAL DATA:  Right hip pain due to a fall today. Initial encounter. EXAM: DG HIP (WITH OR WITHOUT PELVIS) 2-3V RIGHT COMPARISON:  None. FINDINGS: The patient has an acute right intertrochanteric fracture. No other acute bony or joint abnormality is seen. IMPRESSION: Acute right intertrochanteric fracture. Electronically Signed   By: Inge Rise M.D.   On: 06/30/2017 20:41    EKG: Independently reviewed.  Normal sinus rhythm.  Assessment/Plan Principal Problem:   Closed right hip fracture, initial encounter [facility]) Active Problems:   Malignant neoplasm of female breast ([place])   Memory loss   Hypothyroidism (acquired)   Hypertension   Chronic combined systolic and diastolic CHF (congestive heart failure) (HCC)   Pulmonary infiltrates   Hip fracture (HCC)    1. Closed right hip fracture possibly from mechanical fall -orthopedic surgeon Dr. Elmyra Ricks was consulted and is planning to have surgery done on July 02, 2017.  Patient will be placed on pain relief medications.  Patient will be at moderate risk for intermediate risk procedure. 2. Chronic combined systolic and diastolic heart failure last EF measured in 2016 was 15-20%.  Appears compensated.  Patient is on Cozaar and  Coreg which will be continued.  Takes as needed Lasix.  Will hold Lasix for now.  Restart when as indicated after surgery. 3. Hypothyroidism on Synthroid. 4. Chronic pulmonary infiltrates in the right upper lobe.  No signs of pneumonia clinically.  Will check procalcitonin levels. 5. History of memory loss with previous aggressive behavior on Depakote.  Check Depakote levels. 6. History of depression on Zoloft. 7. Mild anemia appears to be chronic.  Follow CBC. 8. Previous history of breast cancer.  DVT prophylaxis: SCDs. Code Status: DNR. Family Communication: No family at the bedside. Disposition Plan: Skilled nursing facility. Consults called: Orthopedic surgeon. Admission status: Inpatient.   [name] MD [facility] Pager [phone].  If 7PM-7AM, please contact night-coverage [email or web address] Password [facility]  07/01/2017, 12:02 AM

## 2017-07-01 NOTE — Progress Notes (Signed)
I have seen and examined the patient. She has a right intertrochanteric femur fracture from a fall yesterday. Will plan on operative fixation tomorrow afternoon. Discussed with patient who would like to proceed. Full formal consult will be in chart later tonight.

## 2017-07-01 NOTE — Consult Note (Signed)
Reason for Consult:  Right hip pain following a fall   Referring Physician: [name] MD [facility]   Tara Garrett is an 84 y.o. female.  HPI: Patient is a 84 year old female admitted for right sided pain following a fall. She currently lives at [facility].  She sustained injury during a fall.  She states that she fell yesterday bu unable to recall specific details concerning the fall.  She was transported to the ED where radiographs proved to be positive for a right intertrochanteric fracture.  When questioned, she points to her right hip and thigh region during morning rounds. It is felt due to the nature of the fracture, she will need to have the fracture fixed.  She has been admitted by Medical Service and placed at bedrest.  Once optimized for surgery, will plan on fixing the hip tomorrow (Wednesday) afternoon.  Allow diet today and then clear liquids in the morning.  Will make her NPO after liquid breakfast in the AM.   Past Medical History:  Diagnosis Date  . Closed fracture of two ribs 10/21/2011, 08/24/2013  . Closed fracture of unspecified part of upper end of humerus 01/13/2005  . Fall 10-09-2011   fell outdoors on driveway fell and hit right side of body and face  . Female stress incontinence 02/10/2004  . Gait disorder 08/25/2013   Poor balance   . Herpes zoster without mention of complication 36/46/8032  . Hyperglycemia   . Hyperlipidemia   . Hypertension   . Hypothyroidism   . Keratosis seborrheica   . Left knee pain   . Leg pain   . Macular degeneration   . Malignant neoplasm of breast (female), unspecified site   . Multiple fractures of ribs of left side 08/24/2013  . Neoplasm of breast    right breast cancer  . Open wound of knee, leg (except thigh), and ankle, without mention of complication 07/02/4823  . Osteoporosis   . Other malaise and fatigue 01/06/2012  . Other specified circulatory system disorders 10/09/2011  . Pain in joint, pelvic region and  thigh 05/11/2012  . Pain in joint, site unspecified 02/03/39  . Phlebitis and thrombophlebitis of other deep vessels of lower extremities 01/06/2012  . Right shoulder injury 12/2009  . Shoulder pain, right 10/2009   X- rayed at Dr. Berenice Primas.Severe Degenerative Disease. Recommended an artificial shoulder. Saw Dr. Tamera Punt .  Marland Kitchen Varicose vein 2014  . Varicose veins   . Vitamin D deficiency     Past Surgical History:  Procedure Laterality Date  . ABDOMINAL HYSTERECTOMY  1967  . CATARACT EXTRACTION  04/2004   Dr. Bing Plume  . colon polyps  1989, 1992, 1995, 1997  . ENDOVENOUS ABLATION SAPHENOUS VEIN W/ LASER  01-30-2012   left greater saphenous vein and sclerotherapy left leg  . left cataract  1999  . MOLE REMOVAL  03/04/2009   on back by Dr. Derrel Nip  . right mastectomy  1989  . VESICOVAGINAL FISTULA CLOSURE W/ TAH  1967    Family History  Problem Relation Age of Onset  . Cancer Mother        ? type    Social History:  reports that she quit smoking about 39 years ago. Her smoking use included cigarettes. She has a 10.00 pack-year smoking history. she has never used smokeless tobacco. She reports that she drinks alcohol. She reports that she does not use drugs.  Allergies:  Allergies  Allergen Reactions  . Levaquin [Levofloxacin  In D5w] Diarrhea  . Mobic [Meloxicam]     Blood pressure goes up  . Tramadol     Blood pressure goes up     Medications: acetaminophen (TYLENOL) 325 MG tablet Take 650 mg by mouth every 6 (six) hours as needed for moderate pain. [name], MD Not Ordered  benzonatate (TESSALON PERLES) 100 MG capsule Take 100 mg by mouth at bedtime.  [name], MD Not Ordered  carvedilol (COREG) 3.125 MG tablet Take 1 tablet (3.125 mg total) by mouth 2 (two) times daily with a meal. [name], MD Reordered  Ordered as: carvedilol (COREG) tablet 3.125 mg - 3.125 mg, Oral, 2 times daily with meals, First dose on Tue 07/01/17 at 0800  divalproex  (DEPAKOTE SPRINKLE) 125 MG capsule Take 250 mg by mouth 2 (two) times daily.  [name], MD Reordered  Ordered as: divalproex (DEPAKOTE SPRINKLE) capsule 250 mg - 250 mg, Oral, 2 times daily, First dose on Tue 07/01/17 at 0015  furosemide (LASIX) 80 MG tablet Take 80 mg by mouth as directed. 80 mg in the morning [name], MD Not Ordered  levothyroxine (SYNTHROID, LEVOTHROID) 25 MCG tablet Take 1 tablet by mouth daily. [name], MD Reordered  Ordered as: levothyroxine (SYNTHROID, LEVOTHROID) tablet 25 mcg - 25 mcg, Oral, Daily before breakfast, First dose on Tue 07/01/17 at 0800  losartan (COZAAR) 25 MG tablet Take 25 mg by mouth daily. [name], MD Reordered  Ordered as: losartan (COZAAR) tablet 25 mg - 25 mg, Oral, Daily, First dose on Tue 07/01/17 at 1000  Melatonin 10 MG TABS Take 10 mg by mouth at bedtime.  [name], MD Not Ordered  menthol-cetylpyridinium (CEPACOL) 3 MG lozenge Take 1 lozenge by mouth as needed for sore throat. [name], MD Not Ordered  potassium chloride SA (K-DUR,KLOR-CON) 20 MEQ tablet Take 40 mEq by mouth daily. [name], MD Not Ordered  sertraline (ZOLOFT) 100 MG tablet Take 1 tablet (100 mg total) by mouth daily. [name], MD Reordered  Ordered as: sertraline (ZOLOFT) tablet 100 mg - 100 mg, Oral, Daily, First dose on Tue 07/01/17 at 1000  ipratropium-albuterol (DUONEB) 0.5-2.5 (3) MG/3ML SOLN Take 3 mLs by nebulization every 6 (six) hours as needed (wheezing and sob).  [name], MD Reordered  Ordered as: ipratropium-albuterol (DUONEB) 0.5-2.5 (3) MG/3ML nebulizer solution 3 mL - 3 mL, Nebulization, Every 6 hours PRN, wheezing and sob, Starting Mon 06/30/17 at 2359  Menthol, Topical Analgesic, (BIOFREEZE) 4 % GEL Apply 1 application topically 2 (two) times daily as needed (shoulder pain).  [name], MD Not Ordered  Nutritional Supplements (BOOST BREEZE PO) Take  by mouth. One can twice daily [name], MD Not Ordered     Results for orders placed or performed during the hospital encounter of 06/30/17 (from the past 48 hour(s))  Basic metabolic panel     Status: Abnormal   Collection Time: 06/30/17  8:56 PM  Result Value Ref Range   Sodium 139 135 - 145 mmol/L   Potassium 4.6 3.5 - 5.1 mmol/L   Chloride 102 101 - 111 mmol/L   CO2 26 22 - 32 mmol/L   Glucose, Bld 132 (H) 65 - 99 mg/dL   BUN 34 (H) 6 - 20 mg/dL   Creatinine, Ser 1.06 (H) 0.44 - 1.00 mg/dL   Calcium 9.2 8.9 - 10.3 mg/dL   GFR calc non Af Amer 44 (L) >  60 mL/min   GFR calc Af Amer 51 (L) >60 mL/min    Comment: (NOTE) The eGFR has been calculated using the CKD EPI equation. This calculation has not been validated in all clinical situations. eGFR's persistently <60 mL/min signify possible Chronic Kidney Disease.    Anion gap 11 5 - 15  CBC WITH DIFFERENTIAL     Status: Abnormal   Collection Time: 06/30/17  8:56 PM  Result Value Ref Range   WBC 9.7 4.0 - 10.5 K/uL   RBC 3.89 3.87 - 5.11 MIL/uL   Hemoglobin 11.4 (L) 12.0 - 15.0 g/dL   HCT 34.4 (L) 36.0 - 46.0 %   MCV 88.4 78.0 - 100.0 fL   MCH 29.3 26.0 - 34.0 pg   MCHC 33.1 30.0 - 36.0 g/dL   RDW 15.2 11.5 - 15.5 %   Platelets 154 150 - 400 K/uL   Neutrophils Relative % 86 %   Neutro Abs 8.3 (H) 1.7 - 7.7 K/uL   Lymphocytes Relative 6 %   Lymphs Abs 0.6 (L) 0.7 - 4.0 K/uL   Monocytes Relative 7 %   Monocytes Absolute 0.6 0.1 - 1.0 K/uL   Eosinophils Relative 1 %   Eosinophils Absolute 0.1 0.0 - 0.7 K/uL   Basophils Relative 0 %   Basophils Absolute 0.0 0.0 - 0.1 K/uL  Protime-INR     Status: Abnormal   Collection Time: 06/30/17  8:56 PM  Result Value Ref Range   Prothrombin Time 15.4 (H) 11.4 - 15.2 seconds   INR 1.23   Type and screen [facility]     Status: None   Collection Time: 06/30/17  8:56 PM  Result Value Ref Range   ABO/RH(D) A POS    Antibody Screen NEG    Sample  Expiration 07/03/2017   ABO/Rh     Status: None   Collection Time: 06/30/17  8:58 PM  Result Value Ref Range   ABO/RH(D) A POS   Surgical PCR screen     Status: Abnormal   Collection Time: 07/01/17  2:30 AM  Result Value Ref Range   MRSA, PCR NEGATIVE NEGATIVE   Staphylococcus aureus POSITIVE (A) NEGATIVE    Comment: (NOTE) The Xpert SA Assay (FDA approved for NASAL specimens in patients 81 years of age and older), is one component of a comprehensive surveillance program. It is not intended to diagnose infection nor to guide or monitor treatment.   CBC     Status: Abnormal   Collection Time: 07/01/17  5:50 AM  Result Value Ref Range   WBC 10.8 (H) 4.0 - 10.5 K/uL   RBC 3.32 (L) 3.87 - 5.11 MIL/uL   Hemoglobin 9.7 (L) 12.0 - 15.0 g/dL   HCT 29.1 (L) 36.0 - 46.0 %   MCV 87.7 78.0 - 100.0 fL   MCH 29.2 26.0 - 34.0 pg   MCHC 33.3 30.0 - 36.0 g/dL   RDW 15.2 11.5 - 15.5 %   Platelets 140 (L) 150 - 400 K/uL  Basic metabolic panel     Status: Abnormal   Collection Time: 07/01/17  5:50 AM  Result Value Ref Range   Sodium 139 135 - 145 mmol/L   Potassium 4.2 3.5 - 5.1 mmol/L   Chloride 104 101 - 111 mmol/L   CO2 26 22 - 32 mmol/L   Glucose, Bld 162 (H) 65 - 99 mg/dL   BUN 32 (H) 6 - 20 mg/dL   Creatinine, Ser 1.02 (H) 0.44 - 1.00  mg/dL   Calcium 8.7 (L) 8.9 - 10.3 mg/dL   GFR calc non Af Amer 46 (L) >60 mL/min   GFR calc Af Amer 54 (L) >60 mL/min    Comment: (NOTE) The eGFR has been calculated using the CKD EPI equation. This calculation has not been validated in all clinical situations. eGFR's persistently <60 mL/min signify possible Chronic Kidney Disease.    Anion gap 9 5 - 15  Procalcitonin - Baseline     Status: None   Collection Time: 07/01/17  7:45 AM  Result Value Ref Range   Procalcitonin <0.10 ng/mL    Comment:        Interpretation: PCT (Procalcitonin) <= 0.5 ng/mL: Systemic infection (sepsis) is not likely. Local bacterial infection is possible. (NOTE)        Sepsis PCT Algorithm           Lower Respiratory Tract                                      Infection PCT Algorithm    ----------------------------     ----------------------------         PCT < 0.25 ng/mL                PCT < 0.10 ng/mL         Strongly encourage             Strongly discourage   discontinuation of antibiotics    initiation of antibiotics    ----------------------------     -----------------------------       PCT 0.25 - 0.50 ng/mL            PCT 0.10 - 0.25 ng/mL               OR       >80% decrease in PCT            Discourage initiation of                                            antibiotics      Encourage discontinuation           of antibiotics    ----------------------------     -----------------------------         PCT >= 0.50 ng/mL              PCT 0.26 - 0.50 ng/mL               AND        <80% decrease in PCT             Encourage initiation of                                             antibiotics       Encourage continuation           of antibiotics    ----------------------------     -----------------------------        PCT >= 0.50 ng/mL                  PCT > 0.50 ng/mL  AND         increase in PCT                  Strongly encourage                                      initiation of antibiotics    Strongly encourage escalation           of antibiotics                                     -----------------------------                                           PCT <= 0.25 ng/mL                                                 OR                                        > 80% decrease in PCT                                     Discontinue / Do not initiate                                             antibiotics   Valproic acid level     Status: None   Collection Time: 07/01/17  7:45 AM  Result Value Ref Range   Valproic Acid Lvl 68 50.0 - 100.0 ug/mL    Dg Chest 2 View  Result Date: 06/30/2017 CLINICAL DATA:  Fall with hip pain,  history of breast cancer EXAM: CHEST  2 VIEW COMPARISON:  12/14/2014, 08/24/2013 FINDINGS: Ground-glass opacity and consolidation in the right upper lobe. No pleural effusion. Cardiomegaly with aortic atherosclerosis. No pneumothorax. Severe chronic deformity of the right shoulder. Multiple old right-sided rib fractures IMPRESSION: 1. Ground-glass opacity and consolidation in the right upper lobe, possibly a pneumonia however imaging follow-up to resolution recommended to exclude underlying mass 2. Moderate severe cardiomegaly without edema Electronically Signed   By: Donavan Foil M.D.   On: 06/30/2017 20:43   Ct Head Wo Contrast  Result Date: 06/30/2017 CLINICAL DATA:  Head trauma unwitnessed fall EXAM: CT HEAD WITHOUT CONTRAST CT CERVICAL SPINE WITHOUT CONTRAST TECHNIQUE: Multidetector CT imaging of the head and cervical spine was performed following the standard protocol without intravenous contrast. Multiplanar CT image reconstructions of the cervical spine were also generated. COMPARISON:  None. FINDINGS: CT HEAD FINDINGS Brain: No acute territorial infarction, hemorrhage, or intracranial mass is visualized. Moderate-to-marked atrophy. Mild to moderate subcortical and periventricular white matter hypodensity consistent with small vessel ischemic changes. Prominent ventricles, felt related to atrophy Vascular: No hyperdense vessels.  Carotid artery calcifications. Skull: No  fracture or suspicious lesion Sinuses/Orbits: Mild mucosal thickening in the ethmoid and maxillary sinuses. No acute orbital abnormality. Other: None CT CERVICAL SPINE FINDINGS Alignment: No subluxation.  Facet alignment is within normal limits. Skull base and vertebrae: No acute fracture. No primary bone lesion or focal pathologic process. Soft tissues and spinal canal: No prevertebral fluid or swelling. No visible canal hematoma. Disc levels: Moderate severe diffuse degenerative changes C3 through C7. Multiple level bilateral facet  arthropathy with multilevel bilateral foraminal stenosis. Upper chest: Partially visualized ground-glass density in the right upper lobe. Mild apical emphysema. Aortic atherosclerosis. No thyroid mass Other: None IMPRESSION: 1. No CT evidence for acute intracranial abnormality. Atrophy and small vessel ischemic changes of the white matter 2. Degenerative changes of the cervical spine. No acute osseous abnormality 3. Mild apical emphysema. Partially visualized ground-glass density in the right upper lobe suspicious for pneumonia Electronically Signed   By: Donavan Foil M.D.   On: 06/30/2017 20:54   Ct Cervical Spine Wo Contrast  Result Date: 06/30/2017 CLINICAL DATA:  Head trauma unwitnessed fall EXAM: CT HEAD WITHOUT CONTRAST CT CERVICAL SPINE WITHOUT CONTRAST TECHNIQUE: Multidetector CT imaging of the head and cervical spine was performed following the standard protocol without intravenous contrast. Multiplanar CT image reconstructions of the cervical spine were also generated. COMPARISON:  None. FINDINGS: CT HEAD FINDINGS Brain: No acute territorial infarction, hemorrhage, or intracranial mass is visualized. Moderate-to-marked atrophy. Mild to moderate subcortical and periventricular white matter hypodensity consistent with small vessel ischemic changes. Prominent ventricles, felt related to atrophy Vascular: No hyperdense vessels.  Carotid artery calcifications. Skull: No fracture or suspicious lesion Sinuses/Orbits: Mild mucosal thickening in the ethmoid and maxillary sinuses. No acute orbital abnormality. Other: None CT CERVICAL SPINE FINDINGS Alignment: No subluxation.  Facet alignment is within normal limits. Skull base and vertebrae: No acute fracture. No primary bone lesion or focal pathologic process. Soft tissues and spinal canal: No prevertebral fluid or swelling. No visible canal hematoma. Disc levels: Moderate severe diffuse degenerative changes C3 through C7. Multiple level bilateral facet  arthropathy with multilevel bilateral foraminal stenosis. Upper chest: Partially visualized ground-glass density in the right upper lobe. Mild apical emphysema. Aortic atherosclerosis. No thyroid mass Other: None IMPRESSION: 1. No CT evidence for acute intracranial abnormality. Atrophy and small vessel ischemic changes of the white matter 2. Degenerative changes of the cervical spine. No acute osseous abnormality 3. Mild apical emphysema. Partially visualized ground-glass density in the right upper lobe suspicious for pneumonia Electronically Signed   By: Donavan Foil M.D.   On: 06/30/2017 20:54   Dg Hip Unilat With Pelvis 2-3 Views Right  Result Date: 06/30/2017 CLINICAL DATA:  Right hip pain due to a fall today. Initial encounter. EXAM: DG HIP (WITH OR WITHOUT PELVIS) 2-3V RIGHT COMPARISON:  None. FINDINGS: The patient has an acute right intertrochanteric fracture. No other acute bony or joint abnormality is seen. IMPRESSION: Acute right intertrochanteric fracture. Electronically Signed   By: Inge Rise M.D.   On: 06/30/2017 20:41    ROS Blood pressure (!) 140/53, pulse 64, temperature 97.7 F (36.5 C), temperature source Oral, resp. rate 16, height _0  (1.448 m), weight 45.8 kg (101 lb), SpO2 95 %. Physical Exam  Patient is a thin, frail appearing petite 84 year old female.  She appears alert at time of exam and dose answer some questions appropriately.  She is unable to recall specific details surround the fall though.  Right lower extremity - Right knee and hip is flexed  for position of comfort. Motor - she is able to move her foot and toes on command during the exam Pulses - DP, PT and Pop pulses are noted intact Moderate pain is noted on internal/external hip roll.  EXAM: DG HIP (WITH OR WITHOUT PELVIS) 2-3V RIGHT IMPRESSION: Acute right intertrochanteric fracture.  Assessment/Plan: Right Intertrochanteric Hip Fracture  Admit for bedrest. Plan for surgery once optimized by  Medical Service.  Plan will be for surgery Wednesday afternoon. NPO tomorrow morning following a clear liquid breakfast. Consent for surgery. ABX on call to OR. Type and Screen - HGB 9.7 today, recheck.  Amias Hutchinson 07/01/2017, 11:56 AM

## 2017-07-01 NOTE — Progress Notes (Signed)
Patient is forgetful, AOx2-3. Reported place as [facility] because [facility] didn't have beds, did not know the date on any level. Situation she reported as "I fell 8 feet from my bathroom trying to walk in there. I'm going to have surgery to fix it tomorrow." Patient verbalizes that the doctor did come in to speak with her about the surgery but she does not remember what the surgery is or any possible risks. MPOA has signed consent for patient.

## 2017-07-01 NOTE — Progress Notes (Signed)
PROGRESS NOTE    Tara Garrett  RCV:[id number] DOB: May 07, 1923 DOA: 06/30/2017 PCP: [name], DO  Brief Narrative:Tara Garrett is a 84 y.o. female with history of dementia, chronic systolic CHF, hypertension, anemia, bipolar disorder, hypothyroidism was brought to the ER after patient had an unwitnessed fall.   -Patient reports that this was a mechanical fall, denies any loss of consciousness, denies chest pain palpitations or dyspnea -In the ED was noted to have right hip fracture, orthopedics consulted  Assessment & Plan:   Principal Problem:   Closed right hip fracture, initial encounter ([place]) -Following a mechanical fall -Orthopedics consulting -She is moderate risk for intermediate risk procedure, compensated from a congestive heart failure standpoint -No further workup recommended at this time -She will continue her Coreg perioperatively, hold Lasix for now  Chronic systolic and diastolic CHF -Last EF was was 15-20% in 2016 -Clinically compensated, hold Lasix, continue Coreg and Cozaar -Caution with excessive fluid administration perioperatively  Chronic pulmonary infiltrates -Asymptomatic, followed by about pulmonary Dr. Christinia Gully -Does not need any treatment/workup for this at this time  History of mild dementia and aggressive behavior -Continue home regimen of Depakote  Rest of her chronic medical problems remain stable -Please see today's H&P for details, she is a DO NOT RESUSCITATE    DVT prophylaxis: SCds, start post op Code Status: DNR Family Communication: No family at bedside Disposition Plan: Wi likely need SNF postop   Consultants:   Orthopedics   Procedures:   Antimicrobials:    Subjective: -Feels okay, denies any chest pain dyspnea or palpitations Report some mild pain at the right hip only with movement-  Objective: Vitals:   07/01/17 0039 07/01/17 0216 07/01/17 0523 07/01/17 1000  BP:   (!) 140/53   Pulse:   64   Resp:   16    Temp:   97.7 F (36.5 C)   TempSrc:   Oral   SpO2: 96%  95%   Weight:    45.8 kg (101 lb)  Height:  4\' 9"  (1.448 m)      Intake/Output Summary (Last 24 hours) at 07/01/2017 1316 Last data filed at 07/01/2017 1012 Gross per 24 hour  Intake 580 ml  Output 750 ml  Net -170 ml   Filed Weights   07/01/17 1000  Weight: 45.8 kg (101 lb)    Examination:  General exam: Appears calm and comfortable, No distress, pleasant female  Respiratory system: Clear to auscultation. Respiratory effort normal. Cardiovascular system: S1 & S2 heard, RRR. No murmurs Gastrointestinal system: Abdomen is nondistended, soft and nontender.Normal bowel sounds heard. Central nervous system: Alert and oriented x2 Extremities: No edema , right leg shortened and mildly externally rotated  Skin: No rashes, lesions or ulcers Psychiatry: Judgement and insight appear normal. Mood & affect appropriate.     Data Reviewed:   CBC: Recent Labs  Lab 06/30/17 2056 07/01/17 0550  WBC 9.7 10.8*  NEUTROABS 8.3*  --   HGB 11.4* 9.7*  HCT 34.4* 29.1*  MCV 88.4 87.7  PLT 154 102*   Basic Metabolic Panel: Recent Labs  Lab 06/30/17 2056 07/01/17 0550  NA 139 139  K 4.6 4.2  CL 102 104  CO2 26 26  GLUCOSE 132* 162*  BUN 34* 32*  CREATININE 1.06* 1.02*  CALCIUM 9.2 8.7*   GFR: Estimated Creatinine Clearance: 21.4 mL/min (A) (by C-G formula based on SCr of 1.02 mg/dL (H)). Liver Function Tests: No results for input(s): AST, ALT, ALKPHOS, BILITOT,  PROT, ALBUMIN in the last 168 hours. No results for input(s): LIPASE, AMYLASE in the last 168 hours. No results for input(s): AMMONIA in the last 168 hours. Coagulation Profile: Recent Labs  Lab 06/30/17 2056  INR 1.23   Cardiac Enzymes: No results for input(s): CKTOTAL, CKMB, CKMBINDEX, TROPONINI in the last 168 hours. BNP (last 3 results) No results for input(s): PROBNP in the last 8760 hours. HbA1C: No results for input(s): HGBA1C in the last 72  hours. CBG: No results for input(s): GLUCAP in the last 168 hours. Lipid Profile: No results for input(s): CHOL, HDL, LDLCALC, TRIG, CHOLHDL, LDLDIRECT in the last 72 hours. Thyroid Function Tests: No results for input(s): TSH, T4TOTAL, FREET4, T3FREE, THYROIDAB in the last 72 hours. Anemia Panel: No results for input(s): VITAMINB12, FOLATE, FERRITIN, TIBC, IRON, RETICCTPCT in the last 72 hours. Urine analysis: No results found for: COLORURINE, APPEARANCEUR, LABSPEC, [place], GLUCOSEU, HGBUR, BILIRUBINUR, KETONESUR, PROTEINUR, UROBILINOGEN, NITRITE, LEUKOCYTESUR Sepsis Labs: @LABRCNTIP (procalcitonin:4,lacticidven:4)  ) Recent Results (from the past 240 hour(s))  Surgical PCR screen     Status: Abnormal   Collection Time: 07/01/17  2:30 AM  Result Value Ref Range Status   MRSA, PCR NEGATIVE NEGATIVE Final   Staphylococcus aureus POSITIVE (A) NEGATIVE Final    Comment: (NOTE) The Xpert SA Assay (FDA approved for NASAL specimens in patients 10 years of age and older), is one component of a comprehensive surveillance program. It is not intended to diagnose infection nor to guide or monitor treatment.          Radiology Studies: Dg Chest 2 View  Result Date: 06/30/2017 CLINICAL DATA:  Fall with hip pain, history of breast cancer EXAM: CHEST  2 VIEW COMPARISON:  12/14/2014, 08/24/2013 FINDINGS: Ground-glass opacity and consolidation in the right upper lobe. No pleural effusion. Cardiomegaly with aortic atherosclerosis. No pneumothorax. Severe chronic deformity of the right shoulder. Multiple old right-sided rib fractures IMPRESSION: 1. Ground-glass opacity and consolidation in the right upper lobe, possibly a pneumonia however imaging follow-up to resolution recommended to exclude underlying mass 2. Moderate severe cardiomegaly without edema Electronically Signed   By: Donavan Foil M.D.   On: 06/30/2017 20:43   Ct Head Wo Contrast  Result Date: 06/30/2017 CLINICAL DATA:  Head  trauma unwitnessed fall EXAM: CT HEAD WITHOUT CONTRAST CT CERVICAL SPINE WITHOUT CONTRAST TECHNIQUE: Multidetector CT imaging of the head and cervical spine was performed following the standard protocol without intravenous contrast. Multiplanar CT image reconstructions of the cervical spine were also generated. COMPARISON:  None. FINDINGS: CT HEAD FINDINGS Brain: No acute territorial infarction, hemorrhage, or intracranial mass is visualized. Moderate-to-marked atrophy. Mild to moderate subcortical and periventricular white matter hypodensity consistent with small vessel ischemic changes. Prominent ventricles, felt related to atrophy Vascular: No hyperdense vessels.  Carotid artery calcifications. Skull: No fracture or suspicious lesion Sinuses/Orbits: Mild mucosal thickening in the ethmoid and maxillary sinuses. No acute orbital abnormality. Other: None CT CERVICAL SPINE FINDINGS Alignment: No subluxation.  Facet alignment is within normal limits. Skull base and vertebrae: No acute fracture. No primary bone lesion or focal pathologic process. Soft tissues and spinal canal: No prevertebral fluid or swelling. No visible canal hematoma. Disc levels: Moderate severe diffuse degenerative changes C3 through C7. Multiple level bilateral facet arthropathy with multilevel bilateral foraminal stenosis. Upper chest: Partially visualized ground-glass density in the right upper lobe. Mild apical emphysema. Aortic atherosclerosis. No thyroid mass Other: None IMPRESSION: 1. No CT evidence for acute intracranial abnormality. Atrophy and small vessel ischemic changes of the  white matter 2. Degenerative changes of the cervical spine. No acute osseous abnormality 3. Mild apical emphysema. Partially visualized ground-glass density in the right upper lobe suspicious for pneumonia Electronically Signed   By: Donavan Foil M.D.   On: 06/30/2017 20:54   Ct Cervical Spine Wo Contrast  Result Date: 06/30/2017 CLINICAL DATA:  Head trauma  unwitnessed fall EXAM: CT HEAD WITHOUT CONTRAST CT CERVICAL SPINE WITHOUT CONTRAST TECHNIQUE: Multidetector CT imaging of the head and cervical spine was performed following the standard protocol without intravenous contrast. Multiplanar CT image reconstructions of the cervical spine were also generated. COMPARISON:  None. FINDINGS: CT HEAD FINDINGS Brain: No acute territorial infarction, hemorrhage, or intracranial mass is visualized. Moderate-to-marked atrophy. Mild to moderate subcortical and periventricular white matter hypodensity consistent with small vessel ischemic changes. Prominent ventricles, felt related to atrophy Vascular: No hyperdense vessels.  Carotid artery calcifications. Skull: No fracture or suspicious lesion Sinuses/Orbits: Mild mucosal thickening in the ethmoid and maxillary sinuses. No acute orbital abnormality. Other: None CT CERVICAL SPINE FINDINGS Alignment: No subluxation.  Facet alignment is within normal limits. Skull base and vertebrae: No acute fracture. No primary bone lesion or focal pathologic process. Soft tissues and spinal canal: No prevertebral fluid or swelling. No visible canal hematoma. Disc levels: Moderate severe diffuse degenerative changes C3 through C7. Multiple level bilateral facet arthropathy with multilevel bilateral foraminal stenosis. Upper chest: Partially visualized ground-glass density in the right upper lobe. Mild apical emphysema. Aortic atherosclerosis. No thyroid mass Other: None IMPRESSION: 1. No CT evidence for acute intracranial abnormality. Atrophy and small vessel ischemic changes of the white matter 2. Degenerative changes of the cervical spine. No acute osseous abnormality 3. Mild apical emphysema. Partially visualized ground-glass density in the right upper lobe suspicious for pneumonia Electronically Signed   By: Donavan Foil M.D.   On: 06/30/2017 20:54   Dg Hip Unilat With Pelvis 2-3 Views Right  Result Date: 06/30/2017 CLINICAL DATA:  Right  hip pain due to a fall today. Initial encounter. EXAM: DG HIP (WITH OR WITHOUT PELVIS) 2-3V RIGHT COMPARISON:  None. FINDINGS: The patient has an acute right intertrochanteric fracture. No other acute bony or joint abnormality is seen. IMPRESSION: Acute right intertrochanteric fracture. Electronically Signed   By: Inge Rise M.D.   On: 06/30/2017 20:41        Scheduled Meds: . carvedilol  3.125 mg Oral BID WC  . Chlorhexidine Gluconate Cloth  6 each Topical Daily  . divalproex  250 mg Oral BID  . levothyroxine  25 mcg Oral QAC breakfast  . losartan  25 mg Oral Daily  . mupirocin ointment  1 application Nasal BID  . sertraline  100 mg Oral Daily   Continuous Infusions: .  ceFAZolin (ANCEF) IV       LOS: 1 day    Time spent: 87min    Domenic Polite, MD [facility] Page via [email or web address], password [email or web address] After 7PM please contact night-coverage  07/01/2017, 1:16 PM

## 2017-07-01 NOTE — Progress Notes (Signed)
Nutrition Brief Note  Consult per Hip Fracture Protocol.   Wt Readings from Last 15 Encounters:  07/01/17 101 lb (45.8 kg)  05/28/17 97 lb (44 kg)  04/02/17 98 lb (44.5 kg)  09/25/16 103 lb (46.7 kg)  08/01/16 105 lb (47.6 kg)  07/10/16 102 lb (46.3 kg)  01/24/16 110 lb (49.9 kg)  12/14/15 107 lb 9.6 oz (48.8 kg)  09/27/15 107 lb (48.5 kg)  03/01/15 100 lb (45.4 kg)  01/18/15 101 lb (45.8 kg)  12/21/14 100 lb (45.4 kg)  12/14/14 100 lb 12.8 oz (45.7 kg)  12/06/14 99 lb 12.8 oz (45.3 kg)  11/23/14 103 lb (46.7 kg)    Body mass index is 21.86 kg/m. Patient meets criteria for normal weight based on current BMI. Weight has been stable for the past 2.5 years. Skin WDL. Pt admitted following a fall with associated R hip fx. Per Ortho notes, plan for surgery tomorrow (1/23) afternoon. Per notes, plan for CLD for breakfast tomorrow and then NPO for surgery.   Current diet order is Heart Healthy, patient is consuming approximately 50% of meals at this time.  Medications reviewed; 25 mcg oral Synthroid/day. Labs reviewed; BUN: 32 mg/dL, creatinine: 1.02 mg/dL, Ca: 8.7 mg/dL, GFR: 46 mL/min.   Recommend that once diet re-advanced after surgery, she be allowed Regular diet (not Heart Healthy) given age. No additional nutrition interventions warranted at this time. If nutrition issues arise, please consult RD.     Jarome Matin, MS, RD, LDN, [facility] Inpatient Clinical Dietitian Pager # [phone] After hours/weekend pager # [phone]

## 2017-07-01 NOTE — Clinical Social Work Note (Addendum)
Clinical Social Work Assessment  Patient Details  Name: Tara Garrett MRN: [id number] Date of Birth: Oct 18, 1922  Date of referral:  07/01/17               Reason for consult:  Facility Placement, Discharge Planning                Permission sought to share information with:  Customer service manager, Other Permission granted to share information::  Yes, Verbal Permission Granted  Name::        Agency::  [facility]   Relationship::  Friend [name]  Contact Information:     Housing/Transportation Living arrangements for the past 2 months:  [place] of Information:  Patient Patient Interpreter Needed:  None Criminal Activity/Legal Involvement Pertinent to Current Situation/Hospitalization:  No - Comment as needed Significant Relationships:  Warehouse manager Lives with:  Facility Resident, Self Do you feel safe going back to the place where you live?  Yes Need for family participation in patient care:  Yes (Dementia, Dependent with mobility)  Care giving concerns:   Patient presented to ER after unwitnessed fall at [place].  Patient was noted to have right hip fracture. Patient will undergo surgical intervention.   Social Worker assessment / plan:  CSW met with patient at beside and explain role. Patient alert to self, place and has history of dementia. Patient reports she does not have any family but gave CSW permission to contact friend listed on emergency contact list.   CSW contacted friend Ms. [name], she reports she is the patient longtime friend POA and [place]. She plans to bring the paperwork when she visit this evening. She reports she has also arranged for 24 attending caregiver to sit with the patient at bedside.  She reports the patient lives in assisted living facility at [facility] and is able to feed, bathe herself with limited assistance.  CSW called and left voicemail for Butch Penny w/ [place] to assist with discharge  planning  Plan: Assist with discharge back to [facility]  Employment status:  Retired Forensic scientist:  Managed Medicare PT Recommendations:  Not assessed at this time Information / Referral to community resources:  [place]  Patient/Family's Response to care:  Patient friend agreeable to discharge plan and care resumed at [facility] [employer]   Patient/Family's Understanding of and Emotional Response to Diagnosis, Current Treatment, and Prognosis:  Patient friend is involved in patient care and knowledgable of the patient medical history.   Emotional Assessment Appearance:  Appears stated age Attitude/Demeanor/Rapport:    Affect (typically observed):  Accepting, Pleasant Orientation:  Oriented to Self Alcohol / Substance use:  Not Applicable Psych involvement (Current and /or in the community):  No (Comment)  Discharge Needs  Concerns to be addressed:  Discharge Planning Concerns Readmission within the last 30 days:  No Current discharge risk:  Dependent with Mobility Barriers to Discharge:  Continued Medical Work up   [employer], LCSW 07/01/2017, 3:50 PM

## 2017-07-02 ENCOUNTER — Inpatient Hospital Stay (HOSPITAL_COMMUNITY): Payer: Medicare Other | Admitting: Anesthesiology

## 2017-07-02 ENCOUNTER — Inpatient Hospital Stay (HOSPITAL_COMMUNITY): Payer: Medicare Other

## 2017-07-02 ENCOUNTER — Encounter (HOSPITAL_COMMUNITY)
Admission: EM | Disposition: A | Discharge status: Skilled Nursing Facility | Payer: Self-pay | Source: Home / Self Care | Attending: Internal Medicine

## 2017-07-02 DIAGNOSIS — S72141A Displaced intertrochanteric fracture of right femur, initial encounter for closed fracture: Principal | ICD-10-CM

## 2017-07-02 DIAGNOSIS — R0600 Dyspnea, unspecified: Secondary | ICD-10-CM

## 2017-07-02 DIAGNOSIS — R918 Other nonspecific abnormal finding of lung field: Secondary | ICD-10-CM

## 2017-07-02 HISTORY — PX: FEMUR IM NAIL: SHX1597

## 2017-07-02 LAB — BASIC METABOLIC PANEL
ANION GAP: 9 (ref 5–15)
BUN: 29 mg/dL — ABNORMAL HIGH (ref 6–20)
CALCIUM: 8.7 mg/dL — AB (ref 8.9–10.3)
CO2: 26 mmol/L (ref 22–32)
Chloride: 105 mmol/L (ref 101–111)
Creatinine, Ser: 0.94 mg/dL (ref 0.44–1.00)
GFR calc Af Amer: 59 mL/min — ABNORMAL LOW (ref >60)
GFR calc non Af Amer: 51 mL/min — ABNORMAL LOW (ref >60)
Glucose, Bld: 136 mg/dL — ABNORMAL HIGH (ref 65–99)
POTASSIUM: 4.1 mmol/L (ref 3.5–5.1)
Sodium: 140 mmol/L (ref 135–145)

## 2017-07-02 LAB — CBC
HEMATOCRIT: 27.5 % — AB (ref 36.0–46.0)
HEMOGLOBIN: 9.1 g/dL — AB (ref 12.0–15.0)
MCH: 29.3 pg (ref 26.0–34.0)
MCHC: 33.1 g/dL (ref 30.0–36.0)
MCV: 88.4 fL (ref 78.0–100.0)
Platelets: 126 10*3/uL — ABNORMAL LOW (ref 150–400)
RBC: 3.11 MIL/uL — ABNORMAL LOW (ref 3.87–5.11)
RDW: 15.6 % — ABNORMAL HIGH (ref 11.5–15.5)
WBC: 10.4 10*3/uL (ref 4.0–10.5)

## 2017-07-02 LAB — ECHOCARDIOGRAM COMPLETE
Height: 57 in
Weight: 1616 oz

## 2017-07-02 SURGERY — INSERTION, INTRAMEDULLARY ROD, FEMUR
Anesthesia: General | Site: Hip | Laterality: Right

## 2017-07-02 MED ORDER — DEXAMETHASONE SODIUM PHOSPHATE 10 MG/ML IJ SOLN
INTRAMUSCULAR | Status: AC
  Filled 2017-07-02: qty 1

## 2017-07-02 MED ORDER — ONDANSETRON HCL 4 MG/2ML IJ SOLN
INTRAMUSCULAR | Status: DC | PRN
  Administered 2017-07-02: 4 mg via INTRAVENOUS

## 2017-07-02 MED ORDER — FENTANYL CITRATE (PF) 100 MCG/2ML IJ SOLN
INTRAMUSCULAR | Status: AC
  Filled 2017-07-02: qty 2

## 2017-07-02 MED ORDER — LACTATED RINGERS IV SOLN
INTRAVENOUS | Status: DC
  Administered 2017-07-02: 16:00:00 via INTRAVENOUS

## 2017-07-02 MED ORDER — ROCURONIUM BROMIDE 50 MG/5ML IV SOSY
PREFILLED_SYRINGE | INTRAVENOUS | Status: AC
  Filled 2017-07-02: qty 5

## 2017-07-02 MED ORDER — SODIUM CHLORIDE 0.9 % IV SOLN: INTRAVENOUS | Status: DC

## 2017-07-02 MED ORDER — PHENYLEPHRINE HCL 10 MG/ML IJ SOLN
INTRAVENOUS | Status: DC | PRN
  Administered 2017-07-02: 40 ug/min via INTRAVENOUS

## 2017-07-02 MED ORDER — FENTANYL CITRATE (PF) 100 MCG/2ML IJ SOLN
25.0000 ug | INTRAMUSCULAR | Status: DC | PRN
  Administered 2017-07-02: 50 ug via INTRAVENOUS

## 2017-07-02 MED ORDER — DEXAMETHASONE SODIUM PHOSPHATE 4 MG/ML IJ SOLN
INTRAMUSCULAR | Status: DC | PRN
  Administered 2017-07-02: 5 mg via INTRAVENOUS

## 2017-07-02 MED ORDER — FENTANYL CITRATE (PF) 100 MCG/2ML IJ SOLN
INTRAMUSCULAR | Status: DC | PRN
  Administered 2017-07-02: 25 ug via INTRAVENOUS
  Administered 2017-07-02: 75 ug via INTRAVENOUS

## 2017-07-02 MED ORDER — LIVING BETTER WITH HEART FAILURE BOOK
Freq: Once | Status: AC
  Administered 2017-07-02: 09:00:00

## 2017-07-02 MED ORDER — ESMOLOL HCL 100 MG/10ML IV SOLN
INTRAVENOUS | Status: AC
  Filled 2017-07-02: qty 10

## 2017-07-02 MED ORDER — ETOMIDATE 2 MG/ML IV SOLN
INTRAVENOUS | Status: AC
  Filled 2017-07-02: qty 10

## 2017-07-02 MED ORDER — NEOSTIGMINE METHYLSULFATE 5 MG/5ML IV SOSY
PREFILLED_SYRINGE | INTRAVENOUS | Status: AC
  Filled 2017-07-02: qty 10

## 2017-07-02 MED ORDER — ROCURONIUM BROMIDE 10 MG/ML (PF) SYRINGE
PREFILLED_SYRINGE | INTRAVENOUS | Status: DC | PRN
  Administered 2017-07-02: 30 mg via INTRAVENOUS

## 2017-07-02 MED ORDER — PROPOFOL 10 MG/ML IV BOLUS
INTRAVENOUS | Status: AC
  Filled 2017-07-02: qty 20

## 2017-07-02 MED ORDER — SUGAMMADEX SODIUM 200 MG/2ML IV SOLN
INTRAVENOUS | Status: DC | PRN
  Administered 2017-07-02: 100 mg via INTRAVENOUS

## 2017-07-02 MED ORDER — PHENYLEPHRINE HCL 10 MG/ML IJ SOLN
INTRAMUSCULAR | Status: AC
  Filled 2017-07-02: qty 1

## 2017-07-02 MED ORDER — CEFAZOLIN SODIUM-DEXTROSE 1-4 GM/50ML-% IV SOLN
1.0000 g | Freq: Four times a day (QID) | INTRAVENOUS | Status: AC
  Administered 2017-07-02 – 2017-07-03 (×2): 1 g via INTRAVENOUS
  Filled 2017-07-02 (×3): qty 50

## 2017-07-02 MED ORDER — ONDANSETRON HCL 4 MG/2ML IJ SOLN
INTRAMUSCULAR | Status: AC
  Filled 2017-07-02: qty 2

## 2017-07-02 MED ORDER — ETOMIDATE 2 MG/ML IV SOLN
INTRAVENOUS | Status: DC | PRN
  Administered 2017-07-02: 8 mg via INTRAVENOUS

## 2017-07-02 MED ORDER — ENOXAPARIN SODIUM 30 MG/0.3ML ~~LOC~~ SOLN
30.0000 mg | SUBCUTANEOUS | Status: DC
  Administered 2017-07-03 – 2017-07-04 (×2): 30 mg via SUBCUTANEOUS
  Filled 2017-07-02 (×3): qty 0.3

## 2017-07-02 MED ORDER — PROMETHAZINE HCL 25 MG/ML IJ SOLN: 6.2500 mg | INTRAMUSCULAR | Status: DC | PRN

## 2017-07-02 MED ORDER — ACETAMINOPHEN 325 MG PO TABS: 650.0000 mg | ORAL_TABLET | Freq: Four times a day (QID) | ORAL | Status: DC | PRN

## 2017-07-02 MED ORDER — ACETAMINOPHEN 650 MG RE SUPP
650.0000 mg | Freq: Four times a day (QID) | RECTAL | Status: DC | PRN
Start: 2017-07-02 — End: 2017-07-04

## 2017-07-02 SURGICAL SUPPLY — 41 items
BAG URINE DRAINAGE (UROLOGICAL SUPPLIES) ×3 IMPLANT
BAG ZIPLOCK 12X15 (MISCELLANEOUS) ×3 IMPLANT
BIT DRILL CANN LG 4.3MM (BIT) ×1 IMPLANT
BNDG GAUZE ELAST 4 BULKY (GAUZE/BANDAGES/DRESSINGS) ×3 IMPLANT
CLOSURE WOUND 1/2 X4 (GAUZE/BANDAGES/DRESSINGS) ×2
COVER PERINEAL POST (MISCELLANEOUS) ×3 IMPLANT
COVER SURGICAL LIGHT HANDLE (MISCELLANEOUS) ×3 IMPLANT
DRAPE INCISE IOBAN 66X45 STRL (DRAPES) ×3 IMPLANT
DRILL BIT CANN LG 4.3MM (BIT) ×3
DRSG MEPILEX BORDER 4X4 (GAUZE/BANDAGES/DRESSINGS) ×6 IMPLANT
DRSG MEPILEX BORDER 4X8 (GAUZE/BANDAGES/DRESSINGS) ×6 IMPLANT
DURAPREP 26ML APPLICATOR (WOUND CARE) ×3 IMPLANT
ELECT REM PT RETURN 15FT ADLT (MISCELLANEOUS) ×3 IMPLANT
FACESHIELD WRAPAROUND (MASK) ×6 IMPLANT
GLOVE BIO SURGEON STRL SZ7.5 (GLOVE) ×6 IMPLANT
GLOVE BIO SURGEON STRL SZ8 (GLOVE) ×6 IMPLANT
GLOVE BIOGEL PI IND STRL 8 (GLOVE) ×2 IMPLANT
GLOVE BIOGEL PI INDICATOR 8 (GLOVE) ×4
GOWN STRL REUS W/TWL LRG LVL3 (GOWN DISPOSABLE) ×3 IMPLANT
GOWN STRL REUS W/TWL XL LVL3 (GOWN DISPOSABLE) ×3 IMPLANT
GUIDEPIN 3.2X17.5 THRD DISP (PIN) ×3 IMPLANT
GUIDEWIRE BALL NOSE 80CM (WIRE) ×3 IMPLANT
KIT BASIN OR (CUSTOM PROCEDURE TRAY) ×3 IMPLANT
MANIFOLD NEPTUNE II (INSTRUMENTS) ×3 IMPLANT
NAIL HIP FRACT 130D 11X180 (Screw) ×3 IMPLANT
NS IRRIG 1000ML POUR BTL (IV SOLUTION) ×3 IMPLANT
PACK GENERAL/GYN (CUSTOM PROCEDURE TRAY) ×3 IMPLANT
POSITIONER SURGICAL ARM (MISCELLANEOUS) ×3 IMPLANT
SCREW BONE CORTICAL 5.0X32 (Screw) ×3 IMPLANT
SCREW LAG 6.5X80X10.5XLRG ST (Screw) ×1 IMPLANT
SCREW LAG 80MM (Screw) ×2 IMPLANT
STRIP CLOSURE SKIN 1/2X4 (GAUZE/BANDAGES/DRESSINGS) ×4 IMPLANT
SUT VIC AB 0 CT1 27 (SUTURE) ×2
SUT VIC AB 0 CT1 27XBRD ANTBC (SUTURE) ×1 IMPLANT
SUT VIC AB 1 CT1 27 (SUTURE) ×2
SUT VIC AB 1 CT1 27XBRD ANTBC (SUTURE) ×1 IMPLANT
SUT VIC AB 2-0 CT1 27 (SUTURE) ×2
SUT VIC AB 2-0 CT1 TAPERPNT 27 (SUTURE) ×1 IMPLANT
SUT VIC AB 4-0 PS2 18 (SUTURE) ×3 IMPLANT
TOWEL OR 17X26 10 PK STRL BLUE (TOWEL DISPOSABLE) ×3 IMPLANT
TRAY FOLEY W/METER SILVER 16FR (SET/KITS/TRAYS/PACK) IMPLANT

## 2017-07-02 NOTE — Anesthesia Postprocedure Evaluation (Signed)
Anesthesia Post Note  Patient: Tara Garrett  Procedure(s) Performed: INTRAMEDULLARY (IM) NAIL FEMORAL (Right Hip)     Patient location during evaluation: PACU Anesthesia Type: General Level of consciousness: awake and alert Pain management: pain level controlled Vital Signs Assessment: post-procedure vital signs reviewed and stable Respiratory status: spontaneous breathing, nonlabored ventilation, respiratory function stable and patient connected to nasal cannula oxygen Cardiovascular status: blood pressure returned to baseline and stable Postop Assessment: no apparent nausea or vomiting Anesthetic complications: no    Last Vitals:  Vitals:   07/02/17 1900 07/02/17 1915  BP: 125/69 124/80  Pulse: 85 79  Resp: (!) 23 11  Temp:  36.6 C  SpO2: 100% 100%    Last Pain:  Vitals:   07/02/17 1915  TempSrc:   PainSc: Asleep                 Juliza Machnik S

## 2017-07-02 NOTE — Transfer of Care (Signed)
Immediate Anesthesia Transfer of Care Note  Patient: Tara Garrett  Procedure(s) Performed: Procedure(s): INTRAMEDULLARY (IM) NAIL FEMORAL (Right)  Patient Location: PACU  Anesthesia Type:General  Level of Consciousness:  sedated, patient cooperative and responds to stimulation  Airway & Oxygen Therapy:Patient Spontanous Breathing and Patient connected to face mask oxgen  Post-op Assessment:  Report given to PACU RN and Post -op Vital signs reviewed and stable  Post vital signs:  Reviewed and stable  Last Vitals:  Vitals:   07/02/17 0615 07/02/17 1457  BP: (!) 142/60 (!) 113/43  Pulse: 65 66  Resp: 16 16  Temp: 36.9 C 36.7 C  SpO2: 47% 82%    Complications: No apparent anesthesia complications

## 2017-07-02 NOTE — Anesthesia Procedure Notes (Signed)
Procedure Name: Intubation Date/Time: 07/02/2017 4:56 PM Performed by: Anne Fu, CRNA Pre-anesthesia Checklist: Patient identified, Emergency Drugs available, Suction available, Patient being monitored and Timeout performed Patient Re-evaluated:Patient Re-evaluated prior to induction Oxygen Delivery Method: Circle system utilized Preoxygenation: Pre-oxygenation with 100% oxygen Induction Type: IV induction Ventilation: Mask ventilation without difficulty Laryngoscope Size: Mac and 3 Grade View: Grade I Tube type: Oral Tube size: 7.5 mm Number of attempts: 1 Airway Equipment and Method: Stylet Placement Confirmation: ETT inserted through vocal cords under direct vision,  positive ETCO2 and breath sounds checked- equal and bilateral Secured at: 17 cm Tube secured with: Tape Dental Injury: Teeth and Oropharynx as per pre-operative assessment

## 2017-07-02 NOTE — Progress Notes (Signed)
PROGRESS NOTE  Tara Garrett QQV:[id number] DOB: [birth year] DOA: 06/30/2017 PCP: [name], DO   LOS: 2 days   Brief Narrative / Interim history: Tara Garrett a 84 y.o.femalewithhistory of dementia, chronic systolic CHF, hypertension, anemia, bipolar disorder, hypothyroidism was brought to the ER after patient had an unwitnessed fall.  She was found to have a right hip fracture, orthopedics consulted.  Assessment & Plan: Principal Problem:   Closed right hip fracture, initial encounter [facility]) Active Problems:   Malignant neoplasm of female breast ([place])   Memory loss   Hypothyroidism (acquired)   Hypertension   Chronic combined systolic and diastolic CHF (congestive heart failure) (HCC)   Pulmonary infiltrates   Hip fracture (HCC)   Closed right hip fracture, initial encounter ([place]) -Following a mechanical fall, orthopedic surgery consulted, will take patient to the operating room later today -She is moderate risk for intermediate risk procedure, compensated from a congestive heart failure standpoint -2D echo pending -She will continue her Coreg perioperatively, hold Lasix for now  Chronic systolic and diastolic CHF -Last EF was was 15-20% in 2016, updated 2D echo pending -Clinically compensated, hold Lasix, continue Coreg and Cozaar -Caution with excessive fluid administration perioperatively, please discontinue all fluids once patient out of the operating room assuming blood pressure is stable  Chronic pulmonary infiltrates -Asymptomatic, followed by pulmonary Dr. Christinia Gully -Does not need any treatment/workup for this at this time  History of mild dementia and aggressive behavior -Continue home regimen of Depakote  Rest of her chronic medical problems remain stable   DVT prophylaxis: SCDs, per ortho Code Status: DNR Family Communication: caregiver bedside, no family present Disposition Plan: Likely needs SNF postop  Consultants:   Orthopedic  surgery  Procedures:   2D echo: Pending  Antimicrobials:  None    Subjective: - no chest pain, shortness of breath, no abdominal pain, nausea or vomiting.  Demented  Objective: Vitals:   07/01/17 1318 07/01/17 2207 07/02/17 0615 07/02/17 0900  BP: (!) 138/56 (!) 117/54 (!) 142/60   Pulse: 86 67 65   Resp: 16 15 16    Temp: 97.7 F (36.5 C) 98.3 F (36.8 C) 98.5 F (36.9 C)   TempSrc: Oral Oral Oral   SpO2: 96% 90% 92%   Weight:    45.4 kg (100 lb)  Height:        Intake/Output Summary (Last 24 hours) at 07/02/2017 1329 Last data filed at 07/02/2017 1007 Gross per 24 hour  Intake 1590 ml  Output 350 ml  Net 1240 ml   Filed Weights   07/01/17 1000 07/02/17 0900  Weight: 45.8 kg (101 lb) 45.4 kg (100 lb)    Examination:  Constitutional: NAD, pale appearing Eyes:  lids and conjunctivae normal ENMT: Mucous membranes are moist. Respiratory: clear to auscultation bilaterally, no wheezing, no crackles. Cardiovascular: Regular rate and rhythm, no murmurs / rubs / gallops. No LE edema.  Abdomen: no tenderness. Bowel sounds positive.  Musculoskeletal: Decreased muscle tone.  Skin: no rashes Neurologic: CN 2-12 grossly intact. Strength 5/5 in all 4.  Psychiatric: Underlying dementia   Data Reviewed: I have independently reviewed following labs and imaging studies   CBC: Recent Labs  Lab 06/30/17 2056 07/01/17 0550 07/02/17 0602  WBC 9.7 10.8* 10.4  NEUTROABS 8.3*  --   --   HGB 11.4* 9.7* 9.1*  HCT 34.4* 29.1* 27.5*  MCV 88.4 87.7 88.4  PLT 154 140* 332*   Basic Metabolic Panel: Recent Labs  Lab 06/30/17 2056  07/01/17 0550 07/02/17 0602  NA 139 139 140  K 4.6 4.2 4.1  CL 102 104 105  CO2 26 26 26   GLUCOSE 132* 162* 136*  BUN 34* 32* 29*  CREATININE 1.06* 1.02* 0.94  CALCIUM 9.2 8.7* 8.7*   GFR: Estimated Creatinine Clearance: 23.3 mL/min (by C-G formula based on SCr of 0.94 mg/dL). Liver Function Tests: No results for input(s): AST, ALT,  ALKPHOS, BILITOT, PROT, ALBUMIN in the last 168 hours. No results for input(s): LIPASE, AMYLASE in the last 168 hours. No results for input(s): AMMONIA in the last 168 hours. Coagulation Profile: Recent Labs  Lab 06/30/17 2056  INR 1.23   Cardiac Enzymes: No results for input(s): CKTOTAL, CKMB, CKMBINDEX, TROPONINI in the last 168 hours. BNP (last 3 results) No results for input(s): PROBNP in the last 8760 hours. HbA1C: No results for input(s): HGBA1C in the last 72 hours. CBG: No results for input(s): GLUCAP in the last 168 hours. Lipid Profile: No results for input(s): CHOL, HDL, LDLCALC, TRIG, CHOLHDL, LDLDIRECT in the last 72 hours. Thyroid Function Tests: No results for input(s): TSH, T4TOTAL, FREET4, T3FREE, THYROIDAB in the last 72 hours. Anemia Panel: No results for input(s): VITAMINB12, FOLATE, FERRITIN, TIBC, IRON, RETICCTPCT in the last 72 hours. Urine analysis: No results found for: COLORURINE, APPEARANCEUR, LABSPEC, PHURINE, GLUCOSEU, HGBUR, BILIRUBINUR, KETONESUR, PROTEINUR, UROBILINOGEN, NITRITE, LEUKOCYTESUR Sepsis Labs: Invalid input(s): PROCALCITONIN, LACTICIDVEN  Recent Results (from the past 240 hour(s))  Surgical PCR screen     Status: Abnormal   Collection Time: 07/01/17  2:30 AM  Result Value Ref Range Status   MRSA, PCR NEGATIVE NEGATIVE Final   Staphylococcus aureus POSITIVE (A) NEGATIVE Final    Comment: (NOTE) The Xpert SA Assay (FDA approved for NASAL specimens in patients 57 years of age and older), is one component of a comprehensive surveillance program. It is not intended to diagnose infection nor to guide or monitor treatment.       Radiology Studies: Dg Chest 2 View  Result Date: 06/30/2017 CLINICAL DATA:  Fall with hip pain, history of breast cancer EXAM: CHEST  2 VIEW COMPARISON:  12/14/2014, 08/24/2013 FINDINGS: Ground-glass opacity and consolidation in the right upper lobe. No pleural effusion. Cardiomegaly with aortic  atherosclerosis. No pneumothorax. Severe chronic deformity of the right shoulder. Multiple old right-sided rib fractures IMPRESSION: 1. Ground-glass opacity and consolidation in the right upper lobe, possibly a pneumonia however imaging follow-up to resolution recommended to exclude underlying mass 2. Moderate severe cardiomegaly without edema Electronically Signed   By: Donavan Foil M.D.   On: 06/30/2017 20:43   Ct Head Wo Contrast  Result Date: 06/30/2017 CLINICAL DATA:  Head trauma unwitnessed fall EXAM: CT HEAD WITHOUT CONTRAST CT CERVICAL SPINE WITHOUT CONTRAST TECHNIQUE: Multidetector CT imaging of the head and cervical spine was performed following the standard protocol without intravenous contrast. Multiplanar CT image reconstructions of the cervical spine were also generated. COMPARISON:  None. FINDINGS: CT HEAD FINDINGS Brain: No acute territorial infarction, hemorrhage, or intracranial mass is visualized. Moderate-to-marked atrophy. Mild to moderate subcortical and periventricular white matter hypodensity consistent with small vessel ischemic changes. Prominent ventricles, felt related to atrophy Vascular: No hyperdense vessels.  Carotid artery calcifications. Skull: No fracture or suspicious lesion Sinuses/Orbits: Mild mucosal thickening in the ethmoid and maxillary sinuses. No acute orbital abnormality. Other: None CT CERVICAL SPINE FINDINGS Alignment: No subluxation.  Facet alignment is within normal limits. Skull base and vertebrae: No acute fracture. No primary bone lesion or focal pathologic process. Soft  tissues and spinal canal: No prevertebral fluid or swelling. No visible canal hematoma. Disc levels: Moderate severe diffuse degenerative changes C3 through C7. Multiple level bilateral facet arthropathy with multilevel bilateral foraminal stenosis. Upper chest: Partially visualized ground-glass density in the right upper lobe. Mild apical emphysema. Aortic atherosclerosis. No thyroid mass  Other: None IMPRESSION: 1. No CT evidence for acute intracranial abnormality. Atrophy and small vessel ischemic changes of the white matter 2. Degenerative changes of the cervical spine. No acute osseous abnormality 3. Mild apical emphysema. Partially visualized ground-glass density in the right upper lobe suspicious for pneumonia Electronically Signed   By: Donavan Foil M.D.   On: 06/30/2017 20:54   Ct Cervical Spine Wo Contrast  Result Date: 06/30/2017 CLINICAL DATA:  Head trauma unwitnessed fall EXAM: CT HEAD WITHOUT CONTRAST CT CERVICAL SPINE WITHOUT CONTRAST TECHNIQUE: Multidetector CT imaging of the head and cervical spine was performed following the standard protocol without intravenous contrast. Multiplanar CT image reconstructions of the cervical spine were also generated. COMPARISON:  None. FINDINGS: CT HEAD FINDINGS Brain: No acute territorial infarction, hemorrhage, or intracranial mass is visualized. Moderate-to-marked atrophy. Mild to moderate subcortical and periventricular white matter hypodensity consistent with small vessel ischemic changes. Prominent ventricles, felt related to atrophy Vascular: No hyperdense vessels.  Carotid artery calcifications. Skull: No fracture or suspicious lesion Sinuses/Orbits: Mild mucosal thickening in the ethmoid and maxillary sinuses. No acute orbital abnormality. Other: None CT CERVICAL SPINE FINDINGS Alignment: No subluxation.  Facet alignment is within normal limits. Skull base and vertebrae: No acute fracture. No primary bone lesion or focal pathologic process. Soft tissues and spinal canal: No prevertebral fluid or swelling. No visible canal hematoma. Disc levels: Moderate severe diffuse degenerative changes C3 through C7. Multiple level bilateral facet arthropathy with multilevel bilateral foraminal stenosis. Upper chest: Partially visualized ground-glass density in the right upper lobe. Mild apical emphysema. Aortic atherosclerosis. No thyroid mass Other:  None IMPRESSION: 1. No CT evidence for acute intracranial abnormality. Atrophy and small vessel ischemic changes of the white matter 2. Degenerative changes of the cervical spine. No acute osseous abnormality 3. Mild apical emphysema. Partially visualized ground-glass density in the right upper lobe suspicious for pneumonia Electronically Signed   By: Donavan Foil M.D.   On: 06/30/2017 20:54   Dg Hip Unilat With Pelvis 2-3 Views Right  Result Date: 06/30/2017 CLINICAL DATA:  Right hip pain due to a fall today. Initial encounter. EXAM: DG HIP (WITH OR WITHOUT PELVIS) 2-3V RIGHT COMPARISON:  None. FINDINGS: The patient has an acute right intertrochanteric fracture. No other acute bony or joint abnormality is seen. IMPRESSION: Acute right intertrochanteric fracture. Electronically Signed   By: Inge Rise M.D.   On: 06/30/2017 20:41     Scheduled Meds: . carvedilol  3.125 mg Oral BID WC  . Chlorhexidine Gluconate Cloth  6 each Topical Daily  . divalproex  250 mg Oral BID  . levothyroxine  25 mcg Oral QAC breakfast  . losartan  25 mg Oral Daily  . mupirocin ointment  1 application Nasal BID  . sertraline  100 mg Oral Daily   Continuous Infusions: .  ceFAZolin (ANCEF) IV       Marzetta Board, MD, PhD [facility] Pager [phone]  If 7PM-7AM, please contact night-coverage [email or web address] Password [facility] 07/02/2017, 1:29 PM

## 2017-07-02 NOTE — Progress Notes (Signed)
  Echocardiogram 2D Echocardiogram has been performed.  Celso Granja L Androw 07/02/2017, 9:07 AM

## 2017-07-02 NOTE — Op Note (Signed)
  OPERATIVE REPORT   PREOPERATIVE DIAGNOSIS: Right intertrochanteric femur fracture.   POSTOP DIAGNOSIS: Right intertrochanteric femur fracture.   PROCEDURE: Intramedullary nailing, Right intertrochanteric femur  fracture.   SURGEON: Gaynelle Arabian, M.D.   ASSISTANT: Arlee Muslim, PA-C  ANESTHESIA:General  Estimated BLOOD LOSS: 100 ml  DRAINS: None.   COMPLICATIONS:   None  CONDITION: -PACU - hemodynamically stable.    CLINICAL NOTE: Tara Garrett is an 84 y.o. female, who had a fall 2  days ago sustaining a displaced  Right intertrochanteric femur fracture. They have been cleared medically and present for operative fixation   PROCEDURE IN DETAIL: After successful administration of  General,  the patient was placed on the fracture table with Right lower extremity in a well-padded traction boot,  Left lower extremity in a well-padded leg holder. Under fluoroscopic guidance, the fracture was reduced. The traction was locked in this position. Thigh was prepped  and draped in the usual sterile fashion. The guide pin for the Biomet  Affixus was then passed percutaneously to the tip of the greater  trochanter, and then entered into the femoral canal. It was passed into the  canal. The small incision was made and the starter reamer passed over  the guide pin. This was then removed. The nail which was an 11  mm  diameter short trochanteric nail with 130 degrees angle was attached to  the external guide and then passed into the femoral canal, impacted to  the appropriate depth in the canal, then we used the external guide to  place the lag screw. We had to ream the canal to 12.5 mm over a guide wire prior to passing the nail. Through the external guide, a guide pin was  passed. Small incision made, and the guide pin was in the center of the  femoral head on the AP and slightly center to posterior on the lateral.  Length was 80 mm. Triple reamer was passed over the guide pin. 80 mm  lag  screw was placed. It was then locked down with a locking screw.  Through the external guide, the distal interlock was placed through the  static hole and this was 32 mm in length with excellent bicortical  purchase. The external guide was then removed. Hardware was in good  position and fracture was well reduced. Wound was copiously irrigated with saline  solution, and  closed deep with interrupted 1 Vicryl, subcu  interrupted 2-0 Vicryl, subcuticular running 4-0 Monocryl. Incision was  cleaned and dried and sterile dressings applied. The patient was awakened and  transported to recovery in stable condition.   Tara Plover Zayd Bonet, MD    07/02/2017, 5:52 PM

## 2017-07-02 NOTE — Anesthesia Preprocedure Evaluation (Signed)
Anesthesia Evaluation  Patient identified by MRN, date of birth, ID band Patient awake    Reviewed: Allergy & Precautions, NPO status , Patient's Chart, lab work & pertinent test results  Airway Mallampati: II  TM Distance: >3 FB Neck ROM: Full    Dental no notable dental hx.    Pulmonary former smoker,  Ground-glass opacity and consolidation in the right upper lobe, possibly a pneumonia however imaging follow-up to resolution recommended to exclude underlying mass 2. Moderate severe cardiomegaly without edema   Pulmonary exam normal breath sounds clear to auscultation       Cardiovascular hypertension, +CHF  Normal cardiovascular exam Rhythm:Regular Rate:Normal  Left ventricle: The cavity size was normal. Wall thickness was   normal. Systolic function was severely reduced. The estimated   ejection fraction was in the range of 20% to 25%. There is   akinesis of the anteroseptal, anterolateral, and apical   myocardium with mild basilar sparing of contractile performance.   Doppler parameters are consistent with abnormal left ventricular   relaxation (grade 1 diastolic dysfunction). - Aortic valve: Valve mobility was restricted. Transvalvular   velocity was within the normal range. There was no stenosis. - Mitral valve: Moderately calcified annulus. Moderately thickened   leaflets . There was mild regurgitation. - Left atrium: The atrium was severely dilated. Volume/bsa, ES,   (1-plane Simpson&'s, A2C): 73.3 ml/m^2. - Tricuspid valve: There was trivial regurgitation.    Neuro/Psych negative neurological ROS  negative psych ROS   GI/Hepatic negative GI ROS, Neg liver ROS,   Endo/Other  Hypothyroidism   Renal/GU negative Renal ROS  negative genitourinary   Musculoskeletal negative musculoskeletal ROS (+)   Abdominal   Peds negative pediatric ROS (+)  Hematology negative hematology ROS (+)   Anesthesia Other  Findings   Reproductive/Obstetrics negative OB ROS                             Anesthesia Physical Anesthesia Plan  ASA: IV  Anesthesia Plan: General   Post-op Pain Management:    Induction: Intravenous  PONV Risk Score and Plan: 2 and Ondansetron and Dexamethasone  Airway Management Planned: Oral ETT  Additional Equipment:   Intra-op Plan:   Post-operative Plan: Extubation in OR  Informed Consent: I have reviewed the patients History and Physical, chart, labs and discussed the procedure including the risks, benefits and alternatives for the proposed anesthesia with the patient or authorized representative who has indicated his/her understanding and acceptance.   Dental advisory given  Plan Discussed with: CRNA and Surgeon  Anesthesia Plan Comments:         Anesthesia Quick Evaluation

## 2017-07-03 ENCOUNTER — Encounter (HOSPITAL_COMMUNITY): Payer: Self-pay | Admitting: Orthopedic Surgery

## 2017-07-03 LAB — CBC
HCT: 23.3 % — ABNORMAL LOW (ref 36.0–46.0)
Hemoglobin: 7.9 g/dL — ABNORMAL LOW (ref 12.0–15.0)
MCH: 30.2 pg (ref 26.0–34.0)
MCHC: 33.9 g/dL (ref 30.0–36.0)
MCV: 88.9 fL (ref 78.0–100.0)
PLATELETS: 128 10*3/uL — AB (ref 150–400)
RBC: 2.62 MIL/uL — ABNORMAL LOW (ref 3.87–5.11)
RDW: 15.6 % — AB (ref 11.5–15.5)
WBC: 10.7 10*3/uL — AB (ref 4.0–10.5)

## 2017-07-03 LAB — TROPONIN I
TROPONIN I: 0.04 ng/mL — AB (ref <0.03)
TROPONIN I: 0.04 ng/mL — AB (ref <0.03)
Troponin I: 0.04 ng/mL (ref <0.03)

## 2017-07-03 LAB — BASIC METABOLIC PANEL
Anion gap: 8 (ref 5–15)
BUN: 30 mg/dL — AB (ref 6–20)
CO2: 28 mmol/L (ref 22–32)
CREATININE: 0.99 mg/dL (ref 0.44–1.00)
Calcium: 8.2 mg/dL — ABNORMAL LOW (ref 8.9–10.3)
Chloride: 104 mmol/L (ref 101–111)
GFR, EST AFRICAN AMERICAN: 56 mL/min — AB (ref >60)
GFR, EST NON AFRICAN AMERICAN: 48 mL/min — AB (ref >60)
Glucose, Bld: 123 mg/dL — ABNORMAL HIGH (ref 65–99)
Potassium: 4.2 mmol/L (ref 3.5–5.1)
Sodium: 140 mmol/L (ref 135–145)

## 2017-07-03 LAB — PREPARE RBC (CROSSMATCH)

## 2017-07-03 MED ORDER — SODIUM CHLORIDE 0.9 % IV SOLN
Freq: Once | INTRAVENOUS | Status: AC
  Administered 2017-07-03: 10:00:00 via INTRAVENOUS

## 2017-07-03 NOTE — Progress Notes (Signed)
Subjective: 1 Day Post-Op Procedure(s) (LRB): INTRAMEDULLARY (IM) NAIL FEMORAL (Right) Patient reports pain as mild.   Patient seen in rounds for Dr. Wynelle Link. Patient is well, but has had some minor complaints of pain in the hip, requiring pain medications Patient is pleasantly confused but will answer questions about hip. As per notes, had chest pain earlier this morning. Being followed by Medical Service Plan is to go Skilled nursing facility after hospital stay.  Objective: Vital signs in last 24 hours: Temp:  [97.8 F (36.6 C)-98.4 F (36.9 C)] 97.9 F (36.6 C) (01/24 0623) Pulse Rate:  [66-89] 89 (01/24 0814) Resp:  [11-23] 16 (01/24 0623) BP: (96-145)/(43-80) 100/68 (01/24 0814) SpO2:  [91 %-100 %] 94 % (01/24 0623) FiO2 (%):  [28 %] 28 % (01/23 1935) Weight:  [60.8 kg (134 lb)] 60.8 kg (134 lb) (01/24 0623)  Intake/Output from previous day: 01/23 0701 - 01/24 0700 In: 1320 [P.O.:570; I.V.:600; IV Piggyback:150] Out: 670 [Urine:650; Blood:20] Intake/Output this shift: No intake/output data recorded.  Recent Labs    06/30/17 2056 07/01/17 0550 07/02/17 0602 07/03/17 0527  HGB 11.4* 9.7* 9.1* 7.9*   Recent Labs    07/02/17 0602 07/03/17 0527  WBC 10.4 10.7*  RBC 3.11* 2.62*  HCT 27.5* 23.3*  PLT 126* 128*   Recent Labs    07/02/17 0602 07/03/17 0527  NA 140 140  K 4.1 4.2  CL 105 104  CO2 26 28  BUN 29* 30*  CREATININE 0.94 0.99  GLUCOSE 136* 123*  CALCIUM 8.7* 8.2*   Recent Labs    06/30/17 2056  INR 1.23    EXAM General - Patient is Alert and and pleasently confused at times of rounds. Extremity - Neurovascular intact Sensation intact distally Dressing - dressing C/D/I, Motor Function - intact, moving foot and toes well on exam.   Past Medical History:  Diagnosis Date  . Closed fracture of two ribs 10/21/2011, 08/24/2013  . Closed fracture of unspecified part of upper end of humerus 01/13/2005  . Fall 10-09-2011   fell outdoors on  driveway fell and hit right side of body and face  . Female stress incontinence 02/10/2004  . Gait disorder 08/25/2013   Poor balance   . Herpes zoster without mention of complication 08/65/7846  . Hyperglycemia   . Hyperlipidemia   . Hypertension   . Hypothyroidism   . Keratosis seborrheica   . Left knee pain   . Leg pain   . Macular degeneration   . Malignant neoplasm of breast (female), unspecified site   . Multiple fractures of ribs of left side 08/24/2013  . Neoplasm of breast    right breast cancer  . Open wound of knee, leg (except thigh), and ankle, without mention of complication 9/62/9528  . Osteoporosis   . Other malaise and fatigue 01/06/2012  . Other specified circulatory system disorders 10/09/2011  . Pain in joint, pelvic region and thigh 05/11/2012  . Pain in joint, site unspecified 02/03/39  . Phlebitis and thrombophlebitis of other deep vessels of lower extremities 01/06/2012  . Right shoulder injury 12/2009  . Shoulder pain, right 10/2009   X- rayed at Dr. Berenice Primas.Severe Degenerative Disease. Recommended an artificial shoulder. Saw Dr. Tamera Punt .  Marland Kitchen Varicose vein 2014  . Varicose veins   . Vitamin D deficiency     Assessment/Plan: 1 Day Post-Op Procedure(s) (LRB): INTRAMEDULLARY (IM) NAIL FEMORAL (Right) Principal Problem:   Closed right hip fracture, initial encounter [facility]) Active Problems:   Malignant  neoplasm of female breast ([place])   Memory loss   Hypothyroidism (acquired)   Hypertension   Chronic combined systolic and diastolic CHF (congestive heart failure) ([place])   Pulmonary infiltrates   Hip fracture (HCC)  Estimated body mass index is 29 kg/m as calculated from the following:   Height as of this encounter: 4\' 9"  (1.448 m).   Weight as of this encounter: 60.8 kg (134 lb).  Getting cardiac labs  DVT Prophylaxis - Lovenox Weight-Bearing as tolerated to right leg D/C O2 and Pulse OX and try on Room Air  Arlee Muslim, PA-C Orthopaedic  Surgery 07/03/2017, 9:44 AM

## 2017-07-03 NOTE — Plan of Care (Signed)
Patient IV patent. RN assessment and VS revealed stability for transfer to 4E - RM 1421. Report called and s/w Preston Fleeting, RN.  1 unit blood completed, paperwork completed, signed off and placed in chart/audit book.  Patient transferring to be placed on telemetry d/t complaints of chest pain. Caregivers and [facility] (POA) notified of room change.

## 2017-07-03 NOTE — Progress Notes (Signed)
Plan for d/c to SNF, discharge planning per CSW. [phone] 

## 2017-07-03 NOTE — Progress Notes (Signed)
PROGRESS NOTE  Tara Tara Garrett GEX:[id number] DOB: [birth year] DOA: 06/30/2017 PCP: [name], DO   LOS: 3 days   Brief Narrative / Interim history: Tara Tara Garrett a 84 y.o.femalewithhistory of dementia, chronic systolic CHF, hypertension, anemia, bipolar disorder, hypothyroidism was brought to the ER after patient had an unwitnessed fall.  She was found to have a right hip fracture, orthopedics consulted.  Assessment & Plan: Principal Problem:   Closed right hip fracture, initial encounter [facility]) Active Problems:   Malignant neoplasm of female breast ([place])   Memory loss   Hypothyroidism (acquired)   Hypertension   Chronic combined systolic and diastolic CHF (congestive heart failure) ([place])   Pulmonary infiltrates   Hip fracture (HCC)   Closed right hip fracture (HCC) -Following a mechanical fall, orthopedic surgery consulted, patient is status post right intramedullary nailing -2D echo done on 1/23 with EF of 20-25% -She will continue her Coreg perioperatively, hold Lasix for now  Chronic systolic and diastolic CHF -Last EF was was 15-20% in 2016, updated 2D echo on 1/23 with EF of 20-25% -Clinically compensated, hold Lasix -Slightly hypotensive this morning, hold Cozaar, hold Coreg -Transfuse 1 unit of packed red blood cells  Acute blood loss anemia -Postop, hemoglobin 7.9, transfuse 1 unit of packed red blood cells  Chest pain -Patient reported to the night RN that she had an episode of chest pain.  Chest pain-free on my evaluation, EKG without significant acute findings.  We will cycle cardiac enzymes x3, monitor on telemetry  Chronic pulmonary infiltrates -Asymptomatic, followed by pulmonary Dr. Christinia Gully -Does not need any treatment/workup for this at this time  History of mild dementia and aggressive behavior -Continue home regimen of Depakote    DVT prophylaxis: SCDs, per ortho Code Status: DNR Family Communication: caregiver bedside, no  family present Disposition Plan: Likely needs SNF postop  Consultants:   Orthopedic surgery  Procedures:   2D echo Study Conclusions - Left ventricle: The cavity size was normal. Wall thickness was normal. Systolic function was severely reduced. The estimated ejection fraction was in the range of 20% to 25%. There is akinesis of the anteroseptal, anterolateral, and apical myocardium with mild basilar sparing of contractile performance. Doppler parameters are consistent with abnormal left ventricular relaxation (grade 1 diastolic dysfunction). - Aortic valve: Valve mobility was restricted. Transvalvular velocity was within the normal range. There was no stenosis. - Mitral valve: Moderately calcified annulus. Moderately thickened leaflets . There was mild regurgitation. - Left atrium: The atrium was severely dilated. Volume/bsa, ES, (1-plane Simpson&'s, A2C): 73.3 ml/m^2. - Tricuspid valve: There was trivial regurgitation.  Impressions:  - Compared to the prior study, there has been no significant interval change.  Antimicrobials:  None    Subjective: - no chest pain, shortness of breath, no abdominal pain, nausea or vomiting.   Objective: Vitals:   07/03/17 0623 07/03/17 0814 07/03/17 0951 07/03/17 0952  BP: (!) 100/51 100/68 (!) 86/43 (!) 90/40  Pulse: 71 89 96 91  Resp: 16  17   Temp: 97.9 F (36.6 C)  98.6 F (37 C)   TempSrc: Oral  Oral   SpO2: 94%  92%   Weight: 60.8 kg (134 lb)     Height:        Intake/Output Summary (Last 24 hours) at 07/03/2017 1029 Last data filed at 07/03/2017 0400 Gross per 24 hour  Intake 1050 ml  Output 670 ml  Net 380 ml   Filed Weights   07/01/17 1000 07/02/17 0900  07/03/17 0623  Weight: 45.8 kg (101 lb) 45.4 kg (100 lb) 60.8 kg (134 lb)    Examination:  Constitutional: No acute distress Eyes: No scleral icterus, lids and conjunctivae normal ENMT: mmm Respiratory: Clear bilaterally, no wheezing. Cardiovascular: Regular rate  and rhythm, 3/6 SEM, no edema Abdomen: Soft, nontender, nondistended.  Positive bowel sounds Musculoskeletal: Overall decreased muscle tone Skin: No rashes, dressing on right hip intact without any bleeding Neurologic: Nonfocal   Data Reviewed: I have independently reviewed following labs and imaging studies   CBC: Recent Labs  Lab 06/30/17 2056 07/01/17 0550 07/02/17 0602 07/03/17 0527  WBC 9.7 10.8* 10.4 10.7*  NEUTROABS 8.3*  --   --   --   HGB 11.4* 9.7* 9.1* 7.9*  HCT 34.4* 29.1* 27.5* 23.3*  MCV 88.4 87.7 88.4 88.9  PLT 154 140* 126* 540*   Basic Metabolic Panel: Recent Labs  Lab 06/30/17 2056 07/01/17 0550 07/02/17 0602 07/03/17 0527  NA 139 139 140 140  K 4.6 4.2 4.1 4.2  CL 102 104 105 104  CO2 26 26 26 28   GLUCOSE 132* 162* 136* 123*  BUN 34* 32* 29* 30*  CREATININE 1.06* 1.02* 0.94 0.99  CALCIUM 9.2 8.7* 8.7* 8.2*   GFR: Estimated Creatinine Clearance: 27.2 mL/min (by C-G formula based on SCr of 0.99 mg/dL). Liver Function Tests: No results for input(s): AST, ALT, ALKPHOS, BILITOT, PROT, ALBUMIN in the last 168 hours. No results for input(s): LIPASE, AMYLASE in the last 168 hours. No results for input(s): AMMONIA in the last 168 hours. Coagulation Profile: Recent Labs  Lab 06/30/17 2056  INR 1.23   Cardiac Enzymes: Recent Labs  Lab 07/03/17 0822  TROPONINI 0.04*   BNP (last 3 results) No results for input(s): PROBNP in the last 8760 hours. HbA1C: No results for input(s): HGBA1C in the last 72 hours. CBG: No results for input(s): GLUCAP in the last 168 hours. Lipid Profile: No results for input(s): CHOL, HDL, LDLCALC, TRIG, CHOLHDL, LDLDIRECT in the last 72 hours. Thyroid Function Tests: No results for input(s): TSH, T4TOTAL, FREET4, T3FREE, THYROIDAB in the last 72 hours. Anemia Panel: No results for input(s): VITAMINB12, FOLATE, FERRITIN, TIBC, IRON, RETICCTPCT in the last 72 hours. Urine analysis: No results found for: COLORURINE,  APPEARANCEUR, LABSPEC, PHURINE, GLUCOSEU, HGBUR, BILIRUBINUR, KETONESUR, PROTEINUR, UROBILINOGEN, NITRITE, LEUKOCYTESUR Sepsis Labs: Invalid input(s): PROCALCITONIN, LACTICIDVEN  Recent Results (from the past 240 hour(s))  Surgical PCR screen     Status: Abnormal   Collection Time: 07/01/17  2:30 AM  Result Value Ref Range Status   MRSA, PCR NEGATIVE NEGATIVE Final   Staphylococcus aureus POSITIVE (A) NEGATIVE Final    Comment: (NOTE) The Xpert SA Assay (FDA approved for NASAL specimens in patients 7 years of age and older), is one component of a comprehensive surveillance program. It is not intended to diagnose infection nor to guide or monitor treatment.       Radiology Studies: Dg C-arm 1-60 Min-no Report  Result Date: 07/02/2017 Fluoroscopy was utilized by the requesting physician.  No radiographic interpretation.   Dg Femur [place], [place] 2 Views Right  Result Date: 07/02/2017 CLINICAL DATA:  84 year old female with a history of surgical fixation EXAM: RIGHT FEMUR PORTABLE 2 VIEW; DG C-ARM 1-60 MIN-NO REPORT COMPARISON:  06/30/2017 FINDINGS: Limited intraoperative fluoroscopic spot images of the right hip demonstrate anterior intramedullary rod placement with single proximal cannulated screw at the femoral neck. Single distal interlocking screw. No immediate complicating features. IMPRESSION: Limited intraoperative fluoroscopic spot images of  right hip fracture fixation, as above. Please refer to the dictated operative report for full details of intraoperative findings and procedure. Electronically Signed   By: Corrie Mckusick D.O.   On: 07/02/2017 20:05     Scheduled Meds: . carvedilol  3.125 mg Oral BID WC  . Chlorhexidine Gluconate Cloth  6 each Topical Daily  . divalproex  250 mg Oral BID  . enoxaparin (LOVENOX) injection  30 mg Subcutaneous Q24H  . levothyroxine  25 mcg Oral QAC breakfast  . losartan  25 mg Oral Daily  . mupirocin ointment  1 application Nasal BID  .  sertraline  100 mg Oral Daily   Continuous Infusions: . sodium chloride       Marzetta Board, MD, PhD [facility] Pager [phone]  If 7PM-7AM, please contact night-coverage [email or web address] Password [facility] 07/03/2017, 10:29 AM

## 2017-07-03 NOTE — Progress Notes (Signed)
OT Cancellation Note  Patient Details Name: Tara Garrett MRN: [id number] DOB: [birth year]   Cancelled Treatment:    Reason Eval/Treat Not Completed: Medical issues which prohibited therapy. Per RN, pt will get 1 unit PRBCs and will move to tele.  Will check back, likely tomorrow  Shea Swalley 07/03/2017, 9:46 AM   Lesle Chris, OTR/L [phone] 07/03/2017

## 2017-07-03 NOTE — Progress Notes (Addendum)
Nurse received call from lab. Patient has critical troponin of 0.04. Nurse paged Dr. Cruzita Lederer to make provider aware of critical troponin level. Nurse is awaiting provider to return call.   Dr. Cruzita Lederer returned call to nurse and is aware of critical troponin at 0.04. No new orders at this time.

## 2017-07-03 NOTE — Progress Notes (Signed)
Nurse paged Dr. Cruzita Lederer to make provider aware of patients blood pressure 100/68, pulse 89 and to verify if patient should receive carvedilol. Per Dr. Cruzita Lederer, hold carvedilol.

## 2017-07-03 NOTE — Progress Notes (Signed)
PT Cancellation Note  Patient Details Name: Tara Garrett MRN: [id number] DOB: [birth year]   Cancelled Treatment:    Reason Eval/Treat Not Completed: Medical issues which prohibited therapy(getting blood this morning then just transferred to telemetry unit; will check back as schedule permits.)   Epifania Littrell,KATHrine E 07/03/2017, 2:17 PM Carmelia Bake, PT, DPT 07/03/2017 Pager: [phone]

## 2017-07-03 NOTE — Progress Notes (Signed)
Pt complaining of chest pain this AM. Vital signs stable K Baltazar Najjar - [facility] floor coverage paged. EKG ordered STAT.

## 2017-07-03 NOTE — Progress Notes (Signed)
RN talked with Dr. Cruzita Lederer about status of foley catheter. Per Dr. Cruzita Lederer, he would like PT to work with patient more and up and moving before removing the foley catheter. Patient still has 80F foley catheter for the time being.  Carmela Hurt, RN

## 2017-07-03 NOTE — Progress Notes (Signed)
Nurse paged Dr. Cruzita Lederer to make provider aware of manual blood pressure 90/40, pulse 91. Per Dr. Cruzita Lederer, proceed with blood.

## 2017-07-04 DIAGNOSIS — R413 Other amnesia: Secondary | ICD-10-CM

## 2017-07-04 LAB — TYPE AND SCREEN
ABO/RH(D): A POS
ANTIBODY SCREEN: NEGATIVE
UNIT DIVISION: 0

## 2017-07-04 LAB — CBC
HEMATOCRIT: 26.5 % — AB (ref 36.0–46.0)
Hemoglobin: 9.1 g/dL — ABNORMAL LOW (ref 12.0–15.0)
MCH: 30.5 pg (ref 26.0–34.0)
MCHC: 34.3 g/dL (ref 30.0–36.0)
MCV: 88.9 fL (ref 78.0–100.0)
Platelets: 116 10*3/uL — ABNORMAL LOW (ref 150–400)
RBC: 2.98 MIL/uL — AB (ref 3.87–5.11)
RDW: 15.1 % (ref 11.5–15.5)
WBC: 8.7 10*3/uL (ref 4.0–10.5)

## 2017-07-04 LAB — BASIC METABOLIC PANEL
Anion gap: 7 (ref 5–15)
BUN: 31 mg/dL — ABNORMAL HIGH (ref 6–20)
CO2: 28 mmol/L (ref 22–32)
Calcium: 8.2 mg/dL — ABNORMAL LOW (ref 8.9–10.3)
Chloride: 103 mmol/L (ref 101–111)
Creatinine, Ser: 0.93 mg/dL (ref 0.44–1.00)
GFR calc Af Amer: 60 mL/min — ABNORMAL LOW (ref >60)
GFR calc non Af Amer: 52 mL/min — ABNORMAL LOW (ref >60)
GLUCOSE: 102 mg/dL — AB (ref 65–99)
POTASSIUM: 3.8 mmol/L (ref 3.5–5.1)
Sodium: 138 mmol/L (ref 135–145)

## 2017-07-04 LAB — BPAM RBC
Blood Product Expiration Date: 201902072359
ISSUE DATE / TIME: 201901241002
Unit Type and Rh: 6200

## 2017-07-04 MED ORDER — HYDROCODONE-ACETAMINOPHEN 5-325 MG PO TABS: 1.0000 | ORAL_TABLET | Freq: Four times a day (QID) | ORAL | 0 refills | Status: AC | PRN

## 2017-07-04 MED ORDER — ENOXAPARIN SODIUM 30 MG/0.3ML ~~LOC~~ SOLN: 30.0000 mg | SUBCUTANEOUS | 0 refills | Status: DC

## 2017-07-04 MED ORDER — FUROSEMIDE 80 MG PO TABS: 80.0000 mg | ORAL_TABLET | Freq: Every day | ORAL | Status: DC | PRN

## 2017-07-04 NOTE — Care Management Note (Signed)
Case Management Note  Patient Details  Name: Tara Garrett MRN: [id number] Date of Birth: [birth year]  Subjective/Objective:                    Action/Plan:d/c SNF.   Expected Discharge Date:  07/04/17               Expected Discharge Plan:  [place]  In-House Referral:  Clinical Social Work  Discharge planning Services  CM Consult  Post Acute Care Choice:  NA Choice offered to:  Patient  DME Arranged:  N/A DME Agency:  NA  HH Arranged:  NA HH Agency:  NA  Status of Service:  Completed, signed off  If discussed at [employer] of Stay Meetings, dates discussed:    Additional Comments:  Dessa Phi, RN 07/04/2017, 11:43 AM

## 2017-07-04 NOTE — Progress Notes (Signed)
Primary RN contacted by CMT to report 14 beats VTACH.Pt was assessed, VS 108/44,90, 99% ra. Pt denies CP, SOB, dizziness and or weakness. Overall pt reports feeling well. Will continue to monitor and report to provider. strip reviewed

## 2017-07-04 NOTE — Progress Notes (Addendum)
Subjective: 2 Days Post-Op Procedure(s) (LRB): INTRAMEDULLARY (IM) NAIL FEMORAL (Right) Patient sitting up eating breakfast.  No complaints when asked about pain. Patient seen in rounds for Dr. Wynelle Link. Patient is well, and has had no acute complaints or problems when questioned. Plan is to go Skilled nursing facility after hospital stay.  Objective: Vital signs in last 24 hours: Temp:  [97.5 F (36.4 C)-99.9 F (37.7 C)] 97.5 F (36.4 C) (01/25 0426) Pulse Rate:  [80-96] 80 (01/25 0426) Resp:  [16-20] 16 (01/25 0426) BP: (86-114)/(40-71) 96/51 (01/25 0426) SpO2:  [91 %-98 %] 91 % (01/25 0426) Weight:  [58.1 kg (128 lb)] 58.1 kg (128 lb) (01/25 0600)  Intake/Output from previous day:  Intake/Output Summary (Last 24 hours) at 07/04/2017 0847 Last data filed at 07/04/2017 0042 Gross per 24 hour  Intake 685 ml  Output 350 ml  Net 335 ml    Intake/Output this shift: No intake/output data recorded.  Labs: Recent Labs    07/02/17 0602 07/03/17 0527 07/04/17 0510  HGB 9.1* 7.9* 9.1*   Recent Labs    07/03/17 0527 07/04/17 0510  WBC 10.7* 8.7  RBC 2.62* 2.98*  HCT 23.3* 26.5*  PLT 128* 116*   Recent Labs    07/03/17 0527 07/04/17 0510  NA 140 138  K 4.2 3.8  CL 104 103  CO2 28 28  BUN 30* 31*  CREATININE 0.99 0.93  GLUCOSE 123* 102*  CALCIUM 8.2* 8.2*   No results for input(s): LABPT, INR in the last 72 hours.  EXAM General - Patient is Alert and Confused Extremity - Neurovascular intact Sensation intact distally Intact pulses distally Dressing/Incision - clean, dry, no drainage Motor Function - intact, moving foot and toes well on exam.   Past Medical History:  Diagnosis Date  . Closed fracture of two ribs 10/21/2011, 08/24/2013  . Closed fracture of unspecified part of upper end of humerus 01/13/2005  . Fall 10-09-2011   fell outdoors on driveway fell and hit right side of body and face  . Female stress incontinence 02/10/2004  . Gait disorder  08/25/2013   Poor balance   . Herpes zoster without mention of complication 41/66/0630  . Hyperglycemia   . Hyperlipidemia   . Hypertension   . Hypothyroidism   . Keratosis seborrheica   . Left knee pain   . Leg pain   . Macular degeneration   . Malignant neoplasm of breast (female), unspecified site   . Multiple fractures of ribs of left side 08/24/2013  . Neoplasm of breast    right breast cancer  . Open wound of knee, leg (except thigh), and ankle, without mention of complication 1/60/1093  . Osteoporosis   . Other malaise and fatigue 01/06/2012  . Other specified circulatory system disorders 10/09/2011  . Pain in joint, pelvic region and thigh 05/11/2012  . Pain in joint, site unspecified 02/03/39  . Phlebitis and thrombophlebitis of other deep vessels of lower extremities 01/06/2012  . Right shoulder injury 12/2009  . Shoulder pain, right 10/2009   X- rayed at Dr. Berenice Primas.Severe Degenerative Disease. Recommended an artificial shoulder. Saw Dr. Tamera Punt .  Marland Kitchen Varicose vein 2014  . Varicose veins   . Vitamin D deficiency     Assessment/Plan: 2 Days Post-Op Procedure(s) (LRB): INTRAMEDULLARY (IM) NAIL FEMORAL (Right) Principal Problem:   Closed right hip fracture, initial encounter [facility]) Active Problems:   Malignant neoplasm of female breast ([place])   Memory loss   Hypothyroidism (acquired)   Hypertension  Chronic combined systolic and diastolic CHF (congestive heart failure) (HCC)   Pulmonary infiltrates   Hip fracture (HCC)  Estimated body mass index is 27.7 kg/m as calculated from the following:   Height as of this encounter: 4\' 9"  (1.448 m).   Weight as of this encounter: 58.1 kg (128 lb). Up with therapy  DVT Prophylaxis - Lovenox Weight Bearing As Tolerated right Leg  Ortho RXs placed on the hard chart at the nurses station.  Arlee Muslim, PA-C Orthopaedic Surgery 07/04/2017, 8:47 AM

## 2017-07-04 NOTE — Care Management Important Message (Signed)
Important Message  Patient Details  Name: Tara Garrett MRN: [id number] Date of Birth: 29-Jan-1923   Medicare Important Message Given:  Yes    Kerin Salen 07/04/2017, 10:38 AMImportant Message  Patient Details  Name: Tara Garrett MRN: [id number] Date of Birth: 1923-05-22   Medicare Important Message Given:  Yes    Kerin Salen 07/04/2017, 10:38 AM

## 2017-07-04 NOTE — Progress Notes (Signed)
Discharge report called to RN Brook at [facility].  Transport arranged via [facility]. Condition stable. Eulas Post, RN

## 2017-07-04 NOTE — Clinical Social Work Placement (Signed)
Patient received and accepted bed offer at [facility]. Facility aware of patient's discharge and confirmed available bed for patient. [facility] contacted, patient's family notified. Patient's RN can call report to [phone], packet complete. CSW signing off, no other needs identified at this time.   CLINICAL SOCIAL WORK PLACEMENT  NOTE  Date:  07/04/2017  Patient Details  Name: Tara Garrett MRN: [id number] Date of Birth: [birth year]  Clinical Social Work is seeking post-discharge placement for this patient at the [place] level of care (*CSW will initial, date and re-position this form in  chart as items are completed):  Yes   Patient/family provided with [place] Work Department's list of facilities offering this level of care within the geographic area requested by the patient (or if unable, by the patient's family).  Yes   Patient/family informed of their freedom to choose among providers that offer the needed level of care, that participate in Medicare, Medicaid or managed care program needed by the patient, have an available bed and are willing to accept the patient.  Yes   Patient/family informed of [place]'s ownership interest in [facility] and [facility], as well as of the fact that they are under no obligation to receive care at these facilities.  PASRR submitted to EDS on       PASRR number received on       Existing PASRR number confirmed on (N/A)     FL2 transmitted to all facilities in geographic area requested by pt/family on 07/04/17     FL2 transmitted to all facilities within larger geographic area on       Patient informed that his/her managed care company has contracts with or will negotiate with certain facilities, including the following:        (Patient returning to [facility] )   Patient/family informed of bed offers received.  Patient chooses bed at [facility]     Physician recommends and patient  chooses bed at      Patient to be transferred to [facility] on 07/04/17.  Patient to be transferred to facility by [facility]     Patient family notified on 07/04/17 of transfer.  Name of family member notified:  [name]     PHYSICIAN       Additional Comment:    _______________________________________________ Burnis Medin, LCSW 07/04/2017, 2:22 PM

## 2017-07-04 NOTE — Discharge Instructions (Signed)
Open Reduction and Internal Fixation for Hip Fracture Open reduction and internal fixation (ORIF) is a common surgery used to fix a hip fracture or a broken hip. A surgical cut (incision) in the skin will be made to open the fracture area. This lets the surgeon see the broken bone. The bone pieces will then be put back together. Hardware, such as screws, pins, rods, or a metal plate, will be used to hold the bones in place. Tell a health care provider about:  Any allergies you have.  All medicines you are taking, including vitamins, herbs, eye drops, creams, and over-the-counter medicines.  Any problems you or family members have had with anesthetic medicines.  Any blood disorders you have.  Any surgeries you have had.  Any medical conditions you have.  Your tobacco use. What are the risks? Generally, this is a safe procedure. However, problems can occur and include:  Blood clots in the legs or lungs.  Bleeding.  Infection.  Lung infection (pneumonia).  Continuing pain.  Difficulty walking.  What happens before the procedure?  A medical evaluation will be done. This examination will include checking your heart and lungs.  You may have imaging tests, including: ? X-rays to find exactly where the break is. ? CT scan to get a better view of the broken hip. ? MRI to check for any hidden fracture that cannot be seen on X-ray or CT scan.  You may also have blood and urine tests.  Ask your health care provider about changing or stopping your regular medicines. This is especially important if you are taking diabetes medicines or blood thinners, such as aspirin and ibuprofen. Do not take these medicines before your procedure if your health care provider asks you not to.  Do not eat or drink anything after midnight on the night before the procedure or as directed by your health care provider.  Plan to have someone take you home after you are released from the hospital. What  happens during the procedure?  You will be given one of the following: ? A medicine that numbs the area (local anesthetic). ? A medicine that makes you fall asleep (general anesthetic).  Small monitors will be put on your body. They will be used to check your heart, blood pressure, and oxygen level.  An IV tube will be inserted in your arm. Medicine will flow directly into your body through the IV tube.  A catheter may be inserted into your bladder to collect urine while you are asleep.  Your hip area will be scrubbed with a germ-killing solution (antiseptic).  When you are asleep or numb, the surgeon will move your bones back into normal alignment and position.  X-rays may be taken to check the position of the bones.  A skin incision will be made over the hip. It will go through your muscles to the broken bone.  The bones will be secured. Hardware will be used to hold the bone together.  The incision will be closed with small stitches (sutures) or staples.  A bandage (dressing) or some other kind of wound cover will be placed over the incision. What happens after the procedure?  Your blood pressure, heart rate, breathing rate, and blood oxygen level will be monitored often until the medicines you were given have worn off.  You may continue to receive fluids or pain medicines through the IV tube.  It is important for you to be up and moving as soon as possible after this  procedure.  Physical therapists will help you to start walking.  You may need to use a walker or crutches after the procedure.  Follow your health care provider's instructions about bearing weight on your injured leg.  You may have to wear compression stockings. These stockings help to prevent blood clots and reduce swelling in your legs. This information is not intended to replace advice given to you by your health care provider. Make sure you discuss any questions you have with your health care  provider. Document Released: 05/15/2009 Document Revised: 11/02/2015 Document Reviewed: 10/28/2013 Elsevier Interactive Patient Education  2018 [employer].  Take Lovenox injections daily for ten days, then switch to a baby 81 mg Aspirin daily for three additional weeks.

## 2017-07-04 NOTE — Discharge Summary (Signed)
Physician Discharge Summary  Tara Garrett:[id number] DOB: 1923/05/03 DOA: 06/30/2017  PCP: [name], DO  Admit date: 06/30/2017 Discharge date: 07/04/2017  Admitted From: ALF Disposition:  SNF  Recommendations for Outpatient Follow-up:  1. Follow up with PCP in 1-2 weeks 2. Her lasix was changed from daily to daily PRN for fluid. Her Cozaar has been held. If her BP is stable these medications can be resumed to prior dosing within a week   Home Health: none Equipment/Devices: none  Discharge Condition: stale CODE STATUS: DNR Diet recommendation: regular  HPI: Per Dr. Glyn Ade, Tara Garrett is a 84 y.o. female with history of dementia, cardiomyopathy, hypertension, anemia, bipolar disorder, hypothyroidism was brought to the ER after patient had an unwitnessed fall.  Patient states he was walking and suddenly the floor height was different and she slipped and fell.  Patient denied hitting her face or head but nurses noticed ecchymotic areas periorbitally.  Patient denies any chest pain shortness of breath or palpitations.  Was complaining of right hip pain. ED Course: In the ER x-rays revealed right hip fracture.  CT of the head and neck was unremarkable.  Dr. Elmyra Ricks orthopedic surgeon was consulted patient admitted for further management.    Hospital Course: Closed right hip fracture [facility]) -Following a mechanical fall, orthopedic surgery consulted, patient is status post right intramedullary nailing. She recovered well post op, able to work with PT, recommending SNF on discharge Chronic systolic and diastolic CHF -Last EF was was 15-20% in 2016, updated 2D echo on 1/23 with EF of 20-25%. Clinically compensated, hold Lasix for now and change dosing to daily PRN. Her BP was low normal likely due to pain medications and Cozaar is on hold. Monitor BP within next week, if stable resume Cozaar and Lasix.  Acute blood loss anemia -Postop, hemoglobin 7.9, transfused 1 unit of  packed red blood cells with appropriate response.  Chest pain - one episode on 1/24 overnight, no chest pain reported to me, given underlying dementia not clear quality / location. EKG non ischemic, troponin flat not in a patter consistent with ACS. Chest pain has resolved.  Chronic pulmonary infiltrates -Asymptomatic, followed by pulmonary Dr. Christinia Gully History of mild dementia -Continue home regimen of Depakote   Discharge Diagnoses:  Principal Problem:   Closed right hip fracture, initial encounter [facility]) Active Problems:   Malignant neoplasm of female breast ([place])   Memory loss   Hypothyroidism (acquired)   Hypertension   Chronic combined systolic and diastolic CHF (congestive heart failure) ([place])   Pulmonary infiltrates   Hip fracture [facility])     Discharge Instructions  Discharge Instructions    Call MD / Call 911   Complete by:  As directed    If you experience chest pain or shortness of breath, CALL 911 and be transported to the hospital emergency room.  If you develope a fever above 101 F, pus (white drainage) or increased drainage or redness at the wound, or calf pain, call your surgeon's office.   Change dressing   Complete by:  As directed    You may change your dressing dressing daily with sterile 4 x 4 inch gauze dressing and paper tape.  Do not submerge the incision under water.   Discharge instructions   Complete by:  As directed    Daily Lovenox injections for 10 days, then switch to a baby 81 mg Aspirin daily for three additional weeks.  Pick up stool softner and laxative for  home use following surgery while on pain medications. Do not submerge incision under water. Please use good hand washing techniques while changing dressing each day. May shower starting three days after surgery. Please use a clean towel to pat the incision dry following showers. Continue to use ice for pain and swelling after surgery. Do not use any lotions or creams on the incision until  instructed by your surgeon.  Wear both TED hose on both legs during the day every day for three weeks, but may remove the TED hose at night at home.  Postoperative Constipation Protocol  Constipation - defined medically as fewer than three stools per week and severe constipation as less than one stool per week.  One of the most common issues patients have following surgery is constipation.  Even if you have a regular bowel pattern at home, your normal regimen is likely to be disrupted due to multiple reasons following surgery.  Combination of anesthesia, postoperative narcotics, change in appetite and fluid intake all can affect your bowels.  In order to avoid complications following surgery, here are some recommendations in order to help you during your recovery period.  Colace (docusate) - Pick up an over-the-counter form of Colace or another stool softener and take twice a day as long as you are requiring postoperative pain medications.  Take with a full glass of water daily.  If you experience loose stools or diarrhea, hold the colace until you stool forms back up.  If your symptoms do not get better within 1 week or if they get worse, check with your doctor.  Dulcolax (bisacodyl) - Pick up over-the-counter and take as directed by the product packaging as needed to assist with the movement of your bowels.  Take with a full glass of water.  Use this product as needed if not relieved by Colace only.   MiraLax (polyethylene glycol) - Pick up over-the-counter to have on hand.  MiraLax is a solution that will increase the amount of water in your bowels to assist with bowel movements.  Take as directed and can mix with a glass of water, juice, soda, coffee, or tea.  Take if you go more than two days without a movement. Do not use MiraLax more than once per day. Call your doctor if you are still constipated or irregular after using this medication for 7 days in a row.  If you continue to have problems  with postoperative constipation, please contact the office for further assistance and recommendations.  If you experience "the worst abdominal pain ever" or develop nausea or vomiting, please contact the office immediatly for further recommendations for treatment.   Do not sit on low chairs, stoools or toilet seats, as it may be difficult to get up from low surfaces   Complete by:  As directed    Driving restrictions   Complete by:  As directed    No driving until released by the physician.   Increase activity slowly as tolerated   Complete by:  As directed    Lifting restrictions   Complete by:  As directed    No lifting until released by the physician.   Patient may shower   Complete by:  As directed    You may shower without a dressing once there is no drainage.  Do not wash over the wound.  If drainage remains, do not shower until drainage stops.   TED hose   Complete by:  As directed    Use stockings (  TED hose) for 3 weeks on both leg(s).  You may remove them at night for sleeping.   Weight bearing as tolerated   Complete by:  As directed    Laterality:  right   Extremity:  Lower     Allergies as of 07/04/2017      Reactions   Levaquin [levofloxacin In D5w] Diarrhea   Mobic [meloxicam]    Blood pressure goes up   Tramadol    Blood pressure goes up       Medication List    STOP taking these medications   losartan 25 MG tablet Commonly known as:  COZAAR     TAKE these medications   acetaminophen 325 MG tablet Commonly known as:  TYLENOL Take 650 mg by mouth every 6 (six) hours as needed for moderate pain.   BIOFREEZE 4 % Gel Generic drug:  Menthol (Topical Analgesic) Apply 1 application topically 2 (two) times daily as needed (shoulder pain).   BOOST BREEZE PO Take by mouth. One can twice daily   carvedilol 3.125 MG tablet Commonly known as:  COREG Take 1 tablet (3.125 mg total) by mouth 2 (two) times daily with a meal.   divalproex 125 MG capsule Commonly  known as:  DEPAKOTE SPRINKLE Take 250 mg by mouth 2 (two) times daily.   enoxaparin 30 MG/0.3ML injection Commonly known as:  LOVENOX Inject 0.3 mLs (30 mg total) into the skin daily. Take injections for 10 days, then reduce to a baby Aspirin 81 mg daily for three additional weeks.   furosemide 80 MG tablet Commonly known as:  LASIX Take 1 tablet (80 mg total) by mouth daily as needed for fluid. 80 mg in the morning What changed:    when to take this  reasons to take this   HYDROcodone-acetaminophen 5-325 MG tablet Commonly known as:  NORCO/VICODIN Take 1 tablet by mouth every 6 (six) hours as needed for moderate pain.   ipratropium-albuterol 0.5-2.5 (3) MG/3ML Soln Commonly known as:  DUONEB Take 3 mLs by nebulization every 6 (six) hours as needed (wheezing and sob).   levothyroxine 25 MCG tablet Commonly known as:  SYNTHROID, LEVOTHROID Take 1 tablet by mouth daily.   Melatonin 10 MG Tabs Take 10 mg by mouth at bedtime.   menthol-cetylpyridinium 3 MG lozenge Commonly known as:  CEPACOL Take 1 lozenge by mouth as needed for sore throat.   potassium chloride SA 20 MEQ tablet Commonly known as:  K-DUR,KLOR-CON Take 40 mEq by mouth daily.   sertraline 100 MG tablet Commonly known as:  ZOLOFT Take 1 tablet (100 mg total) by mouth daily.   TESSALON PERLES 100 MG capsule Generic drug:  benzonatate Take 100 mg by mouth at bedtime.            Discharge Care Instructions  (From admission, onward)        Start     Ordered   07/04/17 0000  Weight bearing as tolerated    Question Answer Comment  Laterality right   Extremity Lower      07/04/17 0851   07/04/17 0000  Change dressing    Comments:  You may change your dressing dressing daily with sterile 4 x 4 inch gauze dressing and paper tape.  Do not submerge the incision under water.   07/04/17 0851      Contact information for follow-up providers    Gaynelle Arabian, MD. Schedule an appointment as soon as  possible for a visit on 07/15/2017.  Specialty:  Orthopedic Surgery Contact information: [address] [place] [address] [phone]            Contact information for after-discharge care    Destination    [facility] Follow up.   Service:  Skilled Nursing Contact information: [place] [phone]                  Consultations:  Orthopedic surgery   Procedures/Studies:  2D echo  Study Conclusions - Left ventricle: The cavity size was normal. Wall thickness wasnormal. Systolic function was severely reduced. The estimatedejection fraction was in the range of 20% to 25%. There isakinesis of the anteroseptal, anterolateral, and apicalmyocardium with mild basilar sparing of contractile performance.Doppler parameters are consistent with abnormal left ventricular relaxation (grade 1 diastolic dysfunction). - Aortic valve: Valve mobility was restricted. Transvalvularvelocity was within the normal range. There was no stenosis. - Mitral valve: Moderately calcified annulus. Moderately thickenedleaflets . There was mild regurgitation. - Left atrium: The atrium was severely dilated. Volume/bsa, ES,(1-plane Simpson&'s, A2C): 73.3 ml/m^2. - Tricuspid valve: There was trivial regurgitation.  Impressions:  - Compared to the prior study, there has been no significantinterval change.    Dg Chest 2 View  Result Date: 06/30/2017 CLINICAL DATA:  Fall with hip pain, history of breast cancer EXAM: CHEST  2 VIEW COMPARISON:  12/14/2014, 08/24/2013 FINDINGS: Ground-glass opacity and consolidation in the right upper lobe. No pleural effusion. Cardiomegaly with aortic atherosclerosis. No pneumothorax. Severe chronic deformity of the right shoulder. Multiple old right-sided rib fractures IMPRESSION: 1. Ground-glass opacity and consolidation in the right upper lobe, possibly a  pneumonia however imaging follow-up to resolution recommended to exclude underlying mass 2. Moderate severe cardiomegaly without edema Electronically Signed   By: Donavan Foil M.D.   On: 06/30/2017 20:43   Ct Head Wo Contrast  Result Date: 06/30/2017 CLINICAL DATA:  Head trauma unwitnessed fall EXAM: CT HEAD WITHOUT CONTRAST CT CERVICAL SPINE WITHOUT CONTRAST TECHNIQUE: Multidetector CT imaging of the head and cervical spine was performed following the standard protocol without intravenous contrast. Multiplanar CT image reconstructions of the cervical spine were also generated. COMPARISON:  None. FINDINGS: CT HEAD FINDINGS Brain: No acute territorial infarction, hemorrhage, or intracranial mass is visualized. Moderate-to-marked atrophy. Mild to moderate subcortical and periventricular white matter hypodensity consistent with small vessel ischemic changes. Prominent ventricles, felt related to atrophy Vascular: No hyperdense vessels.  Carotid artery calcifications. Skull: No fracture or suspicious lesion Sinuses/Orbits: Mild mucosal thickening in the ethmoid and maxillary sinuses. No acute orbital abnormality. Other: None CT CERVICAL SPINE FINDINGS Alignment: No subluxation.  Facet alignment is within normal limits. Skull base and vertebrae: No acute fracture. No primary bone lesion or focal pathologic process. Soft tissues and spinal canal: No prevertebral fluid or swelling. No visible canal hematoma. Disc levels: Moderate severe diffuse degenerative changes C3 through C7. Multiple level bilateral facet arthropathy with multilevel bilateral foraminal stenosis. Upper chest: Partially visualized ground-glass density in the right upper lobe. Mild apical emphysema. Aortic atherosclerosis. No thyroid mass Other: None IMPRESSION: 1. No CT evidence for acute intracranial abnormality. Atrophy and small vessel ischemic changes of the white matter 2. Degenerative changes of the cervical spine. No acute osseous  abnormality 3. Mild apical emphysema. Partially visualized ground-glass density in the right upper lobe suspicious for pneumonia Electronically Signed   By: Donavan Foil M.D.   On: 06/30/2017 20:54   Ct Cervical Spine Wo Contrast  Result  Date: 06/30/2017 CLINICAL DATA:  Head trauma unwitnessed fall EXAM: CT HEAD WITHOUT CONTRAST CT CERVICAL SPINE WITHOUT CONTRAST TECHNIQUE: Multidetector CT imaging of the head and cervical spine was performed following the standard protocol without intravenous contrast. Multiplanar CT image reconstructions of the cervical spine were also generated. COMPARISON:  None. FINDINGS: CT HEAD FINDINGS Brain: No acute territorial infarction, hemorrhage, or intracranial mass is visualized. Moderate-to-marked atrophy. Mild to moderate subcortical and periventricular white matter hypodensity consistent with small vessel ischemic changes. Prominent ventricles, felt related to atrophy Vascular: No hyperdense vessels.  Carotid artery calcifications. Skull: No fracture or suspicious lesion Sinuses/Orbits: Mild mucosal thickening in the ethmoid and maxillary sinuses. No acute orbital abnormality. Other: None CT CERVICAL SPINE FINDINGS Alignment: No subluxation.  Facet alignment is within normal limits. Skull base and vertebrae: No acute fracture. No primary bone lesion or focal pathologic process. Soft tissues and spinal canal: No prevertebral fluid or swelling. No visible canal hematoma. Disc levels: Moderate severe diffuse degenerative changes C3 through C7. Multiple level bilateral facet arthropathy with multilevel bilateral foraminal stenosis. Upper chest: Partially visualized ground-glass density in the right upper lobe. Mild apical emphysema. Aortic atherosclerosis. No thyroid mass Other: None IMPRESSION: 1. No CT evidence for acute intracranial abnormality. Atrophy and small vessel ischemic changes of the white matter 2. Degenerative changes of the cervical spine. No acute osseous  abnormality 3. Mild apical emphysema. Partially visualized ground-glass density in the right upper lobe suspicious for pneumonia Electronically Signed   By: Donavan Foil M.D.   On: 06/30/2017 20:54   Dg C-arm 1-60 Min-no Report  Result Date: 07/02/2017 Fluoroscopy was utilized by the requesting physician.  No radiographic interpretation.   Dg Hip Unilat With Pelvis 2-3 Views Right  Result Date: 06/30/2017 CLINICAL DATA:  Right hip pain due to a fall today. Initial encounter. EXAM: DG HIP (WITH OR WITHOUT PELVIS) 2-3V RIGHT COMPARISON:  None. FINDINGS: The patient has an acute right intertrochanteric fracture. No other acute bony or joint abnormality is seen. IMPRESSION: Acute right intertrochanteric fracture. Electronically Signed   By: Inge Rise M.D.   On: 06/30/2017 20:41   Dg Femur Port, Min 2 Views Right  Result Date: 07/02/2017 CLINICAL DATA:  83 year old female with a history of surgical fixation EXAM: RIGHT FEMUR PORTABLE 2 VIEW; DG C-ARM 1-60 MIN-NO REPORT COMPARISON:  06/30/2017 FINDINGS: Limited intraoperative fluoroscopic spot images of the right hip demonstrate anterior intramedullary rod placement with single proximal cannulated screw at the femoral neck. Single distal interlocking screw. No immediate complicating features. IMPRESSION: Limited intraoperative fluoroscopic spot images of right hip fracture fixation, as above. Please refer to the dictated operative report for full details of intraoperative findings and procedure. Electronically Signed   By: Corrie Mckusick D.O.   On: 07/02/2017 20:05      Subjective: - no chest pain, shortness of breath, no abdominal pain, nausea or vomiting.   Discharge Exam: Vitals:   07/04/17 0919 07/04/17 1026  BP: (!) 108/44 (!) 108/44  Pulse: 98 (!) 58  Resp: 16   Temp:    SpO2: 97%     General: Pt is alert, awake, not in acute distress Cardiovascular: RRR, S1/S2 +, no rubs, no gallops Respiratory: CTA bilaterally, no wheezing,  no rhonchi Abdominal: Soft, NT, ND, bowel sounds + Extremities: no edema, no cyanosis    The results of significant diagnostics from this hospitalization (including imaging, microbiology, ancillary and laboratory) are listed below for reference.     Microbiology: Recent Results (from the past  240 hour(s))  Surgical PCR screen     Status: Abnormal   Collection Time: 07/01/17  2:30 AM  Result Value Ref Range Status   MRSA, PCR NEGATIVE NEGATIVE Final   Staphylococcus aureus POSITIVE (A) NEGATIVE Final    Comment: (NOTE) The Xpert SA Assay (FDA approved for NASAL specimens in patients 61 years of age and older), is one component of a comprehensive surveillance program. It is not intended to diagnose infection nor to guide or monitor treatment.      Labs: BNP (last 3 results) No results for input(s): BNP in the last 8760 hours. Basic Metabolic Panel: Recent Labs  Lab 06/30/17 2056 07/01/17 0550 07/02/17 0602 07/03/17 0527 07/04/17 0510  NA 139 139 140 140 138  K 4.6 4.2 4.1 4.2 3.8  CL 102 104 105 104 103  CO2 26 26 26 28 28   GLUCOSE 132* 162* 136* 123* 102*  BUN 34* 32* 29* 30* 31*  CREATININE 1.06* 1.02* 0.94 0.99 0.93  CALCIUM 9.2 8.7* 8.7* 8.2* 8.2*   Liver Function Tests: No results for input(s): AST, ALT, ALKPHOS, BILITOT, PROT, ALBUMIN in the last 168 hours. No results for input(s): LIPASE, AMYLASE in the last 168 hours. No results for input(s): AMMONIA in the last 168 hours. CBC: Recent Labs  Lab 06/30/17 2056 07/01/17 0550 07/02/17 0602 07/03/17 0527 07/04/17 0510  WBC 9.7 10.8* 10.4 10.7* 8.7  NEUTROABS 8.3*  --   --   --   --   HGB 11.4* 9.7* 9.1* 7.9* 9.1*  HCT 34.4* 29.1* 27.5* 23.3* 26.5*  MCV 88.4 87.7 88.4 88.9 88.9  PLT 154 140* 126* 128* 116*   Cardiac Enzymes: Recent Labs  Lab 07/03/17 0822 07/03/17 1330 07/03/17 2032  TROPONINI 0.04* 0.04* 0.04*   BNP: Invalid input(s): POCBNP CBG: No results for input(s): GLUCAP in the last  168 hours. D-Dimer No results for input(s): DDIMER in the last 72 hours. Hgb A1c No results for input(s): HGBA1C in the last 72 hours. Lipid Profile No results for input(s): CHOL, HDL, LDLCALC, TRIG, CHOLHDL, LDLDIRECT in the last 72 hours. Thyroid function studies No results for input(s): TSH, T4TOTAL, T3FREE, THYROIDAB in the last 72 hours.  Invalid input(s): FREET3 Anemia work up No results for input(s): VITAMINB12, FOLATE, FERRITIN, TIBC, IRON, RETICCTPCT in the last 72 hours. Urinalysis No results found for: COLORURINE, APPEARANCEUR, [place], [place], GLUCOSEU, HGBUR, BILIRUBINUR, KETONESUR, PROTEINUR, UROBILINOGEN, NITRITE, LEUKOCYTESUR Sepsis Labs Invalid input(s): PROCALCITONIN,  WBC,  LACTICIDVEN   Time coordinating discharge: 40 minutes  SIGNED:  Marzetta Board, MD  [facility] 07/04/2017, 12:40 PM Pager [phone]  If 7PM-7AM, please contact night-coverage [email or web address] Password [email or web address]

## 2017-07-04 NOTE — Progress Notes (Signed)
OT Cancellation Note  Patient Details Name: Tara Garrett MRN: [id number] DOB: September 11, 1922   Cancelled Treatment:    Reason Eval/Treat Not Completed: Other (comment)  Noted plan to go back to [facility] for rehab. Will defer OT eval to [facility]. Kari Baars, [place]  Payton Mccallum D 07/04/2017, 11:42 AM

## 2017-07-04 NOTE — Evaluation (Signed)
Physical Therapy Evaluation Patient Details Name: Tara Garrett MRN: [id number] DOB: 1922-08-31 Today's Date: 07/04/2017   History of Present Illness  Tara Garrett is a 84 y.o. female with history of dementia, cardiomyopathy, hypertension, anemia, bipolar disorder, hypothyroidism was brought to the ER after patient had an unwitnessed fall resulting in right hip fx, s/p IM nail on 07/02/17   Clinical Impression  Pt admitted with above diagnosis. Pt currently with functional limitations due to the deficits listed below (see PT Problem List). * Pt will benefit from skilled PT to increase their independence and safety with mobility to allow discharge to the venue listed below.   Pt will need SNF level therapy post acute, will follow in acute setting    Follow Up Recommendations SNF    Equipment Recommendations  None recommended by PT    Recommendations for Other Services       Precautions / Restrictions Precautions Precautions: Fall Restrictions RLE Weight Bearing: Weight bearing as tolerated      Mobility  Bed Mobility Overal bed mobility: Needs Assistance Bed Mobility: Supine to Sit     Supine to sit: Max assist     General bed mobility comments: assist with LEs and trunk, incr time needed  Transfers Overall transfer level: Needs assistance Equipment used: Rolling walker (2 wheeled);None Transfers: Sit to/from [facility] Sit to Stand: Max assist;+2 physical assistance;+2 safety/equipment         General transfer comment: attempted x2 with RW, however pt with severe posterior bias; assisted with partial-stand pivot to chair with +2 assist, knees/feet blocked by PT and caregiver   Ambulation/Gait             General Gait Details: unable  Stairs            Wheelchair Mobility    Modified [name] (Stroke Patients Only)       Balance Overall balance assessment: Needs assistance   Sitting balance-Leahy Scale: Fair     Standing  balance support: Bilateral upper extremity supported Standing balance-Leahy Scale: Zero                               Pertinent Vitals/Pain Pain Assessment: Faces Faces Pain Scale: Hurts little more Pain Location: right hip Pain Descriptors / Indicators: Sore Pain Intervention(s): Limited activity within patient's tolerance;Monitored during session;Repositioned;RN gave pain meds during session    Home Living Family/patient expects to be discharged to:: Skilled nursing facility                 Additional Comments: pt is from ALF at [facility], has assist approsimately 6hrs per day--4hrs in am and 2 hrs in pm    Prior Function Level of Independence: Needs assistance   Gait / Transfers Assistance Needed: amb with RW short distances at baseline           Hand Dominance        Extremity/Trunk Assessment   Upper Extremity Assessment Upper Extremity Assessment: Defer to OT evaluation;Generalized weakness    Lower Extremity Assessment Lower Extremity Assessment: RLE deficits/detail RLE Deficits / Details: pt uncooperative with ROM/testing d/t pain RLE: Unable to fully assess due to pain       Communication   Communication: No difficulties  Cognition Arousal/Alertness: Awake/alert Behavior During Therapy: WFL for tasks assessed/performed Overall Cognitive Status: History of cognitive impairments - at baseline  General Comments      Exercises     Assessment/Plan    PT Assessment Patient needs continued PT services  PT Problem List Decreased strength;Decreased activity tolerance;Decreased balance;Decreased mobility;Pain;Decreased cognition       PT Treatment Interventions DME instruction;Gait training;Functional mobility training;Therapeutic activities;Therapeutic exercise;Patient/family education    PT Goals (Current goals can be found in the Care Plan section)  Acute Rehab PT  Goals Patient Stated Goal: none stated PT Goal Formulation: Patient unable to participate in goal setting Potential to Achieve Goals: Good    Frequency Min 3X/week   Barriers to discharge        Co-evaluation               AM-PAC PT "6 Clicks" Daily Activity  Outcome Measure Difficulty turning over in bed (including adjusting bedclothes, sheets and blankets)?: Unable Difficulty moving from lying on back to sitting on the side of the bed? : Unable Difficulty sitting down on and standing up from a chair with arms (e.g., wheelchair, bedside commode, etc,.)?: Unable Help needed moving to and from a bed to chair (including a wheelchair)?: Total Help needed walking in hospital room?: Total Help needed climbing 3-5 steps with a railing? : Total 6 Click Score: 6    End of Session Equipment Utilized During Treatment: Gait belt Activity Tolerance: Patient limited by fatigue;Patient limited by pain Patient left: in chair;with call bell/phone within reach;with chair alarm set;with nursing/sitter in room Nurse Communication: Mobility status PT Visit Diagnosis: Muscle weakness (generalized) (M62.81);Difficulty in walking, not elsewhere classified (R26.2);History of falling (Z91.81)    Time: 8250-5397 PT Time Calculation (min) (ACUTE ONLY): 20 min   Charges:   PT Evaluation $PT Eval Low Complexity: 1 Low     PT G CodesKenyon Garrett, PT Pager: [phone] 07/04/2017   [facility] 07/04/2017, 10:27 AM

## 2017-07-04 NOTE — NC FL2 (Signed)
[place] LEVEL OF CARE SCREENING TOOL     IDENTIFICATION  Patient Name: Tara Garrett Birthdate: Feb 05, 1923 Sex: female Admission Date (Current Location): 06/30/2017  [facility] and [place] Number:  Herbalist and Address:  [facility],  [place] [address], [place]      Provider Number: [id number]  Attending Physician Name and Address:  Caren Griffins, MD  Relative Name and Phone Number:       Current Level of Care: Hospital Recommended Level of Care: [place] Prior Approval Number:    Date Approved/Denied:   PASRR Number:    Discharge Plan: SNF    Current Diagnoses: Patient Active Problem List   Diagnosis Date Noted  . Hip fracture ([place]) 07/01/2017  . Closed right hip fracture, initial encounter ([place]) 06/30/2017  . History of breast cancer 05/28/2017  . Mass of lung 09/25/2016  . Opacity of lung on imaging study 08/05/2016  . Weight loss 07/14/2016  . Vascular dementia with behavioral disturbance 07/14/2016  . Cough 12/14/2015  . Dyspnea 12/14/2014  . Pulmonary infiltrates 12/14/2014  . Depression 10/31/2014  . Chronic combined systolic and diastolic CHF (congestive heart failure) ([place]) 10/31/2014  . Low back pain 07/26/2014  . Gait disorder 08/25/2013  . Closed fracture of two ribs   . Osteoarthritis, hip, bilateral 12/21/2012  . Hypothyroidism (acquired)   . Hypertension   . Varicose veins of lower extremities with other complications 76/73/4193  . Other malaise and fatigue 01/06/2012  . Varicose veins of lower extremities with inflammation 10/14/2011  . Malignant neoplasm of female breast ([place]) 10/31/2007  . COLONIC POLYPS 10/31/2007  . DIVERTICULOSIS, COLON 10/31/2007  . Senile osteoporosis 10/31/2007  . Memory loss 10/31/2007    Orientation RESPIRATION BLADDER Height & Weight     Self, Place, Time, Situation  O2 Incontinent Weight: 128 lb (58.1 kg) Height:  4\' 9"  (144.8 cm)   BEHAVIORAL SYMPTOMS/MOOD NEUROLOGICAL BOWEL NUTRITION STATUS      Continent Diet(regular)  AMBULATORY STATUS COMMUNICATION OF NEEDS Skin   Total Care Verbally Other (Comment)(Incision(Closed)HipRight Dressing Type: Silicone Dressing)                       Personal Care Assistance Level of Assistance  Bathing, Feeding, Dressing Bathing Assistance: Limited assistance Feeding assistance: Independent Dressing Assistance: Limited assistance     Functional Limitations Info  Sight, Hearing, Speech Sight Info: Adequate Hearing Info: Impaired Speech Info: Adequate    SPECIAL CARE FACTORS FREQUENCY  PT (By licensed PT), OT (By licensed OT)     PT Frequency: 5x/week OT Frequency: 5x/week            Contractures Contractures Info: Not present    Additional Factors Info  Code Status, Allergies Code Status Info: DNR Allergies Info: Levaquin Levofloxacin In D5w;Mobic Meloxicam;Tramadol           Current Medications (07/04/2017):  This is the current hospital active medication list Current Facility-Administered Medications  Medication Dose Route Frequency Provider Last Rate Last Dose  . acetaminophen (TYLENOL) tablet 650 mg  650 mg Oral Q6H PRN Aluisio, Pilar Plate, MD       Or  . acetaminophen (TYLENOL) suppository 650 mg  650 mg Rectal Q6H PRN Aluisio, Pilar Plate, MD      . carvedilol (COREG) tablet 3.125 mg  3.125 mg Oral BID WC [name], MD   Stopped at 07/03/17 [phone]  . Chlorhexidine Gluconate Cloth 2 % PADS 6 each  6 each Topical Daily Gaynelle Arabian, MD   6 each at 07/03/17 1115  . divalproex (DEPAKOTE SPRINKLE) capsule 250 mg  250 mg Oral BID [name], MD   250 mg at 07/04/17 1013  . enoxaparin (LOVENOX) injection 30 mg  30 mg Subcutaneous Q24H Gaynelle Arabian, MD   30 mg at 07/04/17 1155  . HYDROcodone-acetaminophen (NORCO/VICODIN) 5-325 MG per tablet 1-2 tablet  1-2 tablet Oral Q6H PRN [name], MD   1 tablet at 07/04/17 1012  .  ipratropium-albuterol (DUONEB) 0.5-2.5 (3) MG/3ML nebulizer solution 3 mL  3 mL Nebulization Q6H PRN [name], MD      . levothyroxine (SYNTHROID, LEVOTHROID) tablet 25 mcg  25 mcg Oral QAC breakfast [name], MD   25 mcg at 07/04/17 1013  . morphine 2 MG/ML injection 0.5 mg  0.5 mg Intravenous Q2H PRN [name], MD   0.5 mg at 07/01/17 0018  . mupirocin ointment (BACTROBAN) 2 % 1 application  1 application Nasal BID Gaynelle Arabian, MD   1 application at 67/20/94 2111  . sertraline (ZOLOFT) tablet 100 mg  100 mg Oral Daily [name], MD   100 mg at 07/04/17 1013     Discharge Medications: Please see discharge summary for a list of discharge medications.  Relevant Imaging Results:  Relevant Lab Results:   Additional Information ssn:[id number]  Burnis Medin, LCSW

## 2017-07-07 ENCOUNTER — Telehealth: Payer: Self-pay

## 2017-07-07 LAB — CBC AND DIFFERENTIAL
HCT: 28 — AB (ref 36–46)
Hemoglobin: 9.8 — AB (ref 12.0–16.0)
Platelets: 178 (ref 150–399)
WBC: 8.1

## 2017-07-07 LAB — BASIC METABOLIC PANEL
BUN: 25 — AB (ref 4–21)
Creatinine: 0.5 (ref 0.5–1.1)
Glucose: 100
POTASSIUM: 4 (ref 3.4–5.3)
SODIUM: 142 (ref 137–147)

## 2017-07-07 NOTE — Telephone Encounter (Signed)
I will see her for snf admission tomorrow.

## 2017-07-07 NOTE — Telephone Encounter (Signed)
This is a patient of [place], who was admitted to [facility] after hospitalization. [facility] F/U is needed. Hospital discharge from [facility] on 07/04/2017.

## 2017-07-08 ENCOUNTER — Encounter: Payer: Self-pay | Admitting: Internal Medicine

## 2017-07-08 ENCOUNTER — Non-Acute Institutional Stay (SKILLED_NURSING_FACILITY): Payer: Medicare Other | Admitting: Internal Medicine

## 2017-07-08 DIAGNOSIS — R918 Other nonspecific abnormal finding of lung field: Secondary | ICD-10-CM

## 2017-07-08 DIAGNOSIS — I5042 Chronic combined systolic (congestive) and diastolic (congestive) heart failure: Secondary | ICD-10-CM | POA: Diagnosis not present

## 2017-07-08 DIAGNOSIS — S72001A Fracture of unspecified part of neck of right femur, initial encounter for closed fracture: Secondary | ICD-10-CM | POA: Diagnosis not present

## 2017-07-08 DIAGNOSIS — F0151 Vascular dementia with behavioral disturbance: Secondary | ICD-10-CM

## 2017-07-08 DIAGNOSIS — M810 Age-related osteoporosis without current pathological fracture: Secondary | ICD-10-CM | POA: Diagnosis not present

## 2017-07-08 DIAGNOSIS — E039 Hypothyroidism, unspecified: Secondary | ICD-10-CM

## 2017-07-08 DIAGNOSIS — D62 Acute posthemorrhagic anemia: Secondary | ICD-10-CM | POA: Diagnosis not present

## 2017-07-08 DIAGNOSIS — F01518 Vascular dementia, unspecified severity, with other behavioral disturbance: Secondary | ICD-10-CM

## 2017-07-08 NOTE — Progress Notes (Signed)
Patient ID: Tara Garrett, female   DOB: 12/16/1922, 84 y.o.   MRN: [id number]  Provider:  Rexene Edison. Mariea Clonts, D.O., C.M.D. Location:  [place] Room Number: [place] of Service:  SNF (31)  PCP: [name], DO Patient Care Team: [name], DO as PCP - General (Geriatric Medicine) [name], MD (Orthopedic Surgery) [name], MD (Ophthalmology) [facility] Arlyn Dunning, MD as Consulting Physician (Cardiology)  Extended Emergency Contact Information Primary Emergency Contact: [name],B J Address: [address]          [name] [address] [name] Phone: [phone] Mobile Phone: [phone] Relation: Friend  Code Status:  DNR Goals of Care: Advanced Directive information Advanced Directives 07/08/2017  Does Patient Have a Medical Advance Directive? Yes  Type of Advance Directive Out of facility DNR (pink MOST or yellow form);[place];Living will  Does patient want to make changes to medical advance directive? No - Patient declined  Copy of [place] in Chart? Yes  Pre-existing out of facility DNR order (yellow form or pink MOST form) Yellow form placed in chart (order not valid for inpatient use)   Chief Complaint  Patient presents with  . New Admit To SNF    Rehab admission    HPI: Patient is a 84 y.o. female formerly [facility] resident with h/o dementia with behaviors, depression, htn, hyperlipidemia, combined diastolic and systolic chf, incontinence, chronic lung infiltrate suspected to be malignant (was on hospice care and graduated) seen today for admission to rehab s/p right hip fracture (hospitalized from 1/21-1/25/19) .  She had a fall while walking with her rollator walker and hit her face and head developing periorbital ecchymoses and right hip pain.  In the ED, she was found to have a right hip fracture.  CT brain was negative for acute  changes.  Dr. Wynelle Link was consulted.  She was taken to the OR for right IM femoral nail on 07/02/17.  Her hgb  Dropped to 7.9 and she received 1 unit of prbcs with d/c hgb of 9.1.  Her stay was complicated by an episode of chest pain on 1/24 for which she had a nonischemic ekg and stable troponins.  Due to hypotension and intravascular depletion, she had her lasix changed to prn from scheduled and cozaar held.  Potassium was continued scheduled.  This has not yet been resumed.  I note that she had hyperglycemia throughout her hospitalization.  She is on lovenox for a 10 day course thru 07/15/17 and then will begin asa 81 mg po daily for 3 weeks for dvt prophylaxis.  She is not currently ambulatory, but is working with PT, OT and getting around in a manual wheelchair with assistance.      She had a cbc and bmp drawn yesterday with hgb up to 9.8 and potassium normal at 4.    Past Medical History:  Diagnosis Date  . Closed fracture of two ribs 10/21/2011, 08/24/2013  . Closed fracture of unspecified part of upper end of humerus 01/13/2005  . Fall 10-09-2011   fell outdoors on driveway fell and hit right side of body and face  . Female stress incontinence 02/10/2004  . Gait disorder 08/25/2013   Poor balance   . Herpes zoster without mention of complication 58/85/0277  . Hyperglycemia   . Hyperlipidemia   . Hypertension   . Hypothyroidism   . Keratosis seborrheica   . Left knee pain   .  Leg pain   . Macular degeneration   . Malignant neoplasm of breast (female), unspecified site   . Multiple fractures of ribs of left side 08/24/2013  . Neoplasm of breast    right breast cancer  . Open wound of knee, leg (except thigh), and ankle, without mention of complication 0/17/4944  . Osteoporosis   . Other malaise and fatigue 01/06/2012  . Other specified circulatory system disorders 10/09/2011  . Pain in joint, pelvic region and thigh 05/11/2012  . Pain in joint, site unspecified 02/03/39  . Phlebitis and  thrombophlebitis of other deep vessels of lower extremities 01/06/2012  . Right shoulder injury 12/2009  . Shoulder pain, right 10/2009   X- rayed at Dr. Berenice Primas.Severe Degenerative Disease. Recommended an artificial shoulder. Saw Dr. Tamera Punt .  Marland Kitchen Varicose vein 2014  . Varicose veins   . Vitamin D deficiency    Past Surgical History:  Procedure Laterality Date  . ABDOMINAL HYSTERECTOMY  1967  . CATARACT EXTRACTION  04/2004   Dr. Bing Plume  . colon polyps  1989, 1992, 1995, 1997  . ENDOVENOUS ABLATION SAPHENOUS VEIN W/ LASER  01-30-2012   left greater saphenous vein and sclerotherapy left leg  . FEMUR IM NAIL Right 07/02/2017   Procedure: INTRAMEDULLARY (IM) NAIL FEMORAL;  Surgeon: Gaynelle Arabian, MD;  Location: [facility];  Service: Orthopedics;  Laterality: Right;  . left cataract  1999  . MOLE REMOVAL  03/04/2009   on back by Dr. Derrel Nip  . right mastectomy  1989  . VESICOVAGINAL FISTULA CLOSURE W/ TAH  1967    reports that she quit smoking about 39 years ago. Her smoking use included cigarettes. She has a 10.00 pack-year smoking history. she has never used smokeless tobacco. She reports that she drinks alcohol. She reports that she does not use drugs. Social History   Socioeconomic History  . Marital status: Single    Spouse name: Not on file  . Number of children: Not on file  . Years of education: Not on file  . Highest education level: Not on file  Social Needs  . Financial resource strain: Not on file  . Food insecurity - worry: Not on file  . Food insecurity - inability: Not on file  . Transportation needs - medical: Not on file  . Transportation needs - non-medical: Not on file  Occupational History  . Not on file  Tobacco Use  . Smoking status: Former Smoker    Packs/day: 0.50    Years: 20.00    Pack years: 10.00    Types: Cigarettes    Last attempt to quit: 06/10/1978    Years since quitting: 39.1  . Smokeless tobacco: Never Used  Substance and Sexual Activity  .  Alcohol use: Yes    Alcohol/week: 0.0 - 0.6 oz    Comment: 8 oz of wine per day  . Drug use: No  . Sexual activity: Not on file  Other Topics Concern  . Not on file  Social History Narrative   Patient is Single.Occupation   Lives in single level home, Independent Living section at [facility] since [place], drinks  Alcohol     Patient has Advanced planning documents:  DNR, HCPOA   Exercise class 45 minutes 3 times a week   Walks with walker             Functional Status Survey:    Family History  Problem Relation Age of Onset  . Cancer  Mother        ? type    Health Maintenance  Topic Date Due  . Samul Dada  08/25/1941  . PNA vac Low Risk Adult (1 of 2 - PCV13) 08/24/1989  . INFLUENZA VACCINE  01/08/2017  . DEXA SCAN  Completed    Allergies  Allergen Reactions  . Levaquin [Levofloxacin In D5w] Diarrhea  . Mobic [Meloxicam]     Blood pressure goes up  . Tramadol     Blood pressure goes up     Outpatient Encounter Medications as of 07/08/2017  Medication Sig  . acetaminophen (TYLENOL) 325 MG tablet Take 650 mg by mouth every 6 (six) hours as needed for moderate pain.  Derrill Memo ON 07/15/2017] aspirin EC 81 MG tablet Take 81 mg by mouth daily.  . benzonatate (TESSALON PERLES) 100 MG capsule Take 100 mg by mouth at bedtime.   . carvedilol (COREG) 3.125 MG tablet Take 1 tablet (3.125 mg total) by mouth 2 (two) times daily with a meal.  . divalproex (DEPAKOTE SPRINKLE) 125 MG capsule Take 250 mg by mouth 2 (two) times daily.   Marland Kitchen enoxaparin (LOVENOX) 30 MG/0.3ML injection Inject 0.3 mLs (30 mg total) into the skin daily. Take injections for 10 days, then reduce to a baby Aspirin 81 mg daily for three additional weeks.  . furosemide (LASIX) 80 MG tablet Take 1 tablet (80 mg total) by mouth daily as needed for fluid. 80 mg in the morning  . HYDROcodone-acetaminophen (NORCO/VICODIN) 5-325 MG tablet Take 1 tablet by mouth every 6 (six)  hours as needed for moderate pain.  Marland Kitchen ipratropium-albuterol (DUONEB) 0.5-2.5 (3) MG/3ML SOLN Take 3 mLs by nebulization every 6 (six) hours as needed (wheezing and sob).   Marland Kitchen levothyroxine (SYNTHROID, LEVOTHROID) 25 MCG tablet Take 1 tablet by mouth daily.  . Melatonin 10 MG TABS Take 10 mg by mouth at bedtime.   . Menthol, Topical Analgesic, (BIOFREEZE) 4 % GEL Apply 1 application topically 2 (two) times daily as needed (shoulder pain).   Marland Kitchen menthol-cetylpyridinium (CEPACOL) 3 MG lozenge Take 1 lozenge by mouth as needed for sore throat.  . Nutritional Supplements (BOOST BREEZE PO) Take by mouth. One can twice daily  . potassium chloride SA (K-DUR,KLOR-CON) 20 MEQ tablet Take 40 mEq by mouth daily.  . sertraline (ZOLOFT) 100 MG tablet Take 1 tablet (100 mg total) by mouth daily.   No facility-administered encounter medications on file as of 07/08/2017.     Review of Systems  Constitutional: Positive for fatigue. Negative for activity change, appetite change and unexpected weight change.  HENT: Positive for hearing loss. Negative for congestion.   Eyes: Negative for visual disturbance.  Respiratory: Negative for chest tightness and shortness of breath.   Cardiovascular: Negative for chest pain, palpitations and leg swelling.  Gastrointestinal: Positive for constipation. Negative for abdominal pain, diarrhea and nausea.  Genitourinary: Negative for dysuria.       Urinary incontinence  Musculoskeletal: Positive for arthralgias and gait problem. Negative for back pain.       Right hip painful to move, ok at rest  Skin: Positive for pallor.  Neurological: Positive for weakness. Negative for dizziness.  Hematological: Bruises/bleeds easily.  Psychiatric/Behavioral: Positive for behavioral problems and confusion. Negative for sleep disturbance. The patient is not nervous/anxious.        Spreading rumors about other residents and saying they caused her left hand to be bruised; somewhat delusional  in this respect; sarcastic    Vitals:   07/08/17  0952  BP: (!) 120/50  Pulse: 98  Resp: 16  Temp: 98.8 F (37.1 C)  TempSrc: Oral  SpO2: 99%   There is no height or weight on file to calculate BMI. Physical Exam  Constitutional:  Frail white female in manual wheelchair  HENT:  Head: Normocephalic and atraumatic.  Right Ear: External ear normal.  Left Ear: External ear normal.  Nose: Nose normal.  Mouth/Throat: Oropharynx is clear and moist.  Eyes: Conjunctivae and EOM are normal. Pupils are equal, round, and reactive to light.  Skin:  Slight residual periorbital ecchymoses laterally; left dorsum of hand dark purple (pt blaming other residents and therapists for this, but then questioning herself about whether what she is saying is true); right hip with clean incision site from IM nail, no drainage, no significant swelling or warmth, mild tenderness  Psychiatric:  See comments above about behavior    Labs reviewed: Basic Metabolic Panel: Recent Labs    07/02/17 0602 07/03/17 0527 07/04/17 0510 07/07/17  NA 140 140 138 142  K 4.1 4.2 3.8 4.0  CL 105 104 103  --   CO2 26 28 28   --   GLUCOSE 136* 123* 102*  --   BUN 29* 30* 31* 25*  CREATININE 0.94 0.99 0.93 0.5  CALCIUM 8.7* 8.2* 8.2*  --    Liver Function Tests: Recent Labs    04/02/17 0700  AST 13  ALT 5*  ALKPHOS 57   No results for input(s): LIPASE, AMYLASE in the last 8760 hours. No results for input(s): AMMONIA in the last 8760 hours. CBC: Recent Labs    06/30/17 2056  07/02/17 0602 07/03/17 0527 07/04/17 0510 07/07/17  WBC 9.7   < > 10.4 10.7* 8.7 8.1  NEUTROABS 8.3*  --   --   --   --   --   HGB 11.4*   < > 9.1* 7.9* 9.1* 9.8*  HCT 34.4*   < > 27.5* 23.3* 26.5* 28*  MCV 88.4   < > 88.4 88.9 88.9  --   PLT 154   < > 126* 128* 116* 178   < > = values in this interval not displayed.   Cardiac Enzymes: Recent Labs    07/03/17 0822 07/03/17 1330 07/03/17 2032  TROPONINI 0.04* 0.04* 0.04*    BNP: Invalid input(s): POCBNP Lab Results  Component Value Date   HGBA1C 5.1 01/27/2014   Lab Results  Component Value Date   TSH 3.01 08/01/2016   No results found for: VITAMINB12 No results found for: FOLATE No results found for: IRON, TIBC, FERRITIN  Imaging and Procedures obtained prior to SNF admission: Dg Chest 2 View  Result Date: 06/30/2017 CLINICAL DATA:  Fall with hip pain, history of breast cancer EXAM: CHEST  2 VIEW COMPARISON:  12/14/2014, 08/24/2013 FINDINGS: Ground-glass opacity and consolidation in the right upper lobe. No pleural effusion. Cardiomegaly with aortic atherosclerosis. No pneumothorax. Severe chronic deformity of the right shoulder. Multiple old right-sided rib fractures IMPRESSION: 1. Ground-glass opacity and consolidation in the right upper lobe, possibly a pneumonia however imaging follow-up to resolution recommended to exclude underlying mass 2. Moderate severe cardiomegaly without edema Electronically Signed   By: Donavan Foil M.D.   On: 06/30/2017 20:43   Ct Head Wo Contrast  Result Date: 06/30/2017 CLINICAL DATA:  Head trauma unwitnessed fall EXAM: CT HEAD WITHOUT CONTRAST CT CERVICAL SPINE WITHOUT CONTRAST TECHNIQUE: Multidetector CT imaging of the head and cervical spine was performed following the standard protocol without  intravenous contrast. Multiplanar CT image reconstructions of the cervical spine were also generated. COMPARISON:  None. FINDINGS: CT HEAD FINDINGS Brain: No acute territorial infarction, hemorrhage, or intracranial mass is visualized. Moderate-to-marked atrophy. Mild to moderate subcortical and periventricular white matter hypodensity consistent with small vessel ischemic changes. Prominent ventricles, felt related to atrophy Vascular: No hyperdense vessels.  Carotid artery calcifications. Skull: No fracture or suspicious lesion Sinuses/Orbits: Mild mucosal thickening in the ethmoid and maxillary sinuses. No acute orbital  abnormality. Other: None CT CERVICAL SPINE FINDINGS Alignment: No subluxation.  Facet alignment is within normal limits. Skull base and vertebrae: No acute fracture. No primary bone lesion or focal pathologic process. Soft tissues and spinal canal: No prevertebral fluid or swelling. No visible canal hematoma. Disc levels: Moderate severe diffuse degenerative changes C3 through C7. Multiple level bilateral facet arthropathy with multilevel bilateral foraminal stenosis. Upper chest: Partially visualized ground-glass density in the right upper lobe. Mild apical emphysema. Aortic atherosclerosis. No thyroid mass Other: None IMPRESSION: 1. No CT evidence for acute intracranial abnormality. Atrophy and small vessel ischemic changes of the white matter 2. Degenerative changes of the cervical spine. No acute osseous abnormality 3. Mild apical emphysema. Partially visualized ground-glass density in the right upper lobe suspicious for pneumonia Electronically Signed   By: Donavan Foil M.D.   On: 06/30/2017 20:54   Ct Cervical Spine Wo Contrast  Result Date: 06/30/2017 CLINICAL DATA:  Head trauma unwitnessed fall EXAM: CT HEAD WITHOUT CONTRAST CT CERVICAL SPINE WITHOUT CONTRAST TECHNIQUE: Multidetector CT imaging of the head and cervical spine was performed following the standard protocol without intravenous contrast. Multiplanar CT image reconstructions of the cervical spine were also generated. COMPARISON:  None. FINDINGS: CT HEAD FINDINGS Brain: No acute territorial infarction, hemorrhage, or intracranial mass is visualized. Moderate-to-marked atrophy. Mild to moderate subcortical and periventricular white matter hypodensity consistent with small vessel ischemic changes. Prominent ventricles, felt related to atrophy Vascular: No hyperdense vessels.  Carotid artery calcifications. Skull: No fracture or suspicious lesion Sinuses/Orbits: Mild mucosal thickening in the ethmoid and maxillary sinuses. No acute orbital  abnormality. Other: None CT CERVICAL SPINE FINDINGS Alignment: No subluxation.  Facet alignment is within normal limits. Skull base and vertebrae: No acute fracture. No primary bone lesion or focal pathologic process. Soft tissues and spinal canal: No prevertebral fluid or swelling. No visible canal hematoma. Disc levels: Moderate severe diffuse degenerative changes C3 through C7. Multiple level bilateral facet arthropathy with multilevel bilateral foraminal stenosis. Upper chest: Partially visualized ground-glass density in the right upper lobe. Mild apical emphysema. Aortic atherosclerosis. No thyroid mass Other: None IMPRESSION: 1. No CT evidence for acute intracranial abnormality. Atrophy and small vessel ischemic changes of the white matter 2. Degenerative changes of the cervical spine. No acute osseous abnormality 3. Mild apical emphysema. Partially visualized ground-glass density in the right upper lobe suspicious for pneumonia Electronically Signed   By: Donavan Foil M.D.   On: 06/30/2017 20:54   Dg Hip Unilat With Pelvis 2-3 Views Right  Result Date: 06/30/2017 CLINICAL DATA:  Right hip pain due to a fall today. Initial encounter. EXAM: DG HIP (WITH OR WITHOUT PELVIS) 2-3V RIGHT COMPARISON:  None. FINDINGS: The patient has an acute right intertrochanteric fracture. No other acute bony or joint abnormality is seen. IMPRESSION: Acute right intertrochanteric fracture. Electronically Signed   By: Inge Rise M.D.   On: 06/30/2017 20:41    Assessment/Plan 1. Closed right hip fracture, initial encounter ([place]) -s/p IM nail by Dr. Wynelle Link -keep f/u appt  as scheduled 07/15/17 at 4:45pm -cont PT, OT  -complete lovenox and asa dvt prophylaxis as per orthopedics -has some pain with exercise, but not at rest--use tylenol first and only, if not relieved, use norco for max acetaminophen per day of 3000mg   2. Senile osteoporosis -known osteoporosis, pt had been on hospice care for her chf and lung mass  so aggressive intervention like prolia not pursued, no lpnger on ca with D and additional D either  3. Opacity of lung on imaging study -chronic, had been felt to likely be malignancy and pt and her POA had opted not to work this up, had hospice care, but graduated due to stability just recently  4. Vascular dementia with behavioral disturbance -recently with more behavioral issues and we have involved geriatric psychiatry to help due to resident striking other residents and staff and being inappropriate -she is spreading rumors about other residents when I saw her -she remains on zoloft for depression, depakote sprinkles for mood stability  5. Chronic combined systolic and diastolic CHF (congestive heart failure) ([place]) -has been stable for a couple of years now with her current regimen -currently losartan held due to soft bp and lasix made prn from scheduled -bmp ok today, but will recheck in 1 week -will monitor for SOB and edema--currently neither present at my visit  6. Hypothyroidism (acquired) -has been stable on low dose of levothyroxine Lab Results  Component Value Date   TSH 3.01 08/01/2016    7.  Acute blood loss anemia:  hgb up to 9.8 as of yesterday, recheck again in 1 week with her bmp  Family/ staff Communication: discussed with rehab nursing and nurse manager  Labs/tests ordered:  Cbc, bmp in one more week (due to diuretic changed to prn, ARB stopped with soft bp and potassium continued)  Marlyne Totaro L. Alif Petrak, D.O. [facility] [address], [place] [address] Cell Phone (Mon-Fri 8am-5pm):  [phone] On Call:  [phone] & follow prompts after 5pm & weekends Office Phone:  [phone] Office Fax:  [phone]

## 2017-07-14 LAB — CBC AND DIFFERENTIAL
HCT: 28 — AB (ref 36–46)
Hemoglobin: 9.2 — AB (ref 12.0–16.0)
Platelets: 255 (ref 150–399)
WBC: 8.6

## 2017-07-14 LAB — BASIC METABOLIC PANEL
BUN: 12 (ref 4–21)
Creatinine: 0.6 (ref 0.5–1.1)
Glucose: 87
Potassium: 4.3 (ref 3.4–5.3)
Sodium: 139 (ref 137–147)

## 2017-07-14 LAB — HEMOGLOBIN A1C: Hemoglobin A1C: 5.6

## 2017-07-25 ENCOUNTER — Non-Acute Institutional Stay (SKILLED_NURSING_FACILITY): Payer: Medicare Other | Admitting: Adult Health

## 2017-07-25 ENCOUNTER — Encounter: Payer: Self-pay | Admitting: Adult Health

## 2017-07-25 DIAGNOSIS — L03115 Cellulitis of right lower limb: Secondary | ICD-10-CM | POA: Diagnosis not present

## 2017-07-25 DIAGNOSIS — K5901 Slow transit constipation: Secondary | ICD-10-CM

## 2017-07-25 NOTE — Progress Notes (Signed)
Location:  [facility]up[facility] psychologist of Service:  SNF (31) Provider:   Cindi Carbon, ANP [place] [phone]   Tara Curry, DO  Patient Care Team: Tara Curry, DO as PCP - General (Geriatric Medicine) [name], MD (Orthopedic Surgery) [name], MD (Ophthalmology) [facility] Arlyn Dunning, MD as Consulting Physician (Cardiology)  Extended Emergency Contact Information Primary Emergency Contact: Tara Garrett Address: [address]          [name] [address] [name] Phone: [phone] Mobile Phone: [phone] Relation: Friend  Code Status:  DNR Goals of care: Advanced Directive information Advanced Directives 07/08/2017  Does Patient Have a Medical Advance Directive? Yes  Type of Advance Directive Out of facility DNR (pink MOST or yellow form);[place];Living will  Does patient want to make changes to medical advance directive? No - Patient declined  Copy of Tara Garrett in Chart? Yes  Pre-existing out of facility DNR order (yellow form or pink MOST form) Yellow form placed in chart (order not valid for inpatient use)     Chief Complaint  Patient presents with  . Acute Visit    right heel pain and redness    HPI:  Pt is a 84 y.o. female seen today for an acute visit for right heel pain and redness. Also reports of constipation She is here in the rehab unit due to a fall, right hip fracture with subsequent right IM nailing.  The nurse reports there was an area of pressure injury that was boggy when she arrived from the hospital on 1/26.  The staff has been monitoring the area and applying skin prep and a dressing. The nurse noticed for the past couple days there was pain, swelling, redness, and small amts of green drainage. There have been no fever, malaise, or decrease in appetite. Tara Garrett also has had issues with constipation and required a  suppository to have a BM one day ago.   Past Medical History:  Diagnosis Date  . Closed fracture of two ribs 10/21/2011, 08/24/2013  . Closed fracture of unspecified part of upper end of humerus 01/13/2005  . Fall 10-09-2011   fell outdoors on driveway fell and hit right side of body and face  . Female stress incontinence 02/10/2004  . Gait disorder 08/25/2013   Poor balance   . Herpes zoster without mention of complication 03/50/0938  . Hyperglycemia   . Hyperlipidemia   . Hypertension   . Hypothyroidism   . Keratosis seborrheica   . Left knee pain   . Leg pain   . Macular degeneration   . Malignant neoplasm of breast (female), unspecified site   . Multiple fractures of ribs of left side 08/24/2013  . Neoplasm of breast    right breast cancer  . Open wound of knee, leg (except thigh), and ankle, without mention of complication 1/82/9937  . Osteoporosis   . Other malaise and fatigue 01/06/2012  . Other specified circulatory system disorders 10/09/2011  . Pain in joint, pelvic region and thigh 05/11/2012  . Pain in joint, site unspecified 02/03/39  . Phlebitis and thrombophlebitis of other deep vessels of lower extremities 01/06/2012  . Right shoulder injury 12/2009  . Shoulder pain, right 10/2009   X- rayed at Dr. Berenice Primas.Severe Degenerative Disease. Recommended an artificial shoulder. Saw Dr. Tamera Punt .  Marland Kitchen Varicose vein 2014  . Varicose veins   . Vitamin D deficiency    Past  Surgical History:  Procedure Laterality Date  . ABDOMINAL HYSTERECTOMY  1967  . CATARACT EXTRACTION  04/2004   Dr. Bing Plume  . colon polyps  1989, 1992, 1995, 1997  . ENDOVENOUS ABLATION SAPHENOUS VEIN W/ LASER  01-30-2012   left greater saphenous vein and sclerotherapy left leg  . FEMUR IM NAIL Right 07/02/2017   Procedure: INTRAMEDULLARY (IM) NAIL FEMORAL;  Surgeon: Gaynelle Arabian, MD;  Location: [facility];  Service: Orthopedics;  Laterality: Right;  . left cataract  1999  . MOLE REMOVAL  03/04/2009   on back by Dr.  Derrel Nip  . right mastectomy  1989  . VESICOVAGINAL FISTULA CLOSURE W/ TAH  1967    Allergies  Allergen Reactions  . Levaquin [Levofloxacin In D5w] Diarrhea  . Mobic [Meloxicam]     Blood pressure goes up  . Tramadol     Blood pressure goes up     Outpatient Encounter Medications as of 07/25/2017  Medication Sig  . acetaminophen (TYLENOL) 325 MG tablet Take 650 mg by mouth every 6 (six) hours as needed for moderate pain.  Marland Kitchen aspirin EC 81 MG tablet Take 81 mg by mouth daily.  . benzonatate (TESSALON PERLES) 100 MG capsule Take 100 mg by mouth at bedtime.   . carvedilol (COREG) 3.125 MG tablet Take 1 tablet (3.125 mg total) by mouth 2 (two) times daily with a meal.  . divalproex (DEPAKOTE SPRINKLE) 125 MG capsule Take 250 mg by mouth 2 (two) times daily.   Marland Kitchen enoxaparin (LOVENOX) 30 MG/0.3ML injection Inject 0.3 mLs (30 mg total) into the skin daily. Take injections for 10 days, then reduce to a baby Aspirin 81 mg daily for three additional weeks.  . furosemide (LASIX) 80 MG tablet Take 1 tablet (80 mg total) by mouth daily as needed for fluid. 80 mg in the morning  . HYDROcodone-acetaminophen (NORCO/VICODIN) 5-325 MG tablet Take 1 tablet by mouth every 6 (six) hours as needed for moderate pain.  Marland Kitchen ipratropium-albuterol (DUONEB) 0.5-2.5 (3) MG/3ML SOLN Take 3 mLs by nebulization every 6 (six) hours as needed (wheezing and sob).   Marland Kitchen levothyroxine (SYNTHROID, LEVOTHROID) 25 MCG tablet Take 1 tablet by mouth daily.  . Melatonin 10 MG TABS Take 10 mg by mouth at bedtime.   . Menthol, Topical Analgesic, (BIOFREEZE) 4 % GEL Apply 1 application topically 2 (two) times daily as needed (shoulder pain).   Marland Kitchen menthol-cetylpyridinium (CEPACOL) 3 MG lozenge Take 1 lozenge by mouth as needed for sore throat.  . Nutritional Supplements (BOOST BREEZE PO) Take by mouth. One can twice daily  . potassium chloride SA (K-DUR,KLOR-CON) 20 MEQ tablet Take 40 mEq by mouth daily.  . sertraline (ZOLOFT) 100 MG  tablet Take 1 tablet (100 mg total) by mouth daily.   No facility-administered encounter medications on file as of 07/25/2017.     Review of Systems  Constitutional: Positive for activity change. Negative for appetite change, chills, diaphoresis, fatigue, fever and unexpected weight change.  HENT: Negative for congestion.   Respiratory: Negative for cough, shortness of breath and wheezing.   Cardiovascular: Positive for leg swelling. Negative for chest pain and palpitations.  Gastrointestinal: Positive for diarrhea. Negative for abdominal distention, abdominal pain, nausea and vomiting.  Musculoskeletal: Positive for arthralgias and gait problem.  Skin: Positive for color change and wound.  Psychiatric/Behavioral: Positive for confusion.    Immunization History  Administered Date(s) Administered  . Influenza Whole 03/10/2012  . Influenza-Unspecified 03/28/2016   Pertinent  Health Maintenance Due  Topic Date  Due  . PNA vac Low Risk Adult (1 of 2 - PCV13) 08/24/1989  . INFLUENZA VACCINE  01/08/2017  . DEXA SCAN  Completed   Fall Risk  05/28/2017 04/02/2017 07/10/2016 09/27/2015 04/11/2014  Falls in the past year? No No No No Yes  Number falls in past yr: - - - - 1  Injury with Fall? - - - - Yes  Comment - - - - Broken rib    Functional Status Survey:    Vitals:   07/25/17 1533  BP: 133/72  Pulse: 79  Resp: 20  Temp: 98.3 F (36.8 C)   There is no height or weight on file to calculate BMI. Physical Exam  Constitutional: She is oriented to person, place, and time. No distress.  HENT:  Head: Normocephalic and atraumatic.  Neck: No JVD present.  Cardiovascular: Normal rate and regular rhythm.  No murmur heard. Edema to right ankle +1  Pulmonary/Chest: Effort normal and breath sounds normal. No respiratory distress. She has no wheezes.  Abdominal: Soft. Bowel sounds are normal. She exhibits no distension.  Lymphadenopathy:    She has no cervical adenopathy.    Neurological: She is alert and oriented to person, place, and time.  Mild confusion about the details of her care  Skin: Skin is warm and dry. She is not diaphoretic.  Right heel with pressure injury, not able to stage.  Tender to palpation, black eschar tissue noted 4.5 cm x 3.5 cm.  Surrounding erythema to the wound but does not spread up the foot or leg area. Small amt of green drainage on dressing.  Psychiatric: She has a normal mood and affect.  Vitals reviewed.   Labs reviewed: Recent Labs    07/02/17 0602 07/03/17 0527 07/04/17 0510 07/07/17  NA 140 140 138 142  K 4.1 4.2 3.8 4.0  CL 105 104 103  --   CO2 26 28 28   --   GLUCOSE 136* 123* 102*  --   BUN 29* 30* 31* 25*  CREATININE 0.94 0.99 0.93 0.5  CALCIUM 8.7* 8.2* 8.2*  --    Recent Labs    04/02/17 0700  AST 13  ALT 5*  ALKPHOS 57   Recent Labs    06/30/17 2056  07/02/17 0602 07/03/17 0527 07/04/17 0510 07/07/17  WBC 9.7   < > 10.4 10.7* 8.7 8.1  NEUTROABS 8.3*  --   --   --   --   --   HGB 11.4*   < > 9.1* 7.9* 9.1* 9.8*  HCT 34.4*   < > 27.5* 23.3* 26.5* 28*  MCV 88.4   < > 88.4 88.9 88.9  --   PLT 154   < > 126* 128* 116* 178   < > = values in this interval not displayed.   Lab Results  Component Value Date   TSH 3.01 08/01/2016   Lab Results  Component Value Date   HGBA1C 5.1 01/27/2014   Lab Results  Component Value Date   CHOL 178 01/27/2014   HDL 93 (A) 01/27/2014   LDLCALC 74 01/27/2014   TRIG 41 01/27/2014    Significant Diagnostic Results in last 30 days:  Dg Chest 2 View  Result Date: 06/30/2017 CLINICAL DATA:  Fall with hip pain, history of breast cancer EXAM: CHEST  2 VIEW COMPARISON:  12/14/2014, 08/24/2013 FINDINGS: Ground-glass opacity and consolidation in the right upper lobe. No pleural effusion. Cardiomegaly with aortic atherosclerosis. No pneumothorax. Severe chronic deformity of the right shoulder.  Multiple old right-sided rib fractures IMPRESSION: 1. Ground-glass  opacity and consolidation in the right upper lobe, possibly a pneumonia however imaging follow-up to resolution recommended to exclude underlying mass 2. Moderate severe cardiomegaly without edema Electronically Signed   By: Donavan Foil M.D.   On: 06/30/2017 20:43   Ct Head Wo Contrast  Result Date: 06/30/2017 CLINICAL DATA:  Head trauma unwitnessed fall EXAM: CT HEAD WITHOUT CONTRAST CT CERVICAL SPINE WITHOUT CONTRAST TECHNIQUE: Multidetector CT imaging of the head and cervical spine was performed following the standard protocol without intravenous contrast. Multiplanar CT image reconstructions of the cervical spine were also generated. COMPARISON:  None. FINDINGS: CT HEAD FINDINGS Brain: No acute territorial infarction, hemorrhage, or intracranial mass is visualized. Moderate-to-marked atrophy. Mild to moderate subcortical and periventricular white matter hypodensity consistent with small vessel ischemic changes. Prominent ventricles, felt related to atrophy Vascular: No hyperdense vessels.  Carotid artery calcifications. Skull: No fracture or suspicious lesion Sinuses/Orbits: Mild mucosal thickening in the ethmoid and maxillary sinuses. No acute orbital abnormality. Other: None CT CERVICAL SPINE FINDINGS Alignment: No subluxation.  Facet alignment is within normal limits. Skull base and vertebrae: No acute fracture. No primary bone lesion or focal pathologic process. Soft tissues and spinal canal: No prevertebral fluid or swelling. No visible canal hematoma. Disc levels: Moderate severe diffuse degenerative changes C3 through C7. Multiple level bilateral facet arthropathy with multilevel bilateral foraminal stenosis. Upper chest: Partially visualized ground-glass density in the right upper lobe. Mild apical emphysema. Aortic atherosclerosis. No thyroid mass Other: None IMPRESSION: 1. No CT evidence for acute intracranial abnormality. Atrophy and small vessel ischemic changes of the white matter 2.  Degenerative changes of the cervical spine. No acute osseous abnormality 3. Mild apical emphysema. Partially visualized ground-glass density in the right upper lobe suspicious for pneumonia Electronically Signed   By: Donavan Foil M.D.   On: 06/30/2017 20:54   Ct Cervical Spine Wo Contrast  Result Date: 06/30/2017 CLINICAL DATA:  Head trauma unwitnessed fall EXAM: CT HEAD WITHOUT CONTRAST CT CERVICAL SPINE WITHOUT CONTRAST TECHNIQUE: Multidetector CT imaging of the head and cervical spine was performed following the standard protocol without intravenous contrast. Multiplanar CT image reconstructions of the cervical spine were also generated. COMPARISON:  None. FINDINGS: CT HEAD FINDINGS Brain: No acute territorial infarction, hemorrhage, or intracranial mass is visualized. Moderate-to-marked atrophy. Mild to moderate subcortical and periventricular white matter hypodensity consistent with small vessel ischemic changes. Prominent ventricles, felt related to atrophy Vascular: No hyperdense vessels.  Carotid artery calcifications. Skull: No fracture or suspicious lesion Sinuses/Orbits: Mild mucosal thickening in the ethmoid and maxillary sinuses. No acute orbital abnormality. Other: None CT CERVICAL SPINE FINDINGS Alignment: No subluxation.  Facet alignment is within normal limits. Skull base and vertebrae: No acute fracture. No primary bone lesion or focal pathologic process. Soft tissues and spinal canal: No prevertebral fluid or swelling. No visible canal hematoma. Disc levels: Moderate severe diffuse degenerative changes C3 through C7. Multiple level bilateral facet arthropathy with multilevel bilateral foraminal stenosis. Upper chest: Partially visualized ground-glass density in the right upper lobe. Mild apical emphysema. Aortic atherosclerosis. No thyroid mass Other: None IMPRESSION: 1. No CT evidence for acute intracranial abnormality. Atrophy and small vessel ischemic changes of the white matter 2.  Degenerative changes of the cervical spine. No acute osseous abnormality 3. Mild apical emphysema. Partially visualized ground-glass density in the right upper lobe suspicious for pneumonia Electronically Signed   By: Donavan Foil M.D.   On: 06/30/2017 20:54   Dg  C-arm 1-60 Min-no Report  Result Date: 07/02/2017 CLINICAL DATA:  84 year old female with a history of surgical fixation EXAM: RIGHT FEMUR PORTABLE 2 VIEW; DG C-ARM 1-60 MIN-NO REPORT COMPARISON:  06/30/2017 FINDINGS: Limited intraoperative fluoroscopic spot images of the right hip demonstrate anterior intramedullary rod placement with single proximal cannulated screw at the femoral neck. Single distal interlocking screw. No immediate complicating features. IMPRESSION: Limited intraoperative fluoroscopic spot images of right hip fracture fixation, as above. Please refer to the dictated operative report for full details of intraoperative findings and procedure. Electronically Signed   By: Corrie Mckusick D.O.   On: 07/02/2017 20:05   Dg Hip Unilat With Pelvis 2-3 Views Right  Result Date: 06/30/2017 CLINICAL DATA:  Right hip pain due to a fall today. Initial encounter. EXAM: DG HIP (WITH OR WITHOUT PELVIS) 2-3V RIGHT COMPARISON:  None. FINDINGS: The patient has an acute right intertrochanteric fracture. No other acute bony or joint abnormality is seen. IMPRESSION: Acute right intertrochanteric fracture. Electronically Signed   By: Inge Rise M.D.   On: 06/30/2017 20:41   Dg Femur Port, Min 2 Views Right  Result Date: 07/02/2017 CLINICAL DATA:  84 year old female with a history of surgical fixation EXAM: RIGHT FEMUR PORTABLE 2 VIEW; DG C-ARM 1-60 MIN-NO REPORT COMPARISON:  06/30/2017 FINDINGS: Limited intraoperative fluoroscopic spot images of the right hip demonstrate anterior intramedullary rod placement with single proximal cannulated screw at the femoral neck. Single distal interlocking screw. No immediate complicating features.  IMPRESSION: Limited intraoperative fluoroscopic spot images of right hip fracture fixation, as above. Please refer to the dictated operative report for full details of intraoperative findings and procedure. Electronically Signed   By: Corrie Mckusick D.O.   On: 07/02/2017 20:05    Assessment/Plan  Right heel cellulitis Bactrim DS 1 tab BID for 7 days Mark area of redness and monitor Avoid pressure to the area and apply skin prep daily, cover to contain drainage  Constipation Colace 100 mg BID  I discussed the following plan with her nurse, Manuela Schwartz.  Cindi Carbon, ANP [facility] [phone]

## 2017-08-06 ENCOUNTER — Encounter: Payer: Self-pay | Admitting: Internal Medicine

## 2017-08-21 ENCOUNTER — Non-Acute Institutional Stay (SKILLED_NURSING_FACILITY): Payer: Medicare Other | Admitting: Adult Health

## 2017-08-21 ENCOUNTER — Encounter: Payer: Self-pay | Admitting: Adult Health

## 2017-08-21 DIAGNOSIS — F01518 Vascular dementia, unspecified severity, with other behavioral disturbance: Secondary | ICD-10-CM

## 2017-08-21 DIAGNOSIS — S72001D Fracture of unspecified part of neck of right femur, subsequent encounter for closed fracture with routine healing: Secondary | ICD-10-CM | POA: Diagnosis not present

## 2017-08-21 DIAGNOSIS — F0151 Vascular dementia with behavioral disturbance: Secondary | ICD-10-CM | POA: Diagnosis not present

## 2017-08-21 DIAGNOSIS — L89612 Pressure ulcer of right heel, stage 2: Secondary | ICD-10-CM | POA: Diagnosis not present

## 2017-08-21 DIAGNOSIS — D62 Acute posthemorrhagic anemia: Secondary | ICD-10-CM | POA: Diagnosis not present

## 2017-08-21 DIAGNOSIS — I5042 Chronic combined systolic (congestive) and diastolic (congestive) heart failure: Secondary | ICD-10-CM | POA: Diagnosis not present

## 2017-08-21 NOTE — Progress Notes (Signed)
Location:  [facility] Room Number: 127 Place of Service:  SNF [phone]) Provider:  Algis Greenhouse, AGNP-Student/Christy Bradshaw Minihan, ANP-BC  [name], DO  Patient Care Team: [name], DO as PCP - General (Geriatric Medicine) [name], MD (Orthopedic Surgery) [name], MD (Ophthalmology) [facility], [facility] Arlyn Dunning, MD as Consulting Physician (Cardiology)  Extended Emergency Contact Information Primary Emergency Contact: [name],B J Address: [address]          [name] [address] [name] of [place] Phone: [phone] Mobile Phone: [phone] Relation: Friend  Code Status:  DNR Goals of care: Advanced Directive information Advanced Directives 07/08/2017  Does Patient Have a Medical Advance Directive? Yes  Type of Advance Directive Out of facility DNR (pink MOST or yellow form);[place];Living will  Does patient want to make changes to medical advance directive? No - Patient declined  Copy of [place] in Chart? Yes  Pre-existing out of facility DNR order (yellow form or pink MOST form) Yellow form placed in chart (order not valid for inpatient use)     Chief Complaint  Patient presents with  . Medical Management of Chronic Issues    HPI:  Pt is a 84 y.o. female seen today for medical management of chronic diseases.    Pt was seen in FU for her right, closed hip fracture that occurred in 06/2016. She received a IM nailing. Returned from the hospital to rehab at [facility] where she subsequently refused to work well with PT and as a result is now lift/wheelchair bound and transitioned to SNF. According to orthopedic FU visit on 08/12/17, the hip hardware is in good position with early callus forming. Ortho plans to FU in 6 weeks. Pt denies pain in the hip.   Following hospitalization, pt noted to have pressure injury on posterior right heel with subsequent  infection. Tx with Bactrim DS x 7 days and dressing changes daily with Santyl and dry gauze. Pt denies pain to the area unless it is touched. She states her pain has been well controlled with PRN Tylenol and Hydrocodone-acetaminophen. Pt has used each PRN 6x in 2 weeks. She denies pain at this time. She also states she feels the wound is healing, but slowly.   Pt also seen to evaluate her chronic systolic/diastolic CHF. In 12/18 her weight was 97.1 lbs. Weights have continued to downtrend over the past three months and her most recent weight was 89.8 lbs. Pt has had poor PO intake and is refusing Boost. She has not been having to take the 20 mg Lasix PRN she was d/c from the hospital with. Prior to hospitalization, she was requiring 40 mg Lasix daily. She denies chest pain, SOB, or swelling of BLE.   During hospitalization in 1/19, she required 1 U PRBCs following surgical repair of R Hip. When Hgb was rechecked on 1/28, her Hgb was 9.8. Pt denies dizziness or lightheadedness.   Pt has continual behavioral disturbances with her vascular dementia. 2/19 Depakote Sprinkles were increased to 375 mg in the AM and 250 mg in the evening. Per staff report pt's behavior vary drastically throughout the day. Today she was noted to be angry and agitated during breakfast, but pleasant by lunchtime. Pt is unaware of behavioral disturbances noting she gets irritated because staff does not know what they are doing.     Past Medical History:  Diagnosis Date  . Closed fracture of two ribs 10/21/2011, 08/24/2013  . Closed  fracture of unspecified part of upper end of humerus 01/13/2005  . Fall 10-09-2011   fell outdoors on driveway fell and hit right side of body and face  . Female stress incontinence 02/10/2004  . Gait disorder 08/25/2013   Poor balance   . Herpes zoster without mention of complication 17/40/8144  . Hyperglycemia   . Hyperlipidemia   . Hypertension   . Hypothyroidism   . Keratosis seborrheica   .  Left knee pain   . Leg pain   . Macular degeneration   . Malignant neoplasm of breast (female), unspecified site   . Multiple fractures of ribs of left side 08/24/2013  . Neoplasm of breast    right breast cancer  . Open wound of knee, leg (except thigh), and ankle, without mention of complication 01/25/5630  . Osteoporosis   . Other malaise and fatigue 01/06/2012  . Other specified circulatory system disorders 10/09/2011  . Pain in joint, pelvic region and thigh 05/11/2012  . Pain in joint, site unspecified 02/03/39  . Phlebitis and thrombophlebitis of other deep vessels of lower extremities 01/06/2012  . Right shoulder injury 12/2009  . Shoulder pain, right 10/2009   X- rayed at Dr. Berenice Primas.Severe Degenerative Disease. Recommended an artificial shoulder. Saw Dr. Tamera Punt .  Marland Kitchen Varicose vein 2014  . Varicose veins   . Vitamin D deficiency    Past Surgical History:  Procedure Laterality Date  . ABDOMINAL HYSTERECTOMY  1967  . CATARACT EXTRACTION  04/2004   Dr. Bing Plume  . colon polyps  1989, 1992, 1995, 1997  . ENDOVENOUS ABLATION SAPHENOUS VEIN W/ LASER  01-30-2012   left greater saphenous vein and sclerotherapy left leg  . FEMUR IM NAIL Right 07/02/2017   Procedure: INTRAMEDULLARY (IM) NAIL FEMORAL;  Surgeon: Gaynelle Arabian, MD;  Location: [facility];  Service: Orthopedics;  Laterality: Right;  . left cataract  1999  . MOLE REMOVAL  03/04/2009   on back by Dr. Derrel Nip  . right mastectomy  1989  . VESICOVAGINAL FISTULA CLOSURE W/ TAH  1967    Allergies  Allergen Reactions  . Levaquin [Levofloxacin In D5w] Diarrhea  . Mobic [Meloxicam]     Blood pressure goes up  . Tramadol     Blood pressure goes up     Outpatient Encounter Medications as of 08/21/2017  Medication Sig  . acetaminophen (TYLENOL) 325 MG tablet Take 650 mg by mouth every 6 (six) hours as needed for moderate pain.  . carvedilol (COREG) 3.125 MG tablet Take 1 tablet (3.125 mg total) by mouth 2 (two) times daily with a  meal.  . divalproex (DEPAKOTE SPRINKLE) 125 MG capsule Take 375 mg by mouth daily. Every morning.  . divalproex (DEPAKOTE SPRINKLE) 125 MG capsule Take 250 mg by mouth every evening. Every evening.   . furosemide (LASIX) 40 MG tablet Take 40 mg by mouth daily as needed.  Marland Kitchen HYDROcodone-acetaminophen (NORCO/VICODIN) 5-325 MG tablet Take 1 tablet by mouth every 6 (six) hours as needed for moderate pain.  Marland Kitchen ipratropium-albuterol (DUONEB) 0.5-2.5 (3) MG/3ML SOLN Take 3 mLs by nebulization every 6 (six) hours as needed (wheezing and sob).   Marland Kitchen levothyroxine (SYNTHROID, LEVOTHROID) 25 MCG tablet Take 1 tablet by mouth daily.  . Menthol, Topical Analgesic, (BIOFREEZE) 4 % GEL Apply 1 application topically 2 (two) times daily as needed (shoulder pain).   Marland Kitchen menthol-cetylpyridinium (CEPACOL) 3 MG lozenge Take 1 lozenge by mouth as needed for sore throat.  . Nutritional Supplements (BOOST BREEZE PO) Take by  mouth. One can twice daily  . potassium chloride SA (K-DUR,KLOR-CON) 20 MEQ tablet Take 40 mEq by mouth daily.  . sertraline (ZOLOFT) 100 MG tablet Take 1 tablet (100 mg total) by mouth daily.  . [DISCONTINUED] furosemide (LASIX) 80 MG tablet Take 1 tablet (80 mg total) by mouth daily as needed for fluid. 80 mg in the morning  . benzonatate (TESSALON PERLES) 100 MG capsule Take 100 mg by mouth at bedtime.   . Melatonin 10 MG TABS Take 10 mg by mouth at bedtime.   . [DISCONTINUED] enoxaparin (LOVENOX) 30 MG/0.3ML injection Inject 0.3 mLs (30 mg total) into the skin daily. Take injections for 10 days, then reduce to a baby Aspirin 81 mg daily for three additional weeks.   No facility-administered encounter medications on file as of 08/21/2017.     Review of Systems  Constitutional: Positive for activity change, appetite change and unexpected weight change.       Decreased activity since hip fracture & refusal to work with PT.    HENT: Negative.   Respiratory: Negative.  Negative for cough and shortness  of breath.   Cardiovascular: Negative.  Negative for chest pain, palpitations and leg swelling.  Gastrointestinal: Negative.  Negative for constipation, diarrhea, nausea and vomiting.  Genitourinary: Negative for dysuria, frequency, hematuria and urgency.  Skin: Positive for color change and wound.       R Heel wound.   Neurological: Negative for dizziness, syncope and light-headedness.  Psychiatric/Behavioral: Positive for agitation, behavioral problems and confusion.       With staff and caregivers.    All other systems reviewed and are negative.   Immunization History  Administered Date(s) Administered  . Influenza Whole 03/10/2012  . Influenza-Unspecified 03/28/2016   Pertinent  Health Maintenance Due  Topic Date Due  . PNA vac Low Risk Adult (1 of 2 - PCV13) 08/24/1989  . INFLUENZA VACCINE  01/08/2017  . DEXA SCAN  Completed   Fall Risk  05/28/2017 04/02/2017 07/10/2016 09/27/2015 04/11/2014  Falls in the past year? No No No No Yes  Number falls in past yr: - - - - 1  Injury with Fall? - - - - Yes  Comment - - - - Broken rib    Functional Status Survey:    Vitals:   08/21/17 1324  BP: 130/77  Pulse: 69  Resp: 18  Temp: 97.9 F (36.6 C)  SpO2: 97%  Weight: 89 lb 12.8 oz (40.7 kg)   Body mass index is 19.43 kg/m. Physical Exam  Constitutional: Vital signs are normal. She is cooperative.  8 lb weight loss noted over 3 months.   Cardiovascular: Normal rate, regular rhythm, normal heart sounds and intact distal pulses.  Pulmonary/Chest: Effort normal and breath sounds normal.  Abdominal: Soft. Normal appearance and bowel sounds are normal. There is no tenderness.  Musculoskeletal:       Right hip: She exhibits decreased range of motion and decreased strength.  Neurological: She is alert.  Skin: Skin is warm and dry.     Psychiatric: Her speech is normal and behavior is normal. Her affect is labile. Cognition and memory are impaired.  Nursing note and vitals  reviewed.   Labs reviewed: Recent Labs    07/02/17 0602 07/03/17 0527 07/04/17 0510 07/07/17  NA 140 140 138 142  K 4.1 4.2 3.8 4.0  CL 105 104 103  --   CO2 26 28 28   --   GLUCOSE 136* 123* 102*  --  BUN 29* 30* 31* 25*  CREATININE 0.94 0.99 0.93 0.5  CALCIUM 8.7* 8.2* 8.2*  --    Recent Labs    04/02/17 0700  AST 13  ALT 5*  ALKPHOS 57   Recent Labs    06/30/17 2056  07/02/17 0602 07/03/17 0527 07/04/17 0510 07/07/17  WBC 9.7   < > 10.4 10.7* 8.7 8.1  NEUTROABS 8.3*  --   --   --   --   --   HGB 11.4*   < > 9.1* 7.9* 9.1* 9.8*  HCT 34.4*   < > 27.5* 23.3* 26.5* 28*  MCV 88.4   < > 88.4 88.9 88.9  --   PLT 154   < > 126* 128* 116* 178   < > = values in this interval not displayed.   Lab Results  Component Value Date   TSH 3.01 08/01/2016   Lab Results  Component Value Date   HGBA1C 5.1 01/27/2014   Lab Results  Component Value Date   CHOL 178 01/27/2014   HDL 93 (A) 01/27/2014   LDLCALC 74 01/27/2014   TRIG 41 01/27/2014    Lab Results  Component Value Date   TSH 3.01 08/01/2016    Significant Diagnostic Results in last 30 days:    Assessment/Plan 1. Vascular dementia with behavioral disturbance The issues the staff are reporting with her behavior are long standing and not likely to improve. She is not a danger to her self or others at this time. Continue Depakote Sprinkles as prescribed  Depakote level due in May 2019   2. Closed fracture of right hip with routine healing, subsequent encounter Pain has improved but due to her dementia she has not progressed in mobility. FU in 6 weeks with Ortho as scheduled   3. Pressure injury of right heel, stage 2 Continue Sanytl dsg change daily   4. Acute blood loss anemia CBC in one month to monitor.   5. Chronic combined systolic and diastolic CHF (congestive heart failure) (HCC) Monitor weights  Monitor frequency of use of Lasix 20 mg PRN Monitor signs of fluid overload  Pt refused  Influenza, Pneumococcal, and TDap vaccination in 2018.   Family/ staff Communication: LPN  Labs/tests ordered:  n/a

## 2017-08-26 ENCOUNTER — Encounter: Payer: Self-pay | Admitting: Internal Medicine

## 2017-08-26 ENCOUNTER — Non-Acute Institutional Stay (SKILLED_NURSING_FACILITY): Payer: Medicare Other | Admitting: Internal Medicine

## 2017-08-26 DIAGNOSIS — D62 Acute posthemorrhagic anemia: Secondary | ICD-10-CM | POA: Diagnosis not present

## 2017-08-26 DIAGNOSIS — K921 Melena: Secondary | ICD-10-CM | POA: Diagnosis not present

## 2017-08-26 NOTE — Progress Notes (Signed)
Patient ID: Tara Garrett, female   DOB: 1922/11/05, 84 y.o.   MRN: [id number]  Location:  [facility] Room Number: 127 Place of Service:  SNF [phone]) Provider:  Karen Kays, RN, [place], DNP Student/ [name], DO  Patient Care Team: [name], DO as PCP - General (Geriatric Medicine) [name], MD (Orthopedic Surgery) [name], MD (Ophthalmology) [facility], [facility] Arlyn Dunning, MD as Consulting Physician (Cardiology)  Extended Emergency Contact Information Primary Emergency Contact: [name],B J Address: [address]          [name] [address] [name] of [place] Phone: [phone] Mobile Phone: [phone] Relation: Friend  Code Status:  DNR  Goals of care: Advanced Directive information Advanced Directives 08/26/2017  Does Patient Have a Medical Advance Directive? Yes  Type of Advance Directive Out of facility DNR (pink MOST or yellow form);[place];Living will  Does patient want to make changes to medical advance directive? No - Patient declined  Copy of [place] in Chart? Yes  Pre-existing out of facility DNR order (yellow form or pink MOST form) Yellow form placed in chart (order not valid for inpatient use)     Chief Complaint  Patient presents with  . Acute Visit    blood in stool or urine?    HPI:  Pt is a 84 y.o. female seen today for an acute visit for off and on again "bright red blood" post bowel movements. She is not aware of the blood in her stool. Communication was with nursing in regards to findings. She has had this off and on again bleeding with stool for greater than 3 weeks per nurse. The blood is noted to be 1-3 ccs and can be seen at times on toilet paper. She herself reports no pain in the pelvis or abdomen. She denies trouble having a BM, but nurse states she notices the blood more often when she has a more formed and harder stool.  She denies feeling lightheaded or dizzy with moving. She has no known cause or reason for the bleeding. At time of most recent blood in BM she was stable with vital signs:  Pulse 68, BP 129/73. She denies shortness of breath or chest pain. She is without generalized pain.   Past Medical History:  Diagnosis Date  . Closed fracture of two ribs 10/21/2011, 08/24/2013  . Closed fracture of unspecified part of upper end of humerus 01/13/2005  . Fall 10-09-2011   fell outdoors on driveway fell and hit right side of body and face  . Female stress incontinence 02/10/2004  . Gait disorder 08/25/2013   Poor balance   . Herpes zoster without mention of complication 37/03/6268  . Hyperglycemia   . Hyperlipidemia   . Hypertension   . Hypothyroidism   . Keratosis seborrheica   . Left knee pain   . Leg pain   . Macular degeneration   . Malignant neoplasm of breast (female), unspecified site   . Multiple fractures of ribs of left side 08/24/2013  . Neoplasm of breast    right breast cancer  . Open wound of knee, leg (except thigh), and ankle, without mention of complication 4/85/4627  . Osteoporosis   . Other malaise and fatigue 01/06/2012  . Other specified circulatory system disorders 10/09/2011  . Pain in joint, pelvic region and thigh 05/11/2012  . Pain in joint, site unspecified 02/03/39  . Phlebitis and thrombophlebitis of other deep vessels of  lower extremities 01/06/2012  . Right shoulder injury 12/2009  . Shoulder pain, right 10/2009   X- rayed at Dr. Berenice Primas.Severe Degenerative Disease. Recommended an artificial shoulder. Saw Dr. Tamera Punt .  Marland Kitchen Varicose vein 2014  . Varicose veins   . Vitamin D deficiency    Past Surgical History:  Procedure Laterality Date  . ABDOMINAL HYSTERECTOMY  1967  . CATARACT EXTRACTION  04/2004   Dr. Bing Plume  . colon polyps  1989, 1992, 1995, 1997  . ENDOVENOUS ABLATION SAPHENOUS VEIN W/ LASER  01-30-2012   left greater saphenous vein and sclerotherapy left leg  .  FEMUR IM NAIL Right 07/02/2017   Procedure: INTRAMEDULLARY (IM) NAIL FEMORAL;  Surgeon: Gaynelle Arabian, MD;  Location: [facility];  Service: Orthopedics;  Laterality: Right;  . left cataract  1999  . MOLE REMOVAL  03/04/2009   on back by Dr. Derrel Nip  . right mastectomy  1989  . VESICOVAGINAL FISTULA CLOSURE W/ TAH  1967    Allergies  Allergen Reactions  . Levaquin [Levofloxacin In D5w] Diarrhea  . Mobic [Meloxicam]     Blood pressure goes up  . Tramadol     Blood pressure goes up     Outpatient Encounter Medications as of 08/26/2017  Medication Sig  . acetaminophen (TYLENOL) 325 MG tablet Take 650 mg by mouth every 6 (six) hours as needed for moderate pain.  . carvedilol (COREG) 3.125 MG tablet Take 1 tablet (3.125 mg total) by mouth 2 (two) times daily with a meal.  . divalproex (DEPAKOTE SPRINKLE) 125 MG capsule Take 375 mg by mouth daily. Every morning.  . divalproex (DEPAKOTE SPRINKLE) 125 MG capsule Take 250 mg by mouth every evening. Every evening.   . docusate sodium (COLACE) 100 MG capsule Take 100 mg by mouth 2 (two) times daily.  . furosemide (LASIX) 40 MG tablet Take 40 mg by mouth daily as needed.  Marland Kitchen HYDROcodone-acetaminophen (NORCO/VICODIN) 5-325 MG tablet Take 1 tablet by mouth every 6 (six) hours as needed for moderate pain.  Marland Kitchen ipratropium-albuterol (DUONEB) 0.5-2.5 (3) MG/3ML SOLN Take 3 mLs by nebulization every 6 (six) hours as needed (wheezing and sob).   Marland Kitchen levothyroxine (SYNTHROID, LEVOTHROID) 25 MCG tablet Take 1 tablet by mouth daily.  . Menthol, Topical Analgesic, (BIOFREEZE) 4 % GEL Apply 1 application topically 2 (two) times daily as needed (shoulder pain).   Marland Kitchen menthol-cetylpyridinium (CEPACOL) 3 MG lozenge Take 1 lozenge by mouth as needed for sore throat.  . Nutritional Supplements (BOOST BREEZE PO) Take by mouth. One can twice daily  . polyethylene glycol (MIRALAX / GLYCOLAX) packet Take 17 g by mouth daily.  . potassium chloride SA (K-DUR,KLOR-CON) 20 MEQ  tablet Take 40 mEq by mouth daily.  . sertraline (ZOLOFT) 100 MG tablet Take 1 tablet (100 mg total) by mouth daily.  . [DISCONTINUED] benzonatate (TESSALON PERLES) 100 MG capsule Take 100 mg by mouth at bedtime.   . [DISCONTINUED] Melatonin 10 MG TABS Take 10 mg by mouth at bedtime.    No facility-administered encounter medications on file as of 08/26/2017.     Review of Systems  Constitutional: Positive for activity change, appetite change and unexpected weight change. Negative for chills, fatigue and fever.  Respiratory: Negative for cough, chest tightness and shortness of breath.   Cardiovascular: Negative for chest pain, palpitations and leg swelling.  Gastrointestinal: Positive for blood in stool. Negative for abdominal distention, abdominal pain, anal bleeding and rectal pain.  Genitourinary: Negative.  Negative for hematuria and pelvic pain.  Skin: Positive for pallor.  Neurological: Negative for dizziness, weakness, light-headedness and headaches.  Hematological: Does not bruise/bleed easily.  Psychiatric/Behavioral: Negative.     Immunization History  Administered Date(s) Administered  . Influenza Whole 03/10/2012  . Influenza-Unspecified 03/28/2016   Pertinent  Health Maintenance Due  Topic Date Due  . PNA vac Low Risk Adult (1 of 2 - PCV13) 08/24/1989  . INFLUENZA VACCINE  01/08/2017  . DEXA SCAN  Completed   Fall Risk  05/28/2017 04/02/2017 07/10/2016 09/27/2015 04/11/2014  Falls in the past year? No No No No Yes  Number falls in past yr: - - - - 1  Injury with Fall? - - - - Yes  Comment - - - - Broken rib    Functional Status Survey:    Vitals:   08/26/17 1122  BP: 130/78  Pulse: 69  Resp: 18  Temp: 97.9 F (36.6 C)  TempSrc: Oral  SpO2: 96%  Weight: 89 lb (40.4 kg)   Body mass index is 19.26 kg/m. Physical Exam  Constitutional: She appears well-developed and well-nourished.  HENT:  Head: Normocephalic.  Cardiovascular: Normal rate, regular rhythm,  normal heart sounds and intact distal pulses.  Pulmonary/Chest: Effort normal and breath sounds normal.  Abdominal: Soft. Bowel sounds are normal. She exhibits no distension and no mass. There is no tenderness. There is no rebound and no guarding.  Neurological: She is alert.  Skin: Skin is warm and dry. No pallor.  Psychiatric: She has a normal mood and affect. Her behavior is normal.  Vitals reviewed.   Labs reviewed: Recent Labs    07/02/17 0602 07/03/17 0527 07/04/17 0510 07/07/17 07/14/17 0700  NA 140 140 138 142 139  K 4.1 4.2 3.8 4.0 4.3  CL 105 104 103  --   --   CO2 26 28 28   --   --   GLUCOSE 136* 123* 102*  --   --   BUN 29* 30* 31* 25* 12  CREATININE 0.94 0.99 0.93 0.5 0.6  CALCIUM 8.7* 8.2* 8.2*  --   --    Recent Labs    04/02/17 0700  AST 13  ALT 5*  ALKPHOS 57   Recent Labs    06/30/17 2056  07/02/17 0602 07/03/17 0527 07/04/17 0510 07/07/17 07/14/17 0700  WBC 9.7   < > 10.4 10.7* 8.7 8.1 8.6  NEUTROABS 8.3*  --   --   --   --   --   --   HGB 11.4*   < > 9.1* 7.9* 9.1* 9.8* 9.2*  HCT 34.4*   < > 27.5* 23.3* 26.5* 28* 28*  MCV 88.4   < > 88.4 88.9 88.9  --   --   PLT 154   < > 126* 128* 116* 178 255   < > = values in this interval not displayed.   Lab Results  Component Value Date   TSH 3.01 08/01/2016   Lab Results  Component Value Date   HGBA1C 5.6 07/14/2017   Lab Results  Component Value Date   CHOL 178 01/27/2014   HDL 93 (A) 01/27/2014   LDLCALC 74 01/27/2014   TRIG 41 01/27/2014    Significant Diagnostic Results in last 30 days:  No results found.  Assessment/Plan  1. Blood in stool She is having some "bright red blood" seen after stools. It is questionable as to if this is related to inner or external hemorrhoid. Will collect a CBC to assess Hgb status. She is on  Colace and Miralax. Will continue these and encourage increase of fluids as well.   2. Acute blood loss anemia She had an acute loss s/p hip repair in Jan. She  was given a unit of blood at that time and was noted to have a Hgb of 9.8 after the unit. She had a 9.8 Hgb on 07/14/2017. Do to the bleeding seen intermittently in stool and its on-going nature will assess CBC.     Family/ staff Communication: Spoke with nursing staff  Labs/tests ordered:  CBC

## 2017-10-06 LAB — CBC AND DIFFERENTIAL
HCT: 33 — AB (ref 36–46)
Hemoglobin: 10.7 — AB (ref 12.0–16.0)
Platelets: 255 (ref 150–399)
WBC: 9.9

## 2017-10-07 ENCOUNTER — Other Ambulatory Visit: Payer: Self-pay | Admitting: Internal Medicine

## 2017-10-07 ENCOUNTER — Encounter: Payer: Self-pay | Admitting: Internal Medicine

## 2017-10-07 ENCOUNTER — Non-Acute Institutional Stay (SKILLED_NURSING_FACILITY): Payer: Medicare Other | Admitting: Internal Medicine

## 2017-10-07 DIAGNOSIS — I5042 Chronic combined systolic (congestive) and diastolic (congestive) heart failure: Secondary | ICD-10-CM | POA: Diagnosis not present

## 2017-10-07 DIAGNOSIS — R634 Abnormal weight loss: Secondary | ICD-10-CM

## 2017-10-07 DIAGNOSIS — R918 Other nonspecific abnormal finding of lung field: Secondary | ICD-10-CM | POA: Diagnosis not present

## 2017-10-07 DIAGNOSIS — F419 Anxiety disorder, unspecified: Secondary | ICD-10-CM

## 2017-10-07 DIAGNOSIS — Z0000 Encounter for general adult medical examination without abnormal findings: Secondary | ICD-10-CM | POA: Diagnosis not present

## 2017-10-07 DIAGNOSIS — R0602 Shortness of breath: Secondary | ICD-10-CM

## 2017-10-07 DIAGNOSIS — F0151 Vascular dementia with behavioral disturbance: Secondary | ICD-10-CM

## 2017-10-07 DIAGNOSIS — F01518 Vascular dementia, unspecified severity, with other behavioral disturbance: Secondary | ICD-10-CM

## 2017-10-07 MED ORDER — LORAZEPAM 0.5 MG PO TABS: 0.2500 mg | ORAL_TABLET | Freq: Four times a day (QID) | ORAL | 0 refills | Status: AC | PRN

## 2017-10-07 NOTE — Progress Notes (Signed)
Patient ID: Tara Garrett, female   DOB: 1923-02-09, 84 y.o.   MRN: [id number]  Provider:  Rexene Edison. Mariea Clonts, D.O., C.M.D. Location: [facility] Room Number: 630 Place of Service:  SNF (31)   PCP: [name], DO Patient Care Team: [name], DO as PCP - General (Geriatric Medicine) [name], MD (Orthopedic Surgery) [name], MD (Ophthalmology) [facility] Arlyn Dunning, MD as Consulting Physician (Cardiology)  Extended Emergency Contact Information Primary Emergency Contact: [name],B J Address: [address]          [name] [address] [name] Phone: [phone] Mobile Phone: [phone] Relation: Friend  Code Status: DNR Goals of Care: Advanced Directive information Advanced Directives 10/07/2017  Does Patient Have a Medical Advance Directive? Yes  Type of Paramedic of [place];Living will;Out of facility DNR (pink MOST or yellow form)  Does patient want to make changes to medical advance directive? No - Patient declined  Copy of [place] in Chart? Yes  Pre-existing out of facility DNR order (yellow form or pink MOST form) Yellow form placed in chart (order not valid for inpatient use)     Chief Complaint  Patient presents with  . Annual Exam    CPE    HPI: Patient is a 84 y.o. female seen today for an annual comprehensive examination.  She was having problems last night and today with increased shortness of breath and wheezing.  She's had intermittent episodes of this and seems to improve with use of nebs.  She gets very anxious during the episodes and staff asked if ativan could be tried to calm her.  She is wanting more company with a sitter and BJ has agreed to arrange for longer hours of help.  She appeared pale to me but her hgb has actually trended up from 8.5 3/20 to 10.7 on 4/29.  She'd been c/o left side pain to nursing and got a  pain pill, but she reported left buttock pain to me from sitting too much.  She does have a heel wound but no known buttock wounds.  Nursing was reassessing her buttocks after my visit due to this concern.  Her lung mass on xrays persists.  She had been losing weight again (down from 128 supposedly on 1/25 to 89.8 on 3/14, but oddly other weights weights prior to the 128 were in the upper 90s so doubt 128 was right. She's now up to 99 lbs.  Intake is poor.  She likes boost shakes and was all for having them with ice cream added as a true milkshake.   Filed Weights   10/07/17 1452  Weight: 99 lb (44.9 kg)    Past Medical History:  Diagnosis Date  . Closed fracture of two ribs 10/21/2011, 08/24/2013  . Closed fracture of unspecified part of upper end of humerus 01/13/2005  . Fall 10-09-2011   fell outdoors on driveway fell and hit right side of body and face  . Female stress incontinence 02/10/2004  . Gait disorder 08/25/2013   Poor balance   . Herpes zoster without mention of complication 76/28/3151  . Hyperglycemia   . Hyperlipidemia   . Hypertension   . Hypothyroidism   . Keratosis seborrheica   . Left knee pain   . Leg pain   . Macular degeneration   . Malignant neoplasm of breast (female), unspecified site   . Multiple fractures of ribs of left side 08/24/2013  .  Neoplasm of breast    right breast cancer  . Open wound of knee, leg (except thigh), and ankle, without mention of complication 0/35/4656  . Osteoporosis   . Other malaise and fatigue 01/06/2012  . Other specified circulatory system disorders 10/09/2011  . Pain in joint, pelvic region and thigh 05/11/2012  . Pain in joint, site unspecified 02/03/39  . Phlebitis and thrombophlebitis of other deep vessels of lower extremities 01/06/2012  . Right shoulder injury 12/2009  . Shoulder pain, right 10/2009   X- rayed at Dr. Berenice Primas.Severe Degenerative Disease. Recommended an artificial shoulder. Saw Dr. Tamera Punt .  Marland Kitchen Varicose vein 2014  .  Varicose veins   . Vitamin D deficiency    Past Surgical History:  Procedure Laterality Date  . ABDOMINAL HYSTERECTOMY  1967  . CATARACT EXTRACTION  04/2004   Dr. Bing Plume  . colon polyps  1989, 1992, 1995, 1997  . ENDOVENOUS ABLATION SAPHENOUS VEIN W/ LASER  01-30-2012   left greater saphenous vein and sclerotherapy left leg  . FEMUR IM NAIL Right 07/02/2017   Procedure: INTRAMEDULLARY (IM) NAIL FEMORAL;  Surgeon: Gaynelle Arabian, MD;  Location: [facility];  Service: Orthopedics;  Laterality: Right;  . left cataract  1999  . MOLE REMOVAL  03/04/2009   on back by Dr. Derrel Nip  . right mastectomy  1989  . VESICOVAGINAL FISTULA CLOSURE W/ TAH  1967    reports that she quit smoking about 39 years ago. Her smoking use included cigarettes. She has a 10.00 pack-year smoking history. She has never used smokeless tobacco. She reports that she drinks alcohol. She reports that she does not use drugs. Social History   Socioeconomic History  . Marital status: Single    Spouse name: Not on file  . Number of children: Not on file  . Years of education: Not on file  . Highest education level: Not on file  Occupational History  . Not on file  Social Needs  . Financial resource strain: Not on file  . Food insecurity:    Worry: Not on file    Inability: Not on file  . Transportation needs:    Medical: Not on file    Non-medical: Not on file  Tobacco Use  . Smoking status: Former Smoker    Packs/day: 0.50    Years: 20.00    Pack years: 10.00    Types: Cigarettes    Last attempt to quit: 06/10/1978    Years since quitting: 39.3  . Smokeless tobacco: Never Used  Substance and Sexual Activity  . Alcohol use: Yes    Alcohol/week: 0.0 - 0.6 oz    Comment: 8 oz of wine per day  . Drug use: No  . Sexual activity: Not on file  Lifestyle  . Physical activity:    Days per week: Not on file    Minutes per session: Not on file  . Stress: Not on file  Relationships  . Social connections:    Talks  on phone: Not on file    Gets together: Not on file    Attends religious service: Not on file    Active member of club or organization: Not on file    Attends meetings of clubs or organizations: Not on file    Relationship status: Not on file  Other Topics Concern  . Not on file  Social History Narrative   Patient is Single.Occupation   Lives in single level home, Independent Living section at [facility] since 1995  Quit smoking 1980, drinks  Alcohol     Patient has Advanced planning documents:  DNR, HCPOA   Exercise class 45 minutes 3 times a week   Walks with walker            Family History  Problem Relation Age of Onset  . Cancer Mother        ? type    Pertinent  Health Maintenance Due  Topic Date Due  . PNA vac Low Risk Adult (1 of 2 - PCV13) 08/24/1989  . INFLUENZA VACCINE  01/08/2018  . DEXA SCAN  Completed   Fall Risk  05/28/2017 04/02/2017 07/10/2016 09/27/2015 04/11/2014  Falls in the past year? No No No No Yes  Number falls in past yr: - - - - 1  Injury with Fall? - - - - Yes  Comment - - - - Broken rib    Depression screen [facility] 2/9 05/28/2017 04/02/2017 07/10/2016 09/27/2015 04/11/2014  Decreased Interest 0 0 0 0 0  Down, Depressed, Hopeless 0 0 0 0 0  PHQ - 2 Score 0 0 0 0 0    Functional Status Survey:    Allergies  Allergen Reactions  . Levaquin [Levofloxacin In D5w] Diarrhea  . Mobic [Meloxicam]     Blood pressure goes up  . Tramadol     Blood pressure goes up     Outpatient Encounter Medications as of 10/07/2017  Medication Sig  . acetaminophen (TYLENOL) 325 MG tablet Take 650 mg by mouth every 6 (six) hours as needed for moderate pain.  . carvedilol (COREG) 3.125 MG tablet Take 1 tablet (3.125 mg total) by mouth 2 (two) times daily with a meal.  . divalproex (DEPAKOTE SPRINKLE) 125 MG capsule Take 375 mg by mouth daily. Every morning.  . divalproex (DEPAKOTE SPRINKLE) 125 MG capsule Take 250 mg by mouth every evening. Every  evening.   . docusate sodium (COLACE) 100 MG capsule Take 100 mg by mouth 2 (two) times daily.  . ferrous sulfate 325 (65 FE) MG tablet Take 325 mg by mouth 2 (two) times daily with a meal.  . furosemide (LASIX) 40 MG tablet Take 40 mg by mouth daily as needed.  Marland Kitchen HYDROcodone-acetaminophen (NORCO/VICODIN) 5-325 MG tablet Take 1 tablet by mouth every 6 (six) hours as needed for moderate pain.  Marland Kitchen ipratropium-albuterol (DUONEB) 0.5-2.5 (3) MG/3ML SOLN Take 3 mLs by nebulization every 6 (six) hours as needed (wheezing and sob).   Marland Kitchen levothyroxine (SYNTHROID, LEVOTHROID) 25 MCG tablet Take 1 tablet by mouth daily.  Marland Kitchen LORazepam (ATIVAN) 0.5 MG tablet Take 0.5 tablets (0.25 mg total) by mouth every 6 (six) hours as needed for anxiety.  . Menthol, Topical Analgesic, (BIOFREEZE) 4 % GEL Apply 1 application topically 2 (two) times daily as needed (shoulder pain).   Marland Kitchen menthol-cetylpyridinium (CEPACOL) 3 MG lozenge Take 1 lozenge by mouth as needed for sore throat.  . Nutritional Supplements (BOOST BREEZE PO) Take by mouth. One can twice daily  . polyethylene glycol (MIRALAX / GLYCOLAX) packet Take 17 g by mouth daily.  . potassium chloride SA (K-DUR,KLOR-CON) 20 MEQ tablet Take 40 mEq by mouth daily.  . sertraline (ZOLOFT) 100 MG tablet Take 1 tablet (100 mg total) by mouth daily.   No facility-administered encounter medications on file as of 10/07/2017.     Review of Systems  Constitutional: Positive for activity change, appetite change, fatigue and unexpected weight change. Negative for chills and fever.  HENT: Negative for congestion.   Eyes:  Negative for visual disturbance.  Respiratory: Positive for shortness of breath and wheezing. Negative for cough, choking and chest tightness.   Cardiovascular: Negative for chest pain, palpitations and leg swelling.  Gastrointestinal: Positive for abdominal pain. Negative for constipation, diarrhea, nausea and vomiting.  Genitourinary: Negative for dysuria.    Musculoskeletal: Positive for arthralgias and gait problem. Negative for back pain.  Skin: Positive for color change and pallor.  Neurological: Positive for weakness.  Hematological: Bruises/bleeds easily.  Psychiatric/Behavioral: Positive for agitation, behavioral problems, confusion and sleep disturbance. Negative for suicidal ideas. The patient is nervous/anxious.     Vitals:   10/07/17 1452  BP: 137/86  Pulse: 78  Resp: 17  Temp: (!) 97.5 F (36.4 C)  TempSrc: Oral  SpO2: 93%  Weight: 99 lb (44.9 kg)  Height: 4\' 9"  (1.448 m)   Body mass index is 21.42 kg/m. Physical Exam  Constitutional:  Cachectic female seated up in wheelchair finishing a muffin  HENT:  Head: Normocephalic and atraumatic.  Right Ear: External ear normal.  Left Ear: External ear normal.  Nose: Nose normal.  Mouth/Throat: Oropharynx is clear and moist.  Eyes: Pupils are equal, round, and reactive to light. Conjunctivae are normal.  Neck: No JVD present.  Cardiovascular: Normal rate, regular rhythm, normal heart sounds and intact distal pulses.  Pulmonary/Chest: Effort normal. She has wheezes.  Wearing O2, appeared more comfortable after I starting talking with her  Abdominal: Soft. Bowel sounds are normal. She exhibits no distension and no mass. There is tenderness. There is no rebound and no guarding.  LLQ region  Musculoskeletal: Normal range of motion.  Muscle wasting evident  Neurological: She is alert.  Skin: Skin is warm and dry.  Heel protectors in place with right afo device on   Psychiatric:  More confused than baseline    Labs reviewed: Basic Metabolic Panel: Recent Labs    07/02/17 0602 07/03/17 0527 07/04/17 0510 07/07/17 07/14/17 0700  NA 140 140 138 142 139  K 4.1 4.2 3.8 4.0 4.3  CL 105 104 103  --   --   CO2 26 28 28   --   --   GLUCOSE 136* 123* 102*  --   --   BUN 29* 30* 31* 25* 12  CREATININE 0.94 0.99 0.93 0.5 0.6  CALCIUM 8.7* 8.2* 8.2*  --   --    Liver  Function Tests: Recent Labs    04/02/17 0700  AST 13  ALT 5*  ALKPHOS 57   No results for input(s): LIPASE, AMYLASE in the last 8760 hours. No results for input(s): AMMONIA in the last 8760 hours. CBC: Recent Labs    06/30/17 2056  07/02/17 0602 07/03/17 0527 07/04/17 0510 07/07/17 07/14/17 0700 10/06/17 0800  WBC 9.7   < > 10.4 10.7* 8.7 8.1 8.6 9.9  NEUTROABS 8.3*  --   --   --   --   --   --   --   HGB 11.4*   < > 9.1* 7.9* 9.1* 9.8* 9.2* 10.7*  HCT 34.4*   < > 27.5* 23.3* 26.5* 28* 28* 33*  MCV 88.4   < > 88.4 88.9 88.9  --   --   --   PLT 154   < > 126* 128* 116* 178 255 255   < > = values in this interval not displayed.   Cardiac Enzymes: Recent Labs    07/03/17 0822 07/03/17 1330 07/03/17 2032  TROPONINI 0.04* 0.04* 0.04*   BNP: Invalid  input(s): POCBNP Lab Results  Component Value Date   HGBA1C 5.6 07/14/2017   Lab Results  Component Value Date   TSH 3.01 08/01/2016    Assessment/Plan 1. Shortness of breath -treat with regular nebs at least for a few days -concern that mass is growing and causing worsening symptoms and progressive weight loss, but Vickii Chafe is also overall quite frail since her hip fracture -anemia remains an issue but has improved to 10.7 from 8.5  2. Mass of lung -ongoing, more dyspnea lately and may need hospice care again if no other source of infection found to easily treat and reverse  3. Chronic combined systolic and diastolic CHF (congestive heart failure) (HCC) -has prn lasix for weight gain, but weight is plummeting Wt Readings from Last 3 Encounters:  10/13/17 95 lb 3.2 oz (43.2 kg)  10/07/17 99 lb (44.9 kg)  08/26/17 89 lb (40.4 kg)   4. Vascular dementia with behavioral disturbance -progressing also, continues on depakote sprinkles due to prior combative behavior with other residents and staff; has ativan for anxiety (more related to her dyspnea)   5. Weight loss -as above, seems to be declining overall, but has some  new symptoms and has labs pending to r/o acute infection or chf exacerbation but if unremarkable and intake remains poor, she would qualify to go back on hospice  6.  Annual exam: performed today -pt acutely ill and has refused health maintenance vaccinations (pneumonia and tdap and shingrix) that are due  Family/ staff Communication:   Discussed with SNF nurse  Labs/tests ordered:  Increased sitter hours, nebs, await remaining outstanding labs   Vrishank Moster L. Emsley Custer, D.O. [facility] [address], [place] [address] Cell Phone (Mon-Fri 8am-5pm):  [phone] On Call:  [phone] & follow prompts after 5pm & weekends Office Phone:  [phone] Office Fax:  [phone]

## 2017-10-13 ENCOUNTER — Non-Acute Institutional Stay (SKILLED_NURSING_FACILITY): Payer: Medicare Other | Admitting: Adult Health

## 2017-10-13 ENCOUNTER — Encounter: Payer: Self-pay | Admitting: Adult Health

## 2017-10-13 DIAGNOSIS — R5383 Other fatigue: Secondary | ICD-10-CM

## 2017-10-13 DIAGNOSIS — F01518 Vascular dementia, unspecified severity, with other behavioral disturbance: Secondary | ICD-10-CM

## 2017-10-13 DIAGNOSIS — R638 Other symptoms and signs concerning food and fluid intake: Secondary | ICD-10-CM | POA: Diagnosis not present

## 2017-10-13 DIAGNOSIS — F0151 Vascular dementia with behavioral disturbance: Secondary | ICD-10-CM

## 2017-10-13 DIAGNOSIS — R918 Other nonspecific abnormal finding of lung field: Secondary | ICD-10-CM

## 2017-10-13 DIAGNOSIS — L89612 Pressure ulcer of right heel, stage 2: Secondary | ICD-10-CM

## 2017-10-13 NOTE — Progress Notes (Signed)
Location:  Occupational psychologist of Service:  SNF (31) Provider:   Cindi Carbon, ANP [place] [phone]   [name], DO  Patient Care Team: [name], DO as PCP - General (Geriatric Medicine) [name], MD (Orthopedic Surgery) [name], MD (Ophthalmology) [facility] Arlyn Dunning, MD as Consulting Physician (Cardiology)  Extended Emergency Contact Information Primary Emergency Contact: [name],B J Address: [address]          [name] [address] [name] of [place] Phone: [phone] Mobile Phone: [phone] Relation: Friend  Code Status: DNR Goals of care: Advanced Directive information Advanced Directives 10/07/2017  Does Patient Have a Medical Advance Directive? Yes  Type of Paramedic of [place];Living will;Out of facility DNR (pink MOST or yellow form)  Does patient want to make changes to medical advance directive? No - Patient declined  Copy of [place] in Chart? Yes  Pre-existing out of facility DNR order (yellow form or pink MOST form) Yellow form placed in chart (order not valid for inpatient use)     Chief Complaint  Patient presents with  . Acute Visit    lethargic/?hospice    HPI:  Pt is a 84 y.o. female seen today for an acute visit for lethargy and evaluation for hospice care. Ms. Cullom has a hx of right lung mass and was previously followed by hospice but "graduated" 2 years ago. She resided in [place] until she fell and fractured her hip in January of 2019.  Due to her dementia she had difficulty in participating in therapy and therefore never recovered or walked again. She moved to skilled are and has been declining. The nurse reports she is lethargic and not able to get out of bed. She is not eating well either. Her family has requested hospice again.   The lung mass has grown over time and measured 7.9cm x 4.8 cm in the  right lung in 2018. No new films have been taken. She appears slightly short of breath and has had some pain on the right side. She also reports her right heel hurts. There has been an area of pressure to the right heel (developed after her hip fx) that has been difficult to heal. There is some yellow drainage and redness per the nursing staff.    12/06/16: CXR 7.9 cm x 4.8 cm mass like density in the right lung field which now appears to contact the right hilum Past Medical History:  Diagnosis Date  . Closed fracture of two ribs 10/21/2011, 08/24/2013  . Closed fracture of unspecified part of upper end of humerus 01/13/2005  . Fall 10-09-2011   fell outdoors on driveway fell and hit right side of body and face  . Female stress incontinence 02/10/2004  . Gait disorder 08/25/2013   Poor balance   . Herpes zoster without mention of complication 11/94/1740  . Hyperglycemia   . Hyperlipidemia   . Hypertension   . Hypothyroidism   . Keratosis seborrheica   . Left knee pain   . Leg pain   . Macular degeneration   . Malignant neoplasm of breast (female), unspecified site   . Multiple fractures of ribs of left side 08/24/2013  . Neoplasm of breast    right breast cancer  . Open wound of knee, leg (except thigh), and ankle, without mention of complication 01/21/4817  . Osteoporosis   . Other malaise and fatigue 01/06/2012  . Other specified  circulatory system disorders 10/09/2011  . Pain in joint, pelvic region and thigh 05/11/2012  . Pain in joint, site unspecified 02/03/39  . Phlebitis and thrombophlebitis of other deep vessels of lower extremities 01/06/2012  . Right shoulder injury 12/2009  . Shoulder pain, right 10/2009   X- rayed at Dr. Berenice Primas.Severe Degenerative Disease. Recommended an artificial shoulder. Saw Dr. Tamera Punt .  Marland Kitchen Varicose vein 2014  . Varicose veins   . Vitamin D deficiency    Past Surgical History:  Procedure Laterality Date  . ABDOMINAL HYSTERECTOMY  1967  . CATARACT  EXTRACTION  04/2004   Dr. Bing Plume  . colon polyps  1989, 1992, 1995, 1997  . ENDOVENOUS ABLATION SAPHENOUS VEIN W/ LASER  01-30-2012   left greater saphenous vein and sclerotherapy left leg  . FEMUR IM NAIL Right 07/02/2017   Procedure: INTRAMEDULLARY (IM) NAIL FEMORAL;  Surgeon: Gaynelle Arabian, MD;  Location: [facility];  Service: Orthopedics;  Laterality: Right;  . left cataract  1999  . MOLE REMOVAL  03/04/2009   on back by Dr. Derrel Nip  . right mastectomy  1989  . VESICOVAGINAL FISTULA CLOSURE W/ TAH  1967    Allergies  Allergen Reactions  . Levaquin [Levofloxacin In D5w] Diarrhea  . Mobic [Meloxicam]     Blood pressure goes up  . Tramadol     Blood pressure goes up     Outpatient Encounter Medications as of 10/13/2017  Medication Sig  . acetaminophen (TYLENOL) 325 MG tablet Take 650 mg by mouth every 6 (six) hours as needed for moderate pain.  . carvedilol (COREG) 3.125 MG tablet Take 1 tablet (3.125 mg total) by mouth 2 (two) times daily with a meal.  . divalproex (DEPAKOTE SPRINKLE) 125 MG capsule Take 375 mg by mouth daily. Every morning.  . divalproex (DEPAKOTE SPRINKLE) 125 MG capsule Take 250 mg by mouth every evening. Every evening.   . docusate sodium (COLACE) 100 MG capsule Take 100 mg by mouth 2 (two) times daily.  . ferrous sulfate 325 (65 FE) MG tablet Take 325 mg by mouth 2 (two) times daily with a meal.  . furosemide (LASIX) 40 MG tablet Take 40 mg by mouth daily as needed.  Marland Kitchen HYDROcodone-acetaminophen (NORCO/VICODIN) 5-325 MG tablet Take 1 tablet by mouth every 6 (six) hours as needed for moderate pain.  Marland Kitchen ipratropium-albuterol (DUONEB) 0.5-2.5 (3) MG/3ML SOLN Take 3 mLs by nebulization every 6 (six) hours as needed (wheezing and sob).   Marland Kitchen levothyroxine (SYNTHROID, LEVOTHROID) 25 MCG tablet Take 1 tablet by mouth daily.  Marland Kitchen LORazepam (ATIVAN) 0.5 MG tablet Take 0.5 tablets (0.25 mg total) by mouth every 6 (six) hours as needed for anxiety.  . Menthol, Topical  Analgesic, (BIOFREEZE) 4 % GEL Apply 1 application topically 2 (two) times daily as needed (shoulder pain).   Marland Kitchen menthol-cetylpyridinium (CEPACOL) 3 MG lozenge Take 1 lozenge by mouth as needed for sore throat.  . Nutritional Supplements (BOOST BREEZE PO) Take by mouth. One can twice daily  . polyethylene glycol (MIRALAX / GLYCOLAX) packet Take 17 g by mouth daily.  . potassium chloride SA (K-DUR,KLOR-CON) 20 MEQ tablet Take 40 mEq by mouth daily.  . sertraline (ZOLOFT) 100 MG tablet Take 1 tablet (100 mg total) by mouth daily.   No facility-administered encounter medications on file as of 10/13/2017.     Review of Systems  Unable to perform ROS: Dementia    Immunization History  Administered Date(s) Administered  . Influenza Whole 03/10/2012  . Influenza-Unspecified 03/28/2016  Pertinent  Health Maintenance Due  Topic Date Due  . PNA vac Low Risk Adult (1 of 2 - PCV13) 08/24/1989  . INFLUENZA VACCINE  01/08/2018  . DEXA SCAN  Completed   Fall Risk  05/28/2017 04/02/2017 07/10/2016 09/27/2015 04/11/2014  Falls in the past year? No No No No Yes  Number falls in past yr: - - - - 1  Injury with Fall? - - - - Yes  Comment - - - - Broken rib    Functional Status Survey:    Vitals:   10/13/17 1438  BP: 111/78  Pulse: 96  Resp: 20  Temp: (!) 97.1 F (36.2 C)  SpO2: 98%  Weight: 95 lb 3.2 oz (43.2 kg)   Body mass index is 20.6 kg/m. Physical Exam  Constitutional: She appears distressed (slight).  Frail and pale  HENT:  Head: Normocephalic and atraumatic.  Nose: Nose normal.  Mouth/Throat: Oropharynx is clear and moist. No oropharyngeal exudate.  Eyes: Pupils are equal, round, and reactive to light. Conjunctivae are normal. Right eye exhibits no discharge. Left eye exhibits no discharge.  Neck: No JVD present.  Cardiovascular: Normal rate and regular rhythm.  Murmur heard. Bounding heart sounds  Pulmonary/Chest: She has no wheezes. She has no rales.  Slightly increased  wob with decreased bs on the right  Abdominal: Soft. Bowel sounds are normal.  Musculoskeletal: She exhibits tenderness (right heel). She exhibits no edema.  Neurological:  Arouses to verbal stim but falls back to sleep. Oriented to self only and intermittently able to f/c.  Skin: Skin is warm and dry. She is not diaphoretic. There is erythema (right heel wound with yellow wound bed, surrounding erythema and very tender with yellow drainage).  Psychiatric: She has a normal mood and affect.  Nursing note and vitals reviewed.   Labs reviewed: Recent Labs    07/02/17 0602 07/03/17 0527 07/04/17 0510 07/07/17 07/14/17 0700  NA 140 140 138 142 139  K 4.1 4.2 3.8 4.0 4.3  CL 105 104 103  --   --   CO2 26 28 28   --   --   GLUCOSE 136* 123* 102*  --   --   BUN 29* 30* 31* 25* 12  CREATININE 0.94 0.99 0.93 0.5 0.6  CALCIUM 8.7* 8.2* 8.2*  --   --    Recent Labs    04/02/17 0700  AST 13  ALT 5*  ALKPHOS 57   Recent Labs    06/30/17 2056  07/02/17 0602 07/03/17 0527 07/04/17 0510 07/07/17 07/14/17 0700 10/06/17 0800  WBC 9.7   < > 10.4 10.7* 8.7 8.1 8.6 9.9  NEUTROABS 8.3*  --   --   --   --   --   --   --   HGB 11.4*   < > 9.1* 7.9* 9.1* 9.8* 9.2* 10.7*  HCT 34.4*   < > 27.5* 23.3* 26.5* 28* 28* 33*  MCV 88.4   < > 88.4 88.9 88.9  --   --   --   PLT 154   < > 126* 128* 116* 178 255 255   < > = values in this interval not displayed.   Lab Results  Component Value Date   TSH 3.01 08/01/2016   Lab Results  Component Value Date   HGBA1C 5.6 07/14/2017   Lab Results  Component Value Date   CHOL 178 01/27/2014   HDL 93 (A) 01/27/2014   LDLCALC 74 01/27/2014   TRIG 41 01/27/2014  Significant Diagnostic Results in last 30 days:  No results found.  Assessment/Plan  1. Lung mass I think she is an appropriate candidate for hospice given her hx of lung mass which has demonstrated growth on CXR in 2018. She now is having some sob and pain. Her goals of care comfort  based and her family wishes for hospice care. Will add roxanol 5 mg q 2 hrs prn pain/sob and consult with hospice due to lung mass, weight loss, lethargy, dementia, and chronic wound.    2. Poor fluid intake Due to lethargy and weakness, also with progressive dementia.  3. Lethargy Due to poor intake and lung mass  4. Vascular dementia with behavioral disturbance Progressive decline in cognition and function  Hospice consulted  5. Pressure injury of right heel, stage 2 May have signs of infection but she appears to be nearing the end of life and would not benefit from a trial of antibiotic Continue dressing changes and keep clean and dry Pressure relief measures in place   Family/ staff Communication: discussed with nurse  Labs/tests ordered:  NA

## 2017-11-08 DEATH — deceased
